# Patient Record
Sex: Female | Born: 1935 | Race: Black or African American | Hispanic: No | Smoking: Never smoker
Health system: Southern US, Community
[De-identification: ages and names within clinical notes are randomized; demographics above are authoritative.]

## PROBLEM LIST (undated history)

## (undated) DIAGNOSIS — K219 Gastro-esophageal reflux disease without esophagitis: Secondary | ICD-10-CM

## (undated) DIAGNOSIS — J9690 Respiratory failure, unspecified, unspecified whether with hypoxia or hypercapnia: Secondary | ICD-10-CM

## (undated) DIAGNOSIS — I10 Essential (primary) hypertension: Secondary | ICD-10-CM

## (undated) DIAGNOSIS — M4712 Other spondylosis with myelopathy, cervical region: Secondary | ICD-10-CM

## (undated) DIAGNOSIS — N390 Urinary tract infection, site not specified: Secondary | ICD-10-CM

## (undated) DIAGNOSIS — H4010X0 Unspecified open-angle glaucoma, stage unspecified: Secondary | ICD-10-CM

## (undated) DIAGNOSIS — M549 Dorsalgia, unspecified: Secondary | ICD-10-CM

## (undated) DIAGNOSIS — E559 Vitamin D deficiency, unspecified: Secondary | ICD-10-CM

## (undated) DIAGNOSIS — F0390 Unspecified dementia without behavioral disturbance: Secondary | ICD-10-CM

## (undated) DIAGNOSIS — F323 Major depressive disorder, single episode, severe with psychotic features: Secondary | ICD-10-CM

## (undated) DIAGNOSIS — G4700 Insomnia, unspecified: Secondary | ICD-10-CM

## (undated) DIAGNOSIS — E669 Obesity, unspecified: Secondary | ICD-10-CM

## (undated) DIAGNOSIS — E785 Hyperlipidemia, unspecified: Secondary | ICD-10-CM

## (undated) DIAGNOSIS — F209 Schizophrenia, unspecified: Secondary | ICD-10-CM

## (undated) DIAGNOSIS — M109 Gout, unspecified: Secondary | ICD-10-CM

## (undated) DIAGNOSIS — M1710 Unilateral primary osteoarthritis, unspecified knee: Secondary | ICD-10-CM

## (undated) DIAGNOSIS — N3020 Other chronic cystitis without hematuria: Secondary | ICD-10-CM

## (undated) DIAGNOSIS — H04129 Dry eye syndrome of unspecified lacrimal gland: Secondary | ICD-10-CM

## (undated) DIAGNOSIS — F329 Major depressive disorder, single episode, unspecified: Secondary | ICD-10-CM

## (undated) DIAGNOSIS — F32A Depression, unspecified: Secondary | ICD-10-CM

## (undated) DIAGNOSIS — D649 Anemia, unspecified: Secondary | ICD-10-CM

## (undated) DIAGNOSIS — M4716 Other spondylosis with myelopathy, lumbar region: Secondary | ICD-10-CM

## (undated) DIAGNOSIS — N19 Unspecified kidney failure: Secondary | ICD-10-CM

## (undated) DIAGNOSIS — L89899 Pressure ulcer of other site, unspecified stage: Secondary | ICD-10-CM

## (undated) DIAGNOSIS — S42209A Unspecified fracture of upper end of unspecified humerus, initial encounter for closed fracture: Secondary | ICD-10-CM

## (undated) DIAGNOSIS — J189 Pneumonia, unspecified organism: Secondary | ICD-10-CM

## (undated) DIAGNOSIS — G2581 Restless legs syndrome: Secondary | ICD-10-CM

## (undated) DIAGNOSIS — E039 Hypothyroidism, unspecified: Secondary | ICD-10-CM

## (undated) DIAGNOSIS — E875 Hyperkalemia: Secondary | ICD-10-CM

## (undated) DIAGNOSIS — G8929 Other chronic pain: Secondary | ICD-10-CM

## (undated) DIAGNOSIS — M25539 Pain in unspecified wrist: Secondary | ICD-10-CM

## (undated) DIAGNOSIS — S7292XA Unspecified fracture of left femur, initial encounter for closed fracture: Secondary | ICD-10-CM

## (undated) DIAGNOSIS — G252 Other specified forms of tremor: Secondary | ICD-10-CM

## (undated) DIAGNOSIS — G894 Chronic pain syndrome: Secondary | ICD-10-CM

## (undated) DIAGNOSIS — K649 Unspecified hemorrhoids: Secondary | ICD-10-CM

## (undated) DIAGNOSIS — M25579 Pain in unspecified ankle and joints of unspecified foot: Secondary | ICD-10-CM

## (undated) DIAGNOSIS — S72009A Fracture of unspecified part of neck of unspecified femur, initial encounter for closed fracture: Secondary | ICD-10-CM

## (undated) DIAGNOSIS — M353 Polymyalgia rheumatica: Secondary | ICD-10-CM

## (undated) HISTORY — DX: Vitamin D deficiency, unspecified: E55.9

## (undated) HISTORY — DX: Schizophrenia, unspecified: F20.9

## (undated) HISTORY — DX: Respiratory failure, unspecified, unspecified whether with hypoxia or hypercapnia: J96.90

## (undated) HISTORY — PX: UMBILICAL HERNIA REPAIR: SHX196

## (undated) HISTORY — DX: Unspecified open-angle glaucoma, stage unspecified: H40.10X0

## (undated) HISTORY — DX: Unspecified kidney failure: N19

## (undated) HISTORY — DX: Polymyalgia rheumatica: M35.3

## (undated) HISTORY — DX: Chronic pain syndrome: G89.4

## (undated) HISTORY — DX: Gastro-esophageal reflux disease without esophagitis: K21.9

## (undated) HISTORY — DX: Depression, unspecified: F32.A

## (undated) HISTORY — PX: POSTERIOR LAMINECTOMY / DECOMPRESSION LUMBAR SPINE: SUR740

## (undated) HISTORY — DX: Pneumonia, unspecified organism: J18.9

## (undated) HISTORY — DX: Urinary tract infection, site not specified: N39.0

## (undated) HISTORY — DX: Unilateral primary osteoarthritis, unspecified knee: M17.10

## (undated) HISTORY — DX: Pain in unspecified ankle and joints of unspecified foot: M25.579

## (undated) HISTORY — DX: Hyperlipidemia, unspecified: E78.5

## (undated) HISTORY — DX: Restless legs syndrome: G25.81

## (undated) HISTORY — DX: Pressure ulcer of other site, unspecified stage: L89.899

## (undated) HISTORY — DX: Essential (primary) hypertension: I10

## (undated) HISTORY — DX: Hyperkalemia: E87.5

## (undated) HISTORY — DX: Major depressive disorder, single episode, severe with psychotic features: F32.3

## (undated) HISTORY — DX: Unspecified dementia, unspecified severity, without behavioral disturbance, psychotic disturbance, mood disturbance, and anxiety: F03.90

## (undated) HISTORY — DX: Other specified forms of tremor: G25.2

## (undated) HISTORY — DX: Unspecified hemorrhoids: K64.9

## (undated) HISTORY — DX: Insomnia, unspecified: G47.00

## (undated) HISTORY — DX: Obesity, unspecified: E66.9

## (undated) HISTORY — DX: Dry eye syndrome of unspecified lacrimal gland: H04.129

## (undated) HISTORY — DX: Anemia, unspecified: D64.9

## (undated) HISTORY — DX: Fracture of unspecified part of neck of unspecified femur, initial encounter for closed fracture: S72.009A

## (undated) HISTORY — DX: Other spondylosis with myelopathy, cervical region: M47.12

## (undated) HISTORY — DX: Unspecified fracture of left femur, initial encounter for closed fracture: S72.92XA

## (undated) HISTORY — PX: TOTAL ABDOMINAL HYSTERECTOMY: SHX209

## (undated) HISTORY — DX: Major depressive disorder, single episode, unspecified: F32.9

## (undated) HISTORY — DX: Other spondylosis with myelopathy, lumbar region: M47.16

## (undated) HISTORY — DX: Hypothyroidism, unspecified: E03.9

## (undated) HISTORY — DX: Unspecified fracture of upper end of unspecified humerus, initial encounter for closed fracture: S42.209A

## (undated) HISTORY — DX: Gout, unspecified: M10.9

## (undated) HISTORY — DX: Other chronic pain: G89.29

## (undated) HISTORY — DX: Dorsalgia, unspecified: M54.9

## (undated) HISTORY — DX: Other chronic cystitis without hematuria: N30.20

## (undated) HISTORY — DX: Pain in unspecified wrist: M25.539

---

## 2000-01-22 ENCOUNTER — Encounter: Admission: RE | Admit: 2000-01-22 | Discharge: 2000-03-15 | Payer: Self-pay | Admitting: Family Medicine

## 2000-02-22 ENCOUNTER — Ambulatory Visit (HOSPITAL_COMMUNITY): Admission: RE | Admit: 2000-02-22 | Discharge: 2000-02-22 | Payer: Self-pay | Admitting: Family Medicine

## 2000-02-23 ENCOUNTER — Encounter: Payer: Self-pay | Admitting: Family Medicine

## 2000-07-12 ENCOUNTER — Encounter: Admission: RE | Admit: 2000-07-12 | Discharge: 2000-07-12 | Payer: Self-pay | Admitting: *Deleted

## 2000-07-12 ENCOUNTER — Encounter: Payer: Self-pay | Admitting: Allergy and Immunology

## 2002-04-12 ENCOUNTER — Ambulatory Visit (HOSPITAL_COMMUNITY): Admission: RE | Admit: 2002-04-12 | Discharge: 2002-04-12 | Payer: Self-pay | Admitting: Cardiology

## 2002-06-27 ENCOUNTER — Ambulatory Visit: Admission: RE | Admit: 2002-06-27 | Discharge: 2002-06-27 | Payer: Self-pay | Admitting: Orthopedic Surgery

## 2002-09-07 ENCOUNTER — Encounter: Payer: Self-pay | Admitting: Emergency Medicine

## 2002-09-07 ENCOUNTER — Emergency Department (HOSPITAL_COMMUNITY): Admission: EM | Admit: 2002-09-07 | Discharge: 2002-09-07 | Payer: Self-pay | Admitting: Emergency Medicine

## 2002-10-29 ENCOUNTER — Encounter: Payer: Self-pay | Admitting: Family Medicine

## 2002-10-29 ENCOUNTER — Ambulatory Visit (HOSPITAL_COMMUNITY): Admission: RE | Admit: 2002-10-29 | Discharge: 2002-10-29 | Payer: Self-pay | Admitting: Family Medicine

## 2002-11-13 ENCOUNTER — Encounter: Payer: Self-pay | Admitting: Family Medicine

## 2002-11-13 ENCOUNTER — Encounter (HOSPITAL_COMMUNITY): Admission: RE | Admit: 2002-11-13 | Discharge: 2002-12-13 | Payer: Self-pay | Admitting: Family Medicine

## 2003-12-30 ENCOUNTER — Ambulatory Visit (HOSPITAL_COMMUNITY): Admission: RE | Admit: 2003-12-30 | Discharge: 2003-12-30 | Payer: Self-pay | Admitting: Orthopedic Surgery

## 2004-03-13 ENCOUNTER — Ambulatory Visit (HOSPITAL_COMMUNITY): Admission: RE | Admit: 2004-03-13 | Discharge: 2004-03-13 | Payer: Self-pay | Admitting: Family Medicine

## 2004-03-23 ENCOUNTER — Ambulatory Visit (HOSPITAL_COMMUNITY): Admission: RE | Admit: 2004-03-23 | Discharge: 2004-03-23 | Payer: Self-pay | Admitting: Family Medicine

## 2004-05-27 ENCOUNTER — Ambulatory Visit (HOSPITAL_COMMUNITY): Admission: RE | Admit: 2004-05-27 | Discharge: 2004-05-27 | Payer: Self-pay | Admitting: Internal Medicine

## 2004-12-06 ENCOUNTER — Emergency Department (HOSPITAL_COMMUNITY): Admission: EM | Admit: 2004-12-06 | Discharge: 2004-12-07 | Payer: Self-pay | Admitting: Emergency Medicine

## 2005-05-21 ENCOUNTER — Inpatient Hospital Stay (HOSPITAL_COMMUNITY): Admission: EM | Admit: 2005-05-21 | Discharge: 2005-05-27 | Payer: Self-pay | Admitting: Orthopedic Surgery

## 2005-05-25 ENCOUNTER — Encounter: Payer: Self-pay | Admitting: Neurosurgery

## 2005-05-27 ENCOUNTER — Inpatient Hospital Stay (HOSPITAL_COMMUNITY)
Admission: RE | Admit: 2005-05-27 | Discharge: 2005-06-09 | Payer: Self-pay | Admitting: Physical Medicine & Rehabilitation

## 2005-05-27 ENCOUNTER — Ambulatory Visit: Payer: Self-pay | Admitting: Physical Medicine & Rehabilitation

## 2005-06-15 ENCOUNTER — Encounter: Admission: RE | Admit: 2005-06-15 | Discharge: 2005-06-15 | Payer: Self-pay | Admitting: Neurosurgery

## 2006-07-22 ENCOUNTER — Emergency Department (HOSPITAL_COMMUNITY): Admission: EM | Admit: 2006-07-22 | Discharge: 2006-07-23 | Payer: Self-pay | Admitting: Emergency Medicine

## 2007-03-06 ENCOUNTER — Encounter: Payer: Self-pay | Admitting: Vascular Surgery

## 2007-03-06 ENCOUNTER — Ambulatory Visit: Payer: Self-pay | Admitting: Vascular Surgery

## 2007-03-06 ENCOUNTER — Inpatient Hospital Stay (HOSPITAL_COMMUNITY): Admission: EM | Admit: 2007-03-06 | Discharge: 2007-03-10 | Payer: Self-pay | Admitting: *Deleted

## 2007-03-07 ENCOUNTER — Encounter (INDEPENDENT_AMBULATORY_CARE_PROVIDER_SITE_OTHER): Payer: Self-pay | Admitting: Cardiology

## 2007-04-25 ENCOUNTER — Inpatient Hospital Stay (HOSPITAL_COMMUNITY): Admission: EM | Admit: 2007-04-25 | Discharge: 2007-05-09 | Payer: Self-pay | Admitting: Emergency Medicine

## 2007-05-05 ENCOUNTER — Ambulatory Visit: Payer: Self-pay | Admitting: Physical Medicine & Rehabilitation

## 2007-05-09 ENCOUNTER — Inpatient Hospital Stay (HOSPITAL_COMMUNITY)
Admission: RE | Admit: 2007-05-09 | Discharge: 2007-05-22 | Payer: Self-pay | Admitting: Physical Medicine & Rehabilitation

## 2007-05-26 ENCOUNTER — Ambulatory Visit: Payer: Self-pay | Admitting: Family Medicine

## 2007-05-26 DIAGNOSIS — I10 Essential (primary) hypertension: Secondary | ICD-10-CM | POA: Insufficient documentation

## 2007-05-26 DIAGNOSIS — E669 Obesity, unspecified: Secondary | ICD-10-CM | POA: Insufficient documentation

## 2007-05-26 DIAGNOSIS — G2581 Restless legs syndrome: Secondary | ICD-10-CM

## 2007-05-26 DIAGNOSIS — M4716 Other spondylosis with myelopathy, lumbar region: Secondary | ICD-10-CM

## 2007-05-26 DIAGNOSIS — M109 Gout, unspecified: Secondary | ICD-10-CM

## 2007-05-26 DIAGNOSIS — IMO0002 Reserved for concepts with insufficient information to code with codable children: Secondary | ICD-10-CM

## 2007-05-26 DIAGNOSIS — M4712 Other spondylosis with myelopathy, cervical region: Secondary | ICD-10-CM

## 2007-05-26 DIAGNOSIS — M353 Polymyalgia rheumatica: Secondary | ICD-10-CM | POA: Insufficient documentation

## 2007-05-26 DIAGNOSIS — N3941 Urge incontinence: Secondary | ICD-10-CM

## 2007-05-26 DIAGNOSIS — D649 Anemia, unspecified: Secondary | ICD-10-CM

## 2007-05-26 DIAGNOSIS — E039 Hypothyroidism, unspecified: Secondary | ICD-10-CM | POA: Insufficient documentation

## 2007-05-26 DIAGNOSIS — M1710 Unilateral primary osteoarthritis, unspecified knee: Secondary | ICD-10-CM

## 2007-05-26 HISTORY — DX: Gout, unspecified: M10.9

## 2007-05-26 HISTORY — DX: Polymyalgia rheumatica: M35.3

## 2007-05-26 HISTORY — DX: Other spondylosis with myelopathy, lumbar region: M47.16

## 2007-05-26 HISTORY — DX: Anemia, unspecified: D64.9

## 2007-05-26 HISTORY — DX: Other spondylosis with myelopathy, cervical region: M47.12

## 2007-05-26 HISTORY — DX: Restless legs syndrome: G25.81

## 2007-05-26 HISTORY — DX: Obesity, unspecified: E66.9

## 2007-05-26 HISTORY — DX: Reserved for concepts with insufficient information to code with codable children: IMO0002

## 2007-06-14 ENCOUNTER — Ambulatory Visit: Payer: Self-pay | Admitting: Family Medicine

## 2007-06-14 ENCOUNTER — Encounter: Payer: Self-pay | Admitting: Family Medicine

## 2007-06-14 DIAGNOSIS — M549 Dorsalgia, unspecified: Secondary | ICD-10-CM

## 2007-06-14 HISTORY — DX: Dorsalgia, unspecified: M54.9

## 2007-06-20 ENCOUNTER — Encounter: Payer: Self-pay | Admitting: Pharmacist

## 2007-07-06 ENCOUNTER — Ambulatory Visit: Payer: Self-pay | Admitting: Physical Medicine & Rehabilitation

## 2007-07-06 ENCOUNTER — Encounter
Admission: RE | Admit: 2007-07-06 | Discharge: 2007-07-07 | Payer: Self-pay | Admitting: Physical Medicine & Rehabilitation

## 2007-07-19 ENCOUNTER — Ambulatory Visit: Payer: Self-pay | Admitting: Family Medicine

## 2007-07-23 ENCOUNTER — Telehealth (INDEPENDENT_AMBULATORY_CARE_PROVIDER_SITE_OTHER): Payer: Self-pay | Admitting: Family Medicine

## 2007-07-25 ENCOUNTER — Encounter: Payer: Self-pay | Admitting: Family Medicine

## 2007-08-20 ENCOUNTER — Telehealth (INDEPENDENT_AMBULATORY_CARE_PROVIDER_SITE_OTHER): Payer: Self-pay | Admitting: Family Medicine

## 2007-09-20 ENCOUNTER — Encounter: Payer: Self-pay | Admitting: Family Medicine

## 2007-09-20 ENCOUNTER — Ambulatory Visit: Payer: Self-pay | Admitting: Family Medicine

## 2007-09-20 DIAGNOSIS — K5900 Constipation, unspecified: Secondary | ICD-10-CM | POA: Insufficient documentation

## 2007-09-22 ENCOUNTER — Encounter (INDEPENDENT_AMBULATORY_CARE_PROVIDER_SITE_OTHER): Payer: Self-pay | Admitting: Family Medicine

## 2007-10-18 ENCOUNTER — Encounter: Payer: Self-pay | Admitting: Family Medicine

## 2007-11-09 HISTORY — PX: ORIF HIP FRACTURE: SHX2125

## 2007-11-10 ENCOUNTER — Encounter: Payer: Self-pay | Admitting: Family Medicine

## 2007-11-23 ENCOUNTER — Encounter: Payer: Self-pay | Admitting: Family Medicine

## 2007-11-23 DIAGNOSIS — N19 Unspecified kidney failure: Secondary | ICD-10-CM

## 2007-11-23 HISTORY — DX: Unspecified kidney failure: N19

## 2007-12-01 ENCOUNTER — Encounter (INDEPENDENT_AMBULATORY_CARE_PROVIDER_SITE_OTHER): Payer: Self-pay | Admitting: Family Medicine

## 2007-12-08 ENCOUNTER — Encounter: Payer: Self-pay | Admitting: Family Medicine

## 2007-12-08 DIAGNOSIS — Z593 Problems related to living in residential institution: Secondary | ICD-10-CM

## 2008-01-10 ENCOUNTER — Ambulatory Visit: Payer: Self-pay | Admitting: Family Medicine

## 2008-01-31 ENCOUNTER — Encounter: Payer: Self-pay | Admitting: *Deleted

## 2008-02-06 ENCOUNTER — Encounter (INDEPENDENT_AMBULATORY_CARE_PROVIDER_SITE_OTHER): Payer: Self-pay | Admitting: Family Medicine

## 2008-02-11 ENCOUNTER — Telehealth (INDEPENDENT_AMBULATORY_CARE_PROVIDER_SITE_OTHER): Payer: Self-pay | Admitting: *Deleted

## 2008-02-13 ENCOUNTER — Ambulatory Visit (HOSPITAL_COMMUNITY): Admission: RE | Admit: 2008-02-13 | Discharge: 2008-02-13 | Payer: Self-pay | Admitting: Family Medicine

## 2008-02-13 ENCOUNTER — Ambulatory Visit: Payer: Self-pay | Admitting: Family Medicine

## 2008-02-13 ENCOUNTER — Encounter: Payer: Self-pay | Admitting: Family Medicine

## 2008-02-13 ENCOUNTER — Inpatient Hospital Stay (HOSPITAL_COMMUNITY): Admission: EM | Admit: 2008-02-13 | Discharge: 2008-02-20 | Payer: Self-pay | Admitting: Emergency Medicine

## 2008-02-20 ENCOUNTER — Telehealth: Payer: Self-pay | Admitting: Family Medicine

## 2008-02-28 ENCOUNTER — Encounter: Payer: Self-pay | Admitting: Family Medicine

## 2008-02-29 ENCOUNTER — Encounter: Payer: Self-pay | Admitting: Family Medicine

## 2008-02-29 DIAGNOSIS — S72009A Fracture of unspecified part of neck of unspecified femur, initial encounter for closed fracture: Secondary | ICD-10-CM

## 2008-02-29 HISTORY — DX: Fracture of unspecified part of neck of unspecified femur, initial encounter for closed fracture: S72.009A

## 2008-03-08 ENCOUNTER — Telehealth (INDEPENDENT_AMBULATORY_CARE_PROVIDER_SITE_OTHER): Payer: Self-pay | Admitting: Family Medicine

## 2008-03-17 IMAGING — CR DG WRIST COMPLETE 3+V*R*
4 series · 4 of 4 positions shown · non-contrast
Comparison: none

CLINICAL DATA: Pain, injury, fracture.
RIGHT HAND ? 3 VIEWS ? 05/02/07:

[view not recorded (1 of 4)]
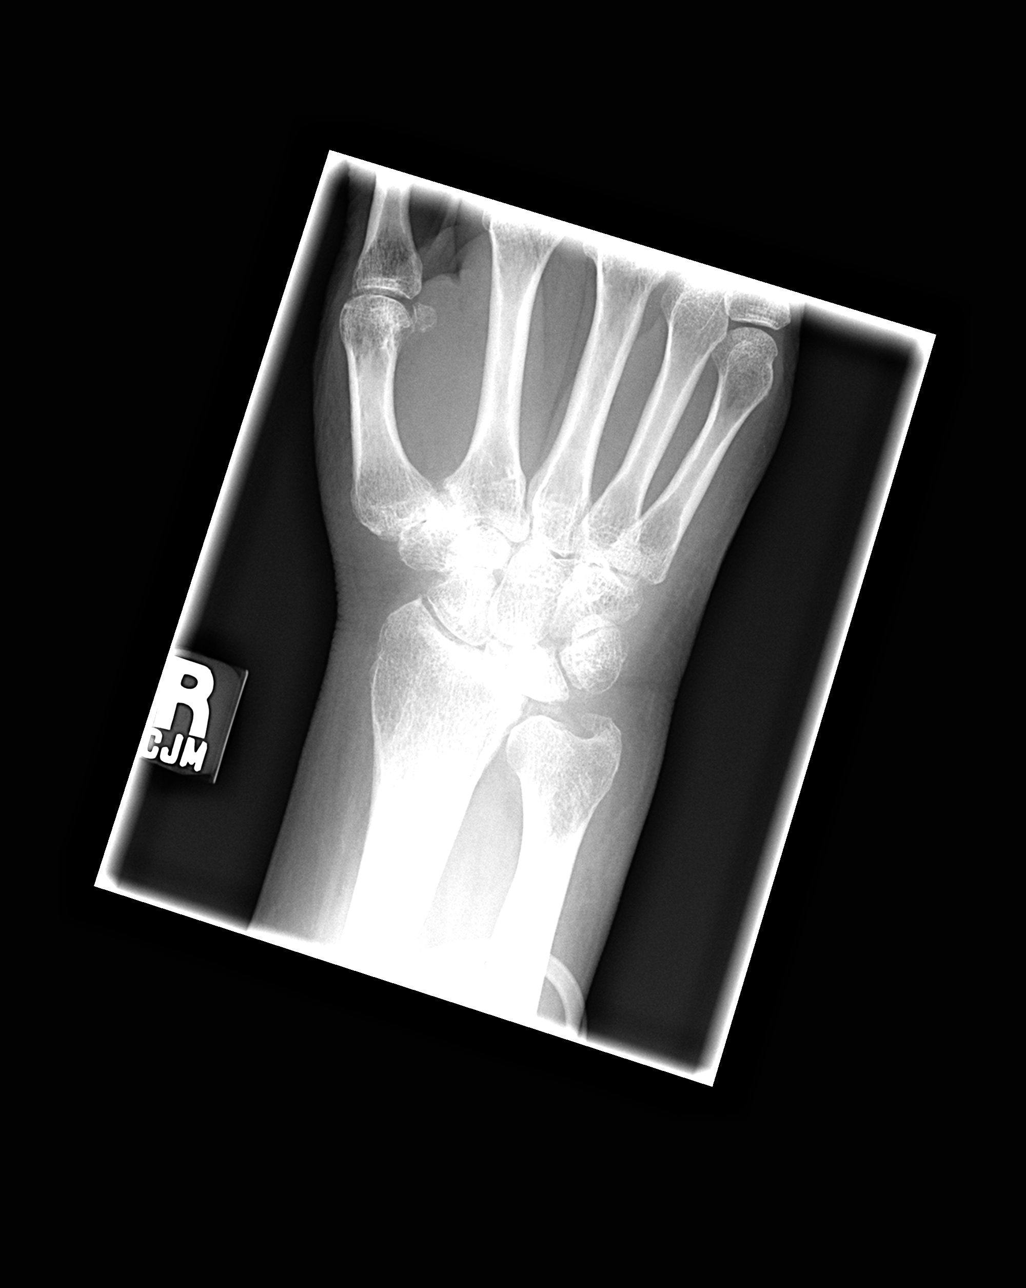

[view not recorded (2 of 4)]
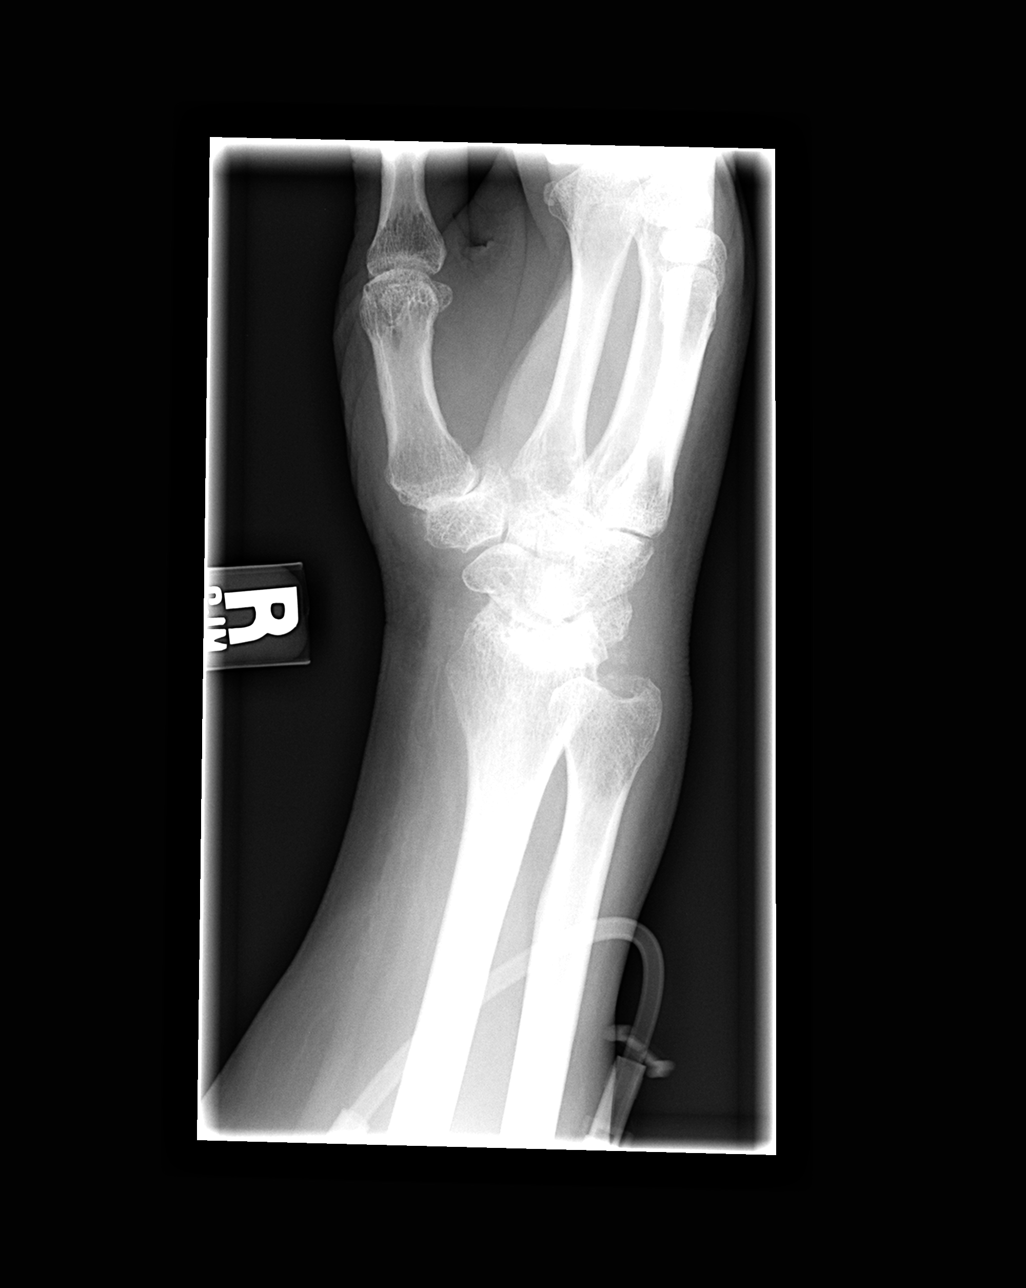

[view not recorded (3 of 4)]
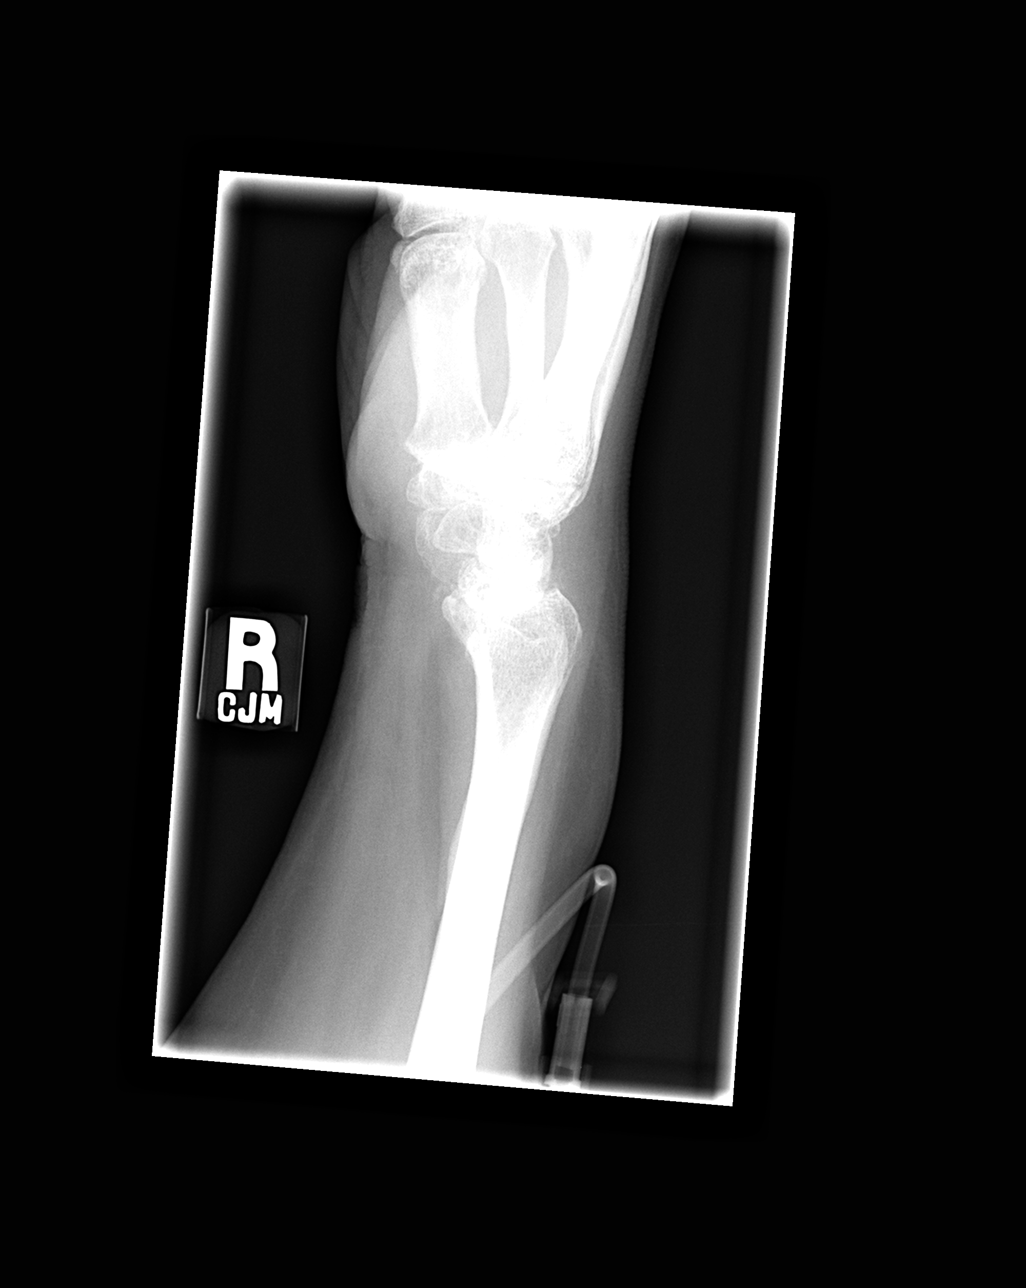

[view not recorded (4 of 4)]
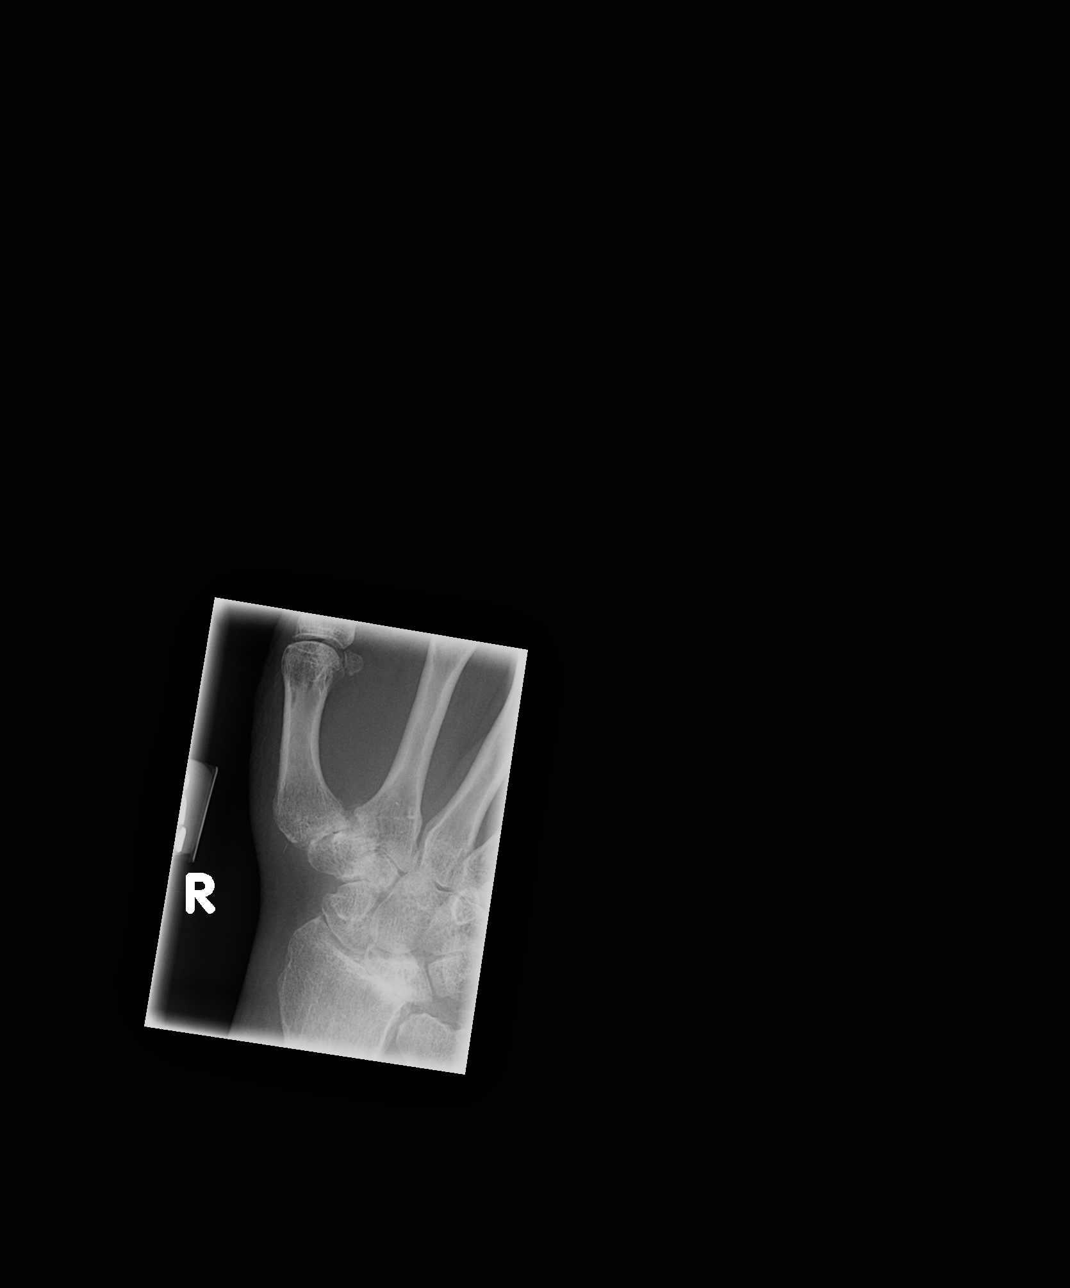

[4 of 4 positions shown; findings below may reference images not displayed]

FINDINGS: No acute bone or joint abnormality is identified.  The patient has marked degenerative change about the carpus with severe first CMC and radiocarpal arthritis.  There is chondrocalcinosis about the wrist.
IMPRESSION: No acute finding with degenerative change about the carpus.
RIGHT WRIST ? 4 VIEWS ? 05/02/07:
FINDINGS: Chondrocalcinosis is noted.  Severe radiocarpal, first CMC, scaphotrapezium - trapezoid joint osteoarthritis noted. No fracture.
IMPRESSION: No acute finding with severe degenerative change and chondrocalcinosis noted.

## 2008-03-18 ENCOUNTER — Encounter: Payer: Self-pay | Admitting: Family Medicine

## 2008-04-03 ENCOUNTER — Ambulatory Visit: Payer: Self-pay | Admitting: Internal Medicine

## 2008-05-06 ENCOUNTER — Encounter: Payer: Self-pay | Admitting: Family Medicine

## 2008-05-22 ENCOUNTER — Encounter (INDEPENDENT_AMBULATORY_CARE_PROVIDER_SITE_OTHER): Payer: Self-pay | Admitting: Family Medicine

## 2008-05-23 ENCOUNTER — Telehealth: Payer: Self-pay | Admitting: Family Medicine

## 2008-05-31 ENCOUNTER — Encounter: Payer: Self-pay | Admitting: Family Medicine

## 2008-05-31 DIAGNOSIS — N390 Urinary tract infection, site not specified: Secondary | ICD-10-CM

## 2008-05-31 HISTORY — DX: Urinary tract infection, site not specified: N39.0

## 2008-06-09 ENCOUNTER — Telehealth (INDEPENDENT_AMBULATORY_CARE_PROVIDER_SITE_OTHER): Payer: Self-pay | Admitting: Family Medicine

## 2008-06-11 ENCOUNTER — Encounter (INDEPENDENT_AMBULATORY_CARE_PROVIDER_SITE_OTHER): Payer: Self-pay | Admitting: Family Medicine

## 2008-06-13 ENCOUNTER — Encounter: Payer: Self-pay | Admitting: Family Medicine

## 2008-06-21 ENCOUNTER — Ambulatory Visit: Payer: Self-pay | Admitting: Family Medicine

## 2008-06-21 ENCOUNTER — Encounter: Payer: Self-pay | Admitting: Family Medicine

## 2008-06-24 ENCOUNTER — Telehealth (INDEPENDENT_AMBULATORY_CARE_PROVIDER_SITE_OTHER): Payer: Self-pay | Admitting: Family Medicine

## 2008-07-02 ENCOUNTER — Telehealth (INDEPENDENT_AMBULATORY_CARE_PROVIDER_SITE_OTHER): Payer: Self-pay | Admitting: Family Medicine

## 2008-07-08 ENCOUNTER — Encounter: Payer: Self-pay | Admitting: Family Medicine

## 2008-07-08 DIAGNOSIS — F0390 Unspecified dementia without behavioral disturbance: Secondary | ICD-10-CM

## 2008-07-19 ENCOUNTER — Encounter (INDEPENDENT_AMBULATORY_CARE_PROVIDER_SITE_OTHER): Payer: Self-pay | Admitting: Family Medicine

## 2008-08-21 ENCOUNTER — Ambulatory Visit: Payer: Self-pay | Admitting: Family Medicine

## 2008-08-21 ENCOUNTER — Encounter: Payer: Self-pay | Admitting: Family Medicine

## 2008-08-21 DIAGNOSIS — E559 Vitamin D deficiency, unspecified: Secondary | ICD-10-CM | POA: Insufficient documentation

## 2008-08-21 HISTORY — DX: Vitamin D deficiency, unspecified: E55.9

## 2008-08-29 ENCOUNTER — Encounter: Payer: Self-pay | Admitting: Family Medicine

## 2008-08-29 DIAGNOSIS — G2401 Drug induced subacute dyskinesia: Secondary | ICD-10-CM | POA: Insufficient documentation

## 2008-09-07 ENCOUNTER — Telehealth: Payer: Self-pay | Admitting: Family Medicine

## 2008-09-12 ENCOUNTER — Encounter: Payer: Self-pay | Admitting: Family Medicine

## 2008-09-12 DIAGNOSIS — F32A Depression, unspecified: Secondary | ICD-10-CM | POA: Insufficient documentation

## 2008-09-12 DIAGNOSIS — F323 Major depressive disorder, single episode, severe with psychotic features: Secondary | ICD-10-CM

## 2008-09-12 HISTORY — DX: Depression, unspecified: F32.A

## 2008-09-12 HISTORY — DX: Major depressive disorder, single episode, severe with psychotic features: F32.3

## 2008-09-18 ENCOUNTER — Encounter: Payer: Self-pay | Admitting: Family Medicine

## 2008-09-20 ENCOUNTER — Encounter (INDEPENDENT_AMBULATORY_CARE_PROVIDER_SITE_OTHER): Payer: Self-pay | Admitting: Family Medicine

## 2008-09-20 LAB — CONVERTED CEMR LAB: Triglyceride fasting, serum: 96 mg/dL

## 2008-09-23 ENCOUNTER — Encounter (INDEPENDENT_AMBULATORY_CARE_PROVIDER_SITE_OTHER): Payer: Self-pay | Admitting: Family Medicine

## 2008-09-25 ENCOUNTER — Encounter: Payer: Self-pay | Admitting: Family Medicine

## 2008-09-30 ENCOUNTER — Encounter: Payer: Self-pay | Admitting: Family Medicine

## 2008-11-13 ENCOUNTER — Ambulatory Visit: Payer: Self-pay | Admitting: Family Medicine

## 2008-11-22 ENCOUNTER — Encounter: Payer: Self-pay | Admitting: Family Medicine

## 2008-12-18 ENCOUNTER — Ambulatory Visit: Payer: Self-pay | Admitting: Family Medicine

## 2008-12-18 ENCOUNTER — Encounter: Payer: Self-pay | Admitting: Family Medicine

## 2009-01-29 ENCOUNTER — Encounter: Payer: Self-pay | Admitting: Family Medicine

## 2009-01-29 LAB — CONVERTED CEMR LAB
Hgb A1c MFr Bld: 5.8 %
Potassium: 3.8 meq/L
Sodium: 148 meq/L

## 2009-01-30 ENCOUNTER — Encounter (INDEPENDENT_AMBULATORY_CARE_PROVIDER_SITE_OTHER): Payer: Self-pay | Admitting: Family Medicine

## 2009-01-30 LAB — CONVERTED CEMR LAB: TSH: 1.182 microintl units/mL

## 2009-03-12 ENCOUNTER — Encounter: Payer: Self-pay | Admitting: Family Medicine

## 2009-03-12 ENCOUNTER — Ambulatory Visit: Payer: Self-pay | Admitting: Family Medicine

## 2009-03-12 DIAGNOSIS — E785 Hyperlipidemia, unspecified: Secondary | ICD-10-CM

## 2009-03-12 HISTORY — DX: Hyperlipidemia, unspecified: E78.5

## 2009-05-05 ENCOUNTER — Encounter: Payer: Self-pay | Admitting: Family Medicine

## 2009-05-07 ENCOUNTER — Encounter: Payer: Self-pay | Admitting: Family Medicine

## 2009-05-14 ENCOUNTER — Encounter: Payer: Self-pay | Admitting: Family Medicine

## 2009-05-19 ENCOUNTER — Encounter: Payer: Self-pay | Admitting: Family Medicine

## 2009-06-04 ENCOUNTER — Ambulatory Visit: Payer: Self-pay | Admitting: Family Medicine

## 2009-06-26 ENCOUNTER — Encounter (INDEPENDENT_AMBULATORY_CARE_PROVIDER_SITE_OTHER): Payer: Self-pay | Admitting: Pharmacist

## 2009-06-30 ENCOUNTER — Encounter: Payer: Self-pay | Admitting: Pharmacist

## 2009-07-03 ENCOUNTER — Encounter: Payer: Self-pay | Admitting: Family Medicine

## 2009-07-30 ENCOUNTER — Encounter: Payer: Self-pay | Admitting: Pharmacist

## 2009-08-08 DIAGNOSIS — S42209A Unspecified fracture of upper end of unspecified humerus, initial encounter for closed fracture: Secondary | ICD-10-CM

## 2009-08-08 HISTORY — DX: Unspecified fracture of upper end of unspecified humerus, initial encounter for closed fracture: S42.209A

## 2009-08-15 HISTORY — PX: OTHER SURGICAL HISTORY: SHX169

## 2009-08-18 ENCOUNTER — Encounter: Payer: Self-pay | Admitting: Family Medicine

## 2009-09-04 ENCOUNTER — Encounter: Payer: Self-pay | Admitting: Pharmacist

## 2009-09-24 ENCOUNTER — Encounter: Payer: Self-pay | Admitting: Pharmacist

## 2009-09-28 ENCOUNTER — Encounter: Payer: Self-pay | Admitting: Family Medicine

## 2009-10-08 ENCOUNTER — Encounter: Payer: Self-pay | Admitting: Family Medicine

## 2009-10-08 ENCOUNTER — Ambulatory Visit: Payer: Self-pay | Admitting: Family Medicine

## 2009-10-08 DIAGNOSIS — M25539 Pain in unspecified wrist: Secondary | ICD-10-CM

## 2009-10-08 HISTORY — DX: Pain in unspecified wrist: M25.539

## 2009-11-05 ENCOUNTER — Encounter: Payer: Self-pay | Admitting: Family Medicine

## 2009-11-14 ENCOUNTER — Encounter: Payer: Self-pay | Admitting: Family Medicine

## 2009-11-18 ENCOUNTER — Encounter: Payer: Self-pay | Admitting: Family Medicine

## 2009-11-19 ENCOUNTER — Encounter: Payer: Self-pay | Admitting: Pharmacist

## 2009-11-26 ENCOUNTER — Encounter: Payer: Self-pay | Admitting: Pharmacist

## 2009-12-06 ENCOUNTER — Encounter: Payer: Self-pay | Admitting: Family Medicine

## 2009-12-06 LAB — CONVERTED CEMR LAB
HDL: 30 mg/dL
LDL Cholesterol: 53 mg/dL
Triglyceride fasting, serum: 189 mg/dL

## 2009-12-10 ENCOUNTER — Ambulatory Visit: Payer: Self-pay | Admitting: Family Medicine

## 2009-12-20 ENCOUNTER — Encounter: Payer: Self-pay | Admitting: Family Medicine

## 2009-12-20 LAB — CONVERTED CEMR LAB
Creatinine, Ser: 0.97 mg/dL
Glucose, Urine, Semiquant: 80
Potassium: 4.2 meq/L
Sodium: 141 meq/L

## 2009-12-25 ENCOUNTER — Encounter: Payer: Self-pay | Admitting: Pharmacist

## 2009-12-31 ENCOUNTER — Ambulatory Visit: Payer: Self-pay | Admitting: Family Medicine

## 2010-02-11 ENCOUNTER — Encounter: Payer: Self-pay | Admitting: Pharmacist

## 2010-02-18 ENCOUNTER — Encounter: Payer: Self-pay | Admitting: Family Medicine

## 2010-02-18 DIAGNOSIS — G894 Chronic pain syndrome: Secondary | ICD-10-CM

## 2010-02-18 HISTORY — DX: Chronic pain syndrome: G89.4

## 2010-02-20 ENCOUNTER — Encounter: Payer: Self-pay | Admitting: Family Medicine

## 2010-03-04 ENCOUNTER — Encounter: Payer: Self-pay | Admitting: Pharmacist

## 2010-03-11 ENCOUNTER — Encounter: Payer: Self-pay | Admitting: Pharmacist

## 2010-04-15 ENCOUNTER — Encounter: Payer: Self-pay | Admitting: Pharmacist

## 2010-04-29 ENCOUNTER — Ambulatory Visit: Payer: Self-pay | Admitting: Family Medicine

## 2010-05-05 ENCOUNTER — Encounter: Payer: Self-pay | Admitting: Family Medicine

## 2010-05-05 DIAGNOSIS — K649 Unspecified hemorrhoids: Secondary | ICD-10-CM

## 2010-05-05 HISTORY — DX: Unspecified hemorrhoids: K64.9

## 2010-05-26 ENCOUNTER — Encounter: Payer: Self-pay | Admitting: Family Medicine

## 2010-05-26 LAB — CONVERTED CEMR LAB
Hgb A1c MFr Bld: 5.1 %
TSH: 1.257 microintl units/mL

## 2010-06-10 ENCOUNTER — Encounter: Payer: Self-pay | Admitting: Family Medicine

## 2010-06-10 DIAGNOSIS — L89899 Pressure ulcer of other site, unspecified stage: Secondary | ICD-10-CM

## 2010-06-10 HISTORY — DX: Pressure ulcer of other site, unspecified stage: L89.899

## 2010-06-29 ENCOUNTER — Encounter: Payer: Self-pay | Admitting: Family Medicine

## 2010-06-29 LAB — CONVERTED CEMR LAB
Cholesterol: 140 mg/dL
HDL: 40 mg/dL
LDL Cholesterol: 78 mg/dL
Triglycerides: 108 mg/dL

## 2010-07-10 ENCOUNTER — Encounter: Payer: Self-pay | Admitting: Family Medicine

## 2010-07-10 LAB — CONVERTED CEMR LAB
BUN: 36 mg/dL
CO2: 21 meq/L
Chloride: 112 meq/L
Creatinine, Ser: 1.09 mg/dL
Glucose, Bld: 99 mg/dL
HCT: 36.8 %
Hemoglobin: 11.2 g/dL
Platelets: 192 10*3/uL
Potassium: 4.5 meq/L
Sodium: 141 meq/L
WBC: 4.9 10*3/uL

## 2010-07-28 ENCOUNTER — Encounter: Payer: Self-pay | Admitting: Pharmacist

## 2010-08-02 ENCOUNTER — Encounter: Payer: Self-pay | Admitting: Family Medicine

## 2010-08-02 DIAGNOSIS — H4010X0 Unspecified open-angle glaucoma, stage unspecified: Secondary | ICD-10-CM

## 2010-08-02 HISTORY — DX: Unspecified open-angle glaucoma, stage unspecified: H40.10X0

## 2010-09-21 ENCOUNTER — Encounter: Payer: Self-pay | Admitting: Pharmacist

## 2010-09-30 ENCOUNTER — Encounter: Payer: Self-pay | Admitting: Pharmacist

## 2010-10-07 ENCOUNTER — Encounter: Payer: Self-pay | Admitting: Pharmacist

## 2010-10-16 ENCOUNTER — Encounter: Payer: Self-pay | Admitting: Family Medicine

## 2010-10-20 ENCOUNTER — Telehealth: Payer: Self-pay | Admitting: Family Medicine

## 2010-10-21 ENCOUNTER — Encounter: Payer: Self-pay | Admitting: Pharmacist

## 2010-10-21 ENCOUNTER — Encounter: Payer: Self-pay | Admitting: Family Medicine

## 2010-11-27 ENCOUNTER — Encounter: Payer: Self-pay | Admitting: Pharmacist

## 2010-11-28 ENCOUNTER — Encounter: Payer: Self-pay | Admitting: Orthopedic Surgery

## 2010-11-29 ENCOUNTER — Encounter: Payer: Self-pay | Admitting: Family Medicine

## 2010-12-09 ENCOUNTER — Encounter: Payer: Self-pay | Admitting: Pharmacist

## 2010-12-10 NOTE — Consult Note (Signed)
Summary: [facility]  [facility]   Imported By: De Nurse 10/15/2009 11:16:51  _____________________________________________________________________  External Attachment:    Type:   Image     Comment:   External Document

## 2010-12-10 NOTE — Assessment & Plan Note (Signed)
Summary: NH visit     Primary Care Provider:  Helane Rima DO   History of Present Illness: No complaints until her extremities are moved and then she complains of  pain  No problems reported by nursing   Physical Exam  Lungs:  Normal respiratory effort and normal breath sounds.   Heart:  Normal rate, regular rhythm, and no murmur.   Abdomen:  Soft, non-tender, and no distention.  Obese. Msk:  Tender but less boggy area over distal right wrist. Painful for her to move.   complains of  pain with movement of legs Extremities:  Trace edema, feet warm, venous stasis changes, no open lesions. Psych:  Sleepy, good eye contact.flat affect and subdued.      Prevention & Chronic Care Immunizations   Influenza vaccine: given  (08/12/2008)   Influenza vaccine due: 08/12/2009    Tetanus booster: Not documented    Pneumococcal vaccine: Not documented    H. zoster vaccine: Not documented  Colorectal Screening   Hemoccult: Not documented   Hemoccult due: Not Indicated    Colonoscopy: Not documented   Colonoscopy due: Not Indicated  Other Screening   Pap smear: Not documented   Pap smear due: Not Indicated    Mammogram: Not documented   Mammogram due: Not Indicated    DXA bone density scan: Not documented   DXA scan due: Not Indicated    Smoking status: quit  (01/29/2009)  Lipids   Total Cholesterol: Not documented   LDL: 53  (12/06/2009)   LDL Direct: Not documented   HDL: 30  (12/06/2009)   Triglycerides: Not documented    SGOT (AST): Not documented   SGPT (ALT): Not documented   Alkaline phosphatase: Not documented   Total bilirubin: Not documented  Hypertension   Last Blood Pressure: 124 / 86  (03/12/2009)   Serum creatinine: 0.97  (12/20/2009)   Serum potassium 4.2  (12/20/2009)  Self-Management Support :    Hypertension self-management support: Not documented    Lipid self-management support: Not documented

## 2010-12-10 NOTE — Miscellaneous (Signed)
Summary: diarrhea   Primary Care Provider:  Helane Rima DO  CC:  diarrhea.  History of Present Illness: Called by nursing regarding diarrhea. some confusion of duration. reported as "chronic" but noted in progress notes only on 12/15 and 1/7. several loose stools. no comment of whether blood in stool. patient has been requiring IVFs recently, but according to progress notes, seems to be more for decreased oral intake and not diarrhea. patient endorses loose stools, abdominal pain, and L shoulder pain. no nausea, vomiting, fever, chills. she has been receiving immodium several times daily without relief. no recent antibiotic course. she was recently changed from allopurinol to uloric. she also had her colchicine increased last month.   Current Medications (verified): 1)  Lipitor 40 Mg Tabs (Atorvastatin Calcium) .... Daily 2)  Norvasc 10 Mg  Tabs (Amlodipine Besylate) .... Daily 3)  Synthroid 50 Mcg  Tabs (Levothyroxine Sodium) .... Once Daily 4)  Therapeutic Multivitamin   Tabs (Multiple Vitamin) .... Once Daily 5)  Colchicine 0.6 Mg  Tabs (Colchicine) .Marland Kitchen.. 1 By Mouth Qd 6)  Apap 500 Mg  Caps (Acetaminophen) .Marland Kitchen.. 1000 Mg Three Times A Day 7)  Allopurinol 100 Mg  Tabs (Allopurinol) .... 2 Tablets Daily 8)  Miralax   Powd (Polyethylene Glycol 3350) .Marland Kitchen.. 17g By Mouth Daily 9)  Oxycodone Hcl 5 Mg  Tabs (Oxycodone Hcl) .... Take 1 Tab Q 4hr As Needed Pain 10)  Furosemide 20 Mg  Tabs (Furosemide) .... 2 Tablets Once Daily 11)  Alendronate Sodium 70 Mg  Tabs (Alendronate Sodium) .... One Q Week 12)  Klor-Con M20 20 Meq  Tbcr (Potassium Chloride Crys Cr) .... One Daily 13)  Gabapentin 300 Mg  Caps (Gabapentin) .Marland Kitchen.. 1 By Mouth Tid 14)  Seroquel 100 Mg Tabs (Quetiapine Fumarate) .... 50mg  in The Am  100mg  in The Pm 15)  Zoloft 100 Mg Tabs (Sertraline Hcl) .... 150mg  Daily Take 1 and 1/2 Tablets 16)  Oscal 500/200 D-3 500-200 Mg-Unit  Tabs (Calcium-Vitamin D) .... One Two Times A Day 17)  Aricept  10 Mg Tabs (Donepezil Hcl) .... One By Mouth Q Hs 18)  Loperamide Hcl 2 Mg Tabs (Loperamide Hcl) .... As Needed 19)  Aspirin 81 Mg Tbec (Aspirin) .... Once Daily 20)  Omeprazole 20 Mg Cpdr (Omeprazole) .... Two Daily (40mg ) 21)  Vitamin D (Ergocalciferol) 50000 Unit Caps (Ergocalciferol) .... Once Monthly 22)  Oxycodone Hcl 10 Mg Tabs (Oxycodone Hcl) .... Q4 Hours For Moderate To Severe Pain. 23)  Trazodone Hcl 50 Mg Tabs (Trazodone Hcl) .... At Bedtime 24)  Resource Beneprotein  Pack (Protein) .Marland Kitchen.. 1 Scoop Three Times A Day 25)  Colchicine 0.6 Mg Tabs (Colchicine) .Marland Kitchen.. 1 Tab By Mouth Two Times A Day For Gout.  Allergies (verified): 1)  Hydrochlorothiazide  Physical Exam  General:  NAD, sitting in chair at bedside. doesn't appear to feel well. Mouth:  dry mucous membranes Lungs:  normal respiratory effort and normal breath sounds.   Heart:  normal rate, regular rhythm, and no murmur.   Abdomen:  soft, +TTP in RUQ and LLQ. no rebound or guarding. +BS.   Impression & Recommendations:  Problem # 1:  DIARRHEA (ICD-787.91) Assessment New ?acute vs. sub-acute. will check KUB for large stool burden, check stool for C. diff x3, hemeoccult stool, check fecal lactoferrin. will also check CMP. decrease colchicine to 0.6 mg daily. also, patient with h/o diverticulosis, so monitor for signs of diverticulitis.   Her updated medication list for this problem includes:  Loperamide Hcl 2 Mg Tabs (Loperamide hcl) .Marland Kitchen... As needed  Complete Medication List: 1)  Lipitor 40 Mg Tabs (Atorvastatin calcium) .... Daily 2)  Norvasc 10 Mg Tabs (Amlodipine besylate) .... Daily 3)  Synthroid 50 Mcg Tabs (Levothyroxine sodium) .... Once daily 4)  Therapeutic Multivitamin Tabs (Multiple vitamin) .... Once daily 5)  Colchicine 0.6 Mg Tabs (Colchicine) .Marland Kitchen.. 1 by mouth qd 6)  Apap 500 Mg Caps (Acetaminophen) .Marland Kitchen.. 1000 mg three times a day 7)  Allopurinol 100 Mg Tabs (Allopurinol) .... 2 tablets daily 8)   Miralax Powd (Polyethylene glycol 3350) .Marland Kitchen.. 17g by mouth daily 9)  Oxycodone Hcl 5 Mg Tabs (Oxycodone hcl) .... Take 1 tab q 4hr as needed pain 10)  Furosemide 20 Mg Tabs (Furosemide) .... 2 tablets once daily 11)  Alendronate Sodium 70 Mg Tabs (Alendronate sodium) .... One q week 12)  Klor-con M20 20 Meq Tbcr (Potassium chloride crys cr) .... One daily 13)  Gabapentin 300 Mg Caps (Gabapentin) .Marland Kitchen.. 1 by mouth tid 14)  Seroquel 100 Mg Tabs (Quetiapine fumarate) .... 50mg  in the am  100mg  in the pm 15)  Zoloft 100 Mg Tabs (Sertraline hcl) .... 150mg  daily take 1 and 1/2 tablets 16)  Oscal 500/200 D-3 500-200 Mg-unit Tabs (Calcium-vitamin d) .... One two times a day 17)  Aricept 10 Mg Tabs (Donepezil hcl) .... One by mouth q hs 18)  Loperamide Hcl 2 Mg Tabs (Loperamide hcl) .... As needed 19)  Aspirin 81 Mg Tbec (Aspirin) .... Once daily 20)  Omeprazole 20 Mg Cpdr (Omeprazole) .... Two daily (40mg ) 21)  Vitamin D (ergocalciferol) 50000 Unit Caps (Ergocalciferol) .... Once monthly 22)  Oxycodone Hcl 10 Mg Tabs (Oxycodone hcl) .... Q4 hours for moderate to severe pain. 23)  Trazodone Hcl 50 Mg Tabs (Trazodone hcl) .... At bedtime 24)  Resource Beneprotein Pack (Protein) .Marland Kitchen.. 1 scoop three times a day 25)  Colchicine 0.6 Mg Tabs (Colchicine) .Marland Kitchen.. 1 tab by mouth two times a day for gout.

## 2010-12-10 NOTE — Miscellaneous (Signed)
Summary: Med Update - Rx  Clinical Lists Changes  Medications: Added new medication of MEGACE ORAL 40 MG/ML SUSP (MEGESTROL ACETATE) 20ml (800mg ) daily - Signed Changed medication from GABAPENTIN 300 MG  CAPS (GABAPENTIN) 1 by mouth tid to GABAPENTIN 300 MG  CAPS (GABAPENTIN) 2 by mouth tid - Signed Changed medication from OXYCODONE HCL 5 MG  TABS (OXYCODONE HCL) Take 2 tab two times a day to OXYCODONE HCL 5 MG  TABS (OXYCODONE HCL) q NOON and q4:00PM and MiDNIGHT if needed Added new medication of OXYCONTIN 10 MG XR12H-TAB (OXYCODONE HCL) 1 two times a day - Signed Rx of MEGACE ORAL 40 MG/ML SUSP (MEGESTROL ACETATE) 20ml (800mg ) daily;  #1 x 0;  Signed;  Entered by: Christian Mate D;  Authorized by: Madelon Lips Pharm D;  Method used: Historical Rx of GABAPENTIN 300 MG  CAPS (GABAPENTIN) 2 by mouth tid;  #1 x 0;  Signed;  Entered by: Christian Mate D;  Authorized by: Madelon Lips Pharm D;  Method used: Historical Rx of OXYCONTIN 10 MG XR12H-TAB (OXYCODONE HCL) 1 two times a day;  #1 x 0;  Signed;  Entered by: Christian Mate D;  Authorized by: Madelon Lips Pharm D;  Method used: Historical    Prescriptions: OXYCONTIN 10 MG XR12H-TAB (OXYCODONE HCL) 1 two times a day  #1 x 0   Entered and Authorized by:   Christian Mate D   Signed by:   Madelon Lips Pharm D on 03/04/2010   Method used:   Historical   RxID:   [id number] GABAPENTIN 300 MG  CAPS (GABAPENTIN) 2 by mouth tid  #1 x 0   Entered and Authorized by:   Christian Mate D   Signed by:   Madelon Lips Pharm D on 03/04/2010   Method used:   Historical   RxID:   [id number] MEGACE ORAL 40 MG/ML SUSP (MEGESTROL ACETATE) 20ml (800mg ) daily  #1 x 0   Entered and Authorized by:   Christian Mate D   Signed by:   Madelon Lips Pharm D on 03/04/2010   Method used:   Historical   RxID:   [id number]

## 2010-12-10 NOTE — Assessment & Plan Note (Signed)
Summary: PCP Nursing Home Visit   Primary Care Provider:  Helane Rima DO  CC:  NH Visit by PCP.  History of Present Illness: Acute Issues:  1. Diarrhea: See note by Dr. Lanier Prude 11/14/09.   Chronic Issues: 1. Leg edema with pain:  Rx Lasix, Jobst stockings, Geri chair. Edema thought to be 2/2 postural and neurogenic causes versus cardiac. Prior increases in diuretics have led to increases in creatinine.   2. Delirium / Dementia / Depression: Rx Seroquel, Aricept, Sertraline.   3. Chronic cystitis: Pt with Hx multiple UTIs. Followed by [facility]. Do not get UA unless systemic symptoms.  4. Chronic Pain: Dx Gout, Back Pain - Continues to c/o pain chronically. On tylenol, allopurinol, colchicine, as needed oxycodone.  Weaned from prednisone with no change in function.  5. L femur frx: Pt with fall 4/409 with L femur frx; s/p ORIF 02/14/08 by Dr Shon Baton.  No longer on coumadin. Repeat xray of leg per ortho showed separation at repair site. Pt no longer ambulatory. Rx ASA 81 mg daily, calcium, vitamin D, bisphosphonate.  6. Hypothyroidism: Rx Synthroid. last TSH 01/10/09 = 1.182  7. HTN: Rx Norvasc, Lasix. BPs stable.  8. Skin: Scalp Wem: I&D (06/01/09), culture showed no primary growth, still slightly tender, drains occasionally. Suprapubic Abscess: spontaneous drainage (07/03/09), +MRSA, Rx Doxy x 10 days, now resolved.  9. Nutrition: Rx: Vitamin C,  Beneprotein, Med Pass supplements.   10. HLD: Rx Lipitor  11. Dyspepsia: Rx Omeprazole  12. CKD, Stage III  13. Skin: Stage 2 Sacral Ulcer  14. NOTES: Patient has [employer].   Current Medications (verified): 1)  Lipitor 40 Mg Tabs (Atorvastatin Calcium) .... Daily 2)  Norvasc 10 Mg  Tabs (Amlodipine Besylate) .... Daily 3)  Synthroid 50 Mcg  Tabs (Levothyroxine Sodium) .... Once Daily 4)  Therapeutic Multivitamin   Tabs (Multiple Vitamin) .... Once Daily 5)  Colchicine 0.6 Mg  Tabs (Colchicine) .Marland Kitchen.. 1 By Mouth Qd 6)  Apap 500 Mg   Caps (Acetaminophen) .Marland Kitchen.. 1000 Mg Three Times A Day 7)  Allopurinol 100 Mg  Tabs (Allopurinol) .... 2 Tablets Daily 8)  Miralax   Powd (Polyethylene Glycol 3350) .Marland Kitchen.. 17g By Mouth Daily 9)  Oxycodone Hcl 5 Mg  Tabs (Oxycodone Hcl) .... Take 1 Tab Q 4hr As Needed Pain 10)  Furosemide 20 Mg  Tabs (Furosemide) .... 2 Tablets Once Daily 11)  Alendronate Sodium 70 Mg  Tabs (Alendronate Sodium) .... One Q Week 12)  Klor-Con M20 20 Meq  Tbcr (Potassium Chloride Crys Cr) .... One Daily 13)  Gabapentin 300 Mg  Caps (Gabapentin) .Marland Kitchen.. 1 By Mouth Tid 14)  Seroquel 100 Mg Tabs (Quetiapine Fumarate) .... 50mg  in The Am  100mg  in The Pm 15)  Zoloft 100 Mg Tabs (Sertraline Hcl) .... 150mg  Daily Take 1 and 1/2 Tablets 16)  Oscal 500/200 D-3 500-200 Mg-Unit  Tabs (Calcium-Vitamin D) .... One Two Times A Day 17)  Aricept 10 Mg Tabs (Donepezil Hcl) .... One By Mouth Q Hs 18)  Loperamide Hcl 2 Mg Tabs (Loperamide Hcl) .... As Needed 19)  Aspirin 81 Mg Tbec (Aspirin) .... Once Daily 20)  Omeprazole 20 Mg Cpdr (Omeprazole) .... Two Daily (40mg ) 21)  Vitamin D (Ergocalciferol) 50000 Unit Caps (Ergocalciferol) .... Once Monthly 22)  Oxycodone Hcl 10 Mg Tabs (Oxycodone Hcl) .... Q4 Hours For Moderate To Severe Pain. 23)  Trazodone Hcl 50 Mg Tabs (Trazodone Hcl) .... At Bedtime 24)  Resource Beneprotein  Pack (Protein) .Marland KitchenMarland KitchenMarland Kitchen 1  Scoop Three Times A Day 25)  Colchicine 0.6 Mg Tabs (Colchicine) .Marland Kitchen.. 1 Tab By Mouth Two Times A Day For Gout.  Allergies (verified): 1)  Hydrochlorothiazide  Past History:  Past medical, surgical, family and social histories (including risk factors) reviewed for relevance to current acute and chronic problems.  Past Medical History: Reviewed history from 08/18/2009 and no changes required. G4P4 Diverticulosis and ext. hemorrhoids on colonoscopy 7/05 Hypothyroidism Hypertension Polymyalgia Rheumatica Restless Leg Syndrome Gout Depression H/O Anemia Peripheral  Edema Hyperlipidemia Constipation Urge Incontinence with recurrent UTI ?? CKD Herpes Zoster  Past Surgical History: Reviewed history from 03/18/2008 and no changes required. Umbilical herniorrhaphy TAH with BSO for fibroids age 37's Cervical 3-4 diskectomy with fusion for myelopathy 2006 Lumbar 1-2 stenosis decompressed 05/04/07 ORIF of Left femur on 02/14/08 by Dr Shon Baton  Family History: Reviewed history from 05/26/2007 and no changes required. Mother died of CHF age 46 Family History of CAD Female 1st degree relative <50 Father died Family History Kidney disease brother died Alzheimers - sister died 12 siblings, 2 living  Social History: Reviewed history from 05/19/2009 and no changes required. Retired from [employer]. Widowed, husband died 01/29/2007. One son was killed in Geneticist, molecular in [place] during [employer]. Has a very supportive son that is a Optician, dispensing that visits her almost daily. Never Smoked. No ETOH use.  Review of Systems General:  Denies chills and fever. CV:  Complains of shortness of breath with exertion and swelling of feet; denies chest pain or discomfort. Resp:  Denies cough, shortness of breath, and wheezing. GI:  Denies abdominal pain. GU:  Denies dysuria.  Physical Exam  General:  NAD, lying in bed.  Lungs:  normal respiratory effort and normal breath sounds.   Heart:  normal rate, regular rhythm, and no murmur.   Abdomen:  soft, no rebound or guarding. +BS. Pulses:  decreased but palpable DP, feet warm. Extremities:  1+ pitting edema to upper shin, feet warm, venous stasis changes, no open lesions. Neurologic:  Sensation intact to light touch.     Impression & Recommendations:  Problem # 1:  LEG EDEMA, BILATERAL (ICD-782.3) Assessment Unchanged Continue Lasix, Jobst stockings, Geri chair.  Problem # 2:  DEMENTIA, SENILE (ICD-290.0) Assessment: Unchanged  Problem # 3:  DEPRESSIVE TYPE PSYCHOSIS (ICD-298.0) Assessment: Unchanged  Problem #  4:  BACK PAIN, CHRONIC (ICD-724.5) Assessment: Unchanged  Her updated medication list for this problem includes:    Apap 500 Mg Caps (Acetaminophen) .Marland KitchenMarland KitchenMarland KitchenMarland Kitchen 1000 mg three times a day    Oxycodone Hcl 5 Mg Tabs (Oxycodone hcl) .Marland Kitchen... Take 2 tab two times a day    Aspirin 81 Mg Tbec (Aspirin) ..... Once daily  Problem # 5:  HYPOTHYROIDISM (ICD-244.9) Assessment: Unchanged  Her updated medication list for this problem includes:    Synthroid 50 Mcg Tabs (Levothyroxine sodium) ..... Once daily  Problem # 6:  GOUT (ICD-274.9) Assessment: Unchanged  Her updated medication list for this problem includes:    Uloric 40 Mg Tabs (Febuxostat) ..... Once daily    Colcrys 0.6 Mg Tabs (Colchicine) ..... Once daily  Complete Medication List: 1)  Lipitor 40 Mg Tabs (Atorvastatin calcium) .... Daily 2)  Norvasc 10 Mg Tabs (Amlodipine besylate) .... Daily 3)  Synthroid 50 Mcg Tabs (Levothyroxine sodium) .... Once daily 4)  Therapeutic Multivitamin Tabs (Multiple vitamin) .... Once daily 5)  Apap 500 Mg Caps (Acetaminophen) .Marland Kitchen.. 1000 mg three times a day 6)  Miralax Powd (Polyethylene glycol 3350) .Marland Kitchen.. 17g by  mouth daily 7)  Oxycodone Hcl 5 Mg Tabs (Oxycodone hcl) .... Take 2 tab two times a day 8)  Alendronate Sodium 70 Mg Tabs (Alendronate sodium) .... One q week 9)  Klor-con M20 20 Meq Tbcr (Potassium chloride crys cr) .... One daily 10)  Gabapentin 300 Mg Caps (Gabapentin) .Marland Kitchen.. 1 by mouth tid 11)  Zoloft 100 Mg Tabs (Sertraline hcl) .... 150mg  daily take 1 and 1/2 tablets 12)  Oscal 500/200 D-3 500-200 Mg-unit Tabs (Calcium-vitamin d) .... One two times a day 13)  Aricept 10 Mg Tabs (Donepezil hcl) .... One by mouth q hs 14)  Loperamide Hcl 2 Mg Tabs (Loperamide hcl) .... As needed 15)  Aspirin 81 Mg Tbec (Aspirin) .... Once daily 16)  Omeprazole 20 Mg Cpdr (Omeprazole) .... Two daily (40mg ) 17)  Vitamin D (ergocalciferol) 50000 Unit Caps (Ergocalciferol) .... Once monthly 18)  Trazodone Hcl 50 Mg  Tabs (Trazodone hcl) .... At bedtime 19)  Resource Beneprotein Pack (Protein) .... 2 scoop three times a day 20)  Uloric 40 Mg Tabs (Febuxostat) .... Once daily 21)  Seroquel 50 Mg Tabs (Quetiapine fumarate) .... Two times a day 22)  Colcrys 0.6 Mg Tabs (Colchicine) .... Once daily

## 2010-12-10 NOTE — Miscellaneous (Signed)
Summary: Med Update - Rx  Clinical Lists Changes  Medications: Removed medication of COLCHICINE 0.6 MG  TABS (COLCHICINE) 1 by mouth qd

## 2010-12-10 NOTE — Miscellaneous (Signed)
Summary: Med Update - Rx  Clinical Lists Changes  Medications: Removed medication of SEROQUEL 50 MG TABS (QUETIAPINE FUMARATE) two times a day Added new medication of SEROQUEL 25 MG TABS (QUETIAPINE FUMARATE) 3 two times a day - Signed Changed medication from TRAZODONE HCL 50 MG TABS (TRAZODONE HCL) 1/2 tab at bedtime (25mg ) to TRAZODONE HCL 50 MG TABS (TRAZODONE HCL) one at bedtime - Signed Removed medication of VITAMIN D3 1000 UNIT TABS (CHOLECALCIFEROL) once daily Added new medication of VITAMIN D (ERGOCALCIFEROL) 50000 UNIT CAPS (ERGOCALCIFEROL) q month - Signed Added new medication of KLOR-CON 20 MEQ PACK (POTASSIUM CHLORIDE) once daily - Signed Changed medication from OXYCODONE HCL 5 MG  TABS (OXYCODONE HCL) q NOON and q4:00PM and MiDNIGHT if needed to OXYCODONE HCL 5 MG  TABS (OXYCODONE HCL) three times a day as needed pain - Signed Rx of SEROQUEL 25 MG TABS (QUETIAPINE FUMARATE) 3 two times a day;  #1 x 0;  Signed;  Entered by: Christian Mate D;  Authorized by: Madelon Lips Pharm D;  Method used: Historical Rx of TRAZODONE HCL 50 MG TABS (TRAZODONE HCL) one at bedtime;  #1 x 0;  Signed;  Entered by: Christian Mate D;  Authorized by: Madelon Lips Pharm D;  Method used: Historical Rx of VITAMIN D (ERGOCALCIFEROL) 50000 UNIT CAPS (ERGOCALCIFEROL) q month;  #1 x 0;  Signed;  Entered by: Christian Mate D;  Authorized by: Madelon Lips Pharm D;  Method used: Historical Rx of KLOR-CON 20 MEQ PACK (POTASSIUM CHLORIDE) once daily;  #1 x 0;  Signed;  Entered by: Christian Mate D;  Authorized by: Madelon Lips Pharm D;  Method used: Historical Rx of OXYCODONE HCL 5 MG  TABS (OXYCODONE HCL) three times a day as needed pain;  #1 x 0;  Signed;  Entered by: Christian Mate D;  Authorized by: Madelon Lips Pharm D;  Method used: Historical    Prescriptions: OXYCODONE HCL 5 MG  TABS (OXYCODONE HCL) three times a day as needed pain  #1 x 0   Entered and Authorized by:   Christian Mate D  Signed by:   Madelon Lips Pharm D on 10/21/2010   Method used:   Historical   RxID:   [id number] KLOR-CON 20 MEQ PACK (POTASSIUM CHLORIDE) once daily  #1 x 0   Entered and Authorized by:   Christian Mate D   Signed by:   Madelon Lips Pharm D on 10/21/2010   Method used:   Historical   RxID:   [id number] VITAMIN D (ERGOCALCIFEROL) 50000 UNIT CAPS (ERGOCALCIFEROL) q month  #1 x 0   Entered and Authorized by:   Christian Mate D   Signed by:   Madelon Lips Pharm D on 10/21/2010   Method used:   Historical   RxID:   [id number] TRAZODONE HCL 50 MG TABS (TRAZODONE HCL) one at bedtime  #1 x 0   Entered and Authorized by:   Christian Mate D   Signed by:   Madelon Lips Pharm D on 10/21/2010   Method used:   Historical   RxID:   [id number] SEROQUEL 25 MG TABS (QUETIAPINE FUMARATE) 3 two times a day  #1 x 0   Entered and Authorized by:   Christian Mate D   Signed by:   Madelon Lips Pharm D on 10/21/2010   Method used:   Historical   RxID:   [id number]

## 2010-12-10 NOTE — Assessment & Plan Note (Signed)
Summary: Nursing Home Visit   Primary Care Provider:  Helane Rima DO  CC:  NH Visit by PCP - Room 301B.  History of Present Illness: Acute Issues: 1. Skin: c/o itching "all over" but worst at feet and hands, better with skin moisturizer.  2. Pain: c/o pain "all over" but worst at right wrist and generalized back.   3. Weight loss: Nutrition/Weight Loss: weight 197 (down from 233 on 08/13/10).   4. Dysuria: endorses mild burning with urination. endorses "trouble" urinating.  Chronic Issues: 1. Leg edema with pain:  Rx Lasix, Jobst stockings, Geri chair. Edema thought to be 2/2 postural and neurogenic causes versus cardiac. Prior increases in diuretics have led to increases in creatinine.   2. Schizophrenia / Dementia: Rx Seroquel, Aricept, Sertraline.   3. Chronic cystitis: Pt with Hx multiple UTIs. Followed by [facility]. Do not get UA unless systemic symptoms. * Monitoring Tm and s/s today. Will get UA if s/s continue. Most recent UCx = enterococcus, pansensitive.  4. Chronic Pain: Dx Gout, Back Pain, Hx PMR - Continues to c/o pain chronically. On tylenol, allopurinol, colchicine, as needed oxycodone.  Weaned from prednisone with no change in function.  5. L femur frx: Pt with fall 4/409 with L femur frx; s/p ORIF 02/14/08 by Dr Shon Baton.  No longer on coumadin. Repeat xray of leg per ortho showed separation at repair site. Pt no longer ambulatory. Rx ASA 81 mg daily, calcium, vitamin D, bisphosphonate.  6. Hypothyroidism: Rx Synthroid. last TSH 12/20/09 = 1.182  7. HTN: Rx Norvasc, Lasix. BPs stable.  8. Skin: Stage 2 Sacral Ulcer.  9. Nutrition/Weight Loss: Weight 197 (down from 233 on 08/13/10). Rx: Vitamin C,  Beneprotein, Med Pass supplements, ST, dysphagia diet, Megace.  10. HLD: Rx Lipitor. Labs:   11. Dyspepsia: Rx Omeprazole.  12. ? CKD: Cr = 0.97 on 12/20/09, GFR 83.  13. NOTES: Patient has Evercare. DNR/DNI.   -  Date:  02/09/2010    Weight 197  Date:   12/20/2009    NA 141    K 4.2    CREAT 0.97    GLU 80  Date:  12/06/2009    LDL 53    HDL 30    TG 189  Current Medications (verified): 1)  Lipitor 40 Mg Tabs (Atorvastatin Calcium) .... Daily 2)  Norvasc 10 Mg  Tabs (Amlodipine Besylate) .... Daily 3)  Synthroid 50 Mcg  Tabs (Levothyroxine Sodium) .... Once Daily 4)  Therapeutic Multivitamin   Tabs (Multiple Vitamin) .... Once Daily 5)  Apap 500 Mg  Caps (Acetaminophen) .Marland Kitchen.. 1000 Mg Three Times A Day 6)  Miralax   Powd (Polyethylene Glycol 3350) .Marland Kitchen.. 17g By Mouth Daily 7)  Oxycodone Hcl 5 Mg  Tabs (Oxycodone Hcl) .... Take 2 Tab Two Times A Day 8)  Alendronate Sodium 70 Mg  Tabs (Alendronate Sodium) .... One Q Week 9)  Klor-Con M20 20 Meq  Tbcr (Potassium Chloride Crys Cr) .... One Daily 10)  Gabapentin 300 Mg  Caps (Gabapentin) .Marland Kitchen.. 1 By Mouth Tid 11)  Zoloft 100 Mg Tabs (Sertraline Hcl) .... 150mg  Daily Take 1 and 1/2 Tablets 12)  Oscal 500/200 D-3 500-200 Mg-Unit  Tabs (Calcium-Vitamin D) .... One Two Times A Day 13)  Aricept 10 Mg Tabs (Donepezil Hcl) .... One By Mouth Q Hs 14)  Loperamide Hcl 2 Mg Tabs (Loperamide Hcl) .... As Needed 15)  Aspirin 81 Mg Tbec (Aspirin) .... Once Daily 16)  Omeprazole 20 Mg Cpdr (Omeprazole) .Marland KitchenMarland KitchenMarland Kitchen  Two Daily (40mg ) 17)  Vitamin D (Ergocalciferol) 50000 Unit Caps (Ergocalciferol) .... Once Monthly 18)  Trazodone Hcl 50 Mg Tabs (Trazodone Hcl) .... At Bedtime 19)  Resource Beneprotein  Pack (Protein) .... 2 Scoop Three Times A Day 20)  Seroquel 50 Mg Tabs (Quetiapine Fumarate) .... Two Times A Day 21)  Colcrys 0.6 Mg Tabs (Colchicine) .... Once Daily 22)  Allopurinol 300 Mg Tabs (Allopurinol) .... Once Daily  Allergies (verified): 1)  Hydrochlorothiazide  Past History:  Past medical, surgical, family and social histories (including risk factors) reviewed for relevance to current acute and chronic problems.  Past Medical History: G4P4 Diverticulosis and ext. hemorrhoids on colonoscopy  7/05 Hypothyroidism Hypertension Polymyalgia Rheumatica Restless Leg Syndrome Gout Depression H/O Anemia Peripheral Edema Hyperlipidemia Constipation Urge Incontinence with recurrent UTI ? CKD Herpes Zoster Weight Loss  Past Surgical History: Reviewed history from 03/18/2008 and no changes required. Umbilical herniorrhaphy TAH with BSO for fibroids age 36's Cervical 3-4 diskectomy with fusion for myelopathy 2006 Lumbar 1-2 stenosis decompressed 05/04/07 ORIF of Left femur on 02/14/08 by Dr Shon Baton  Family History: Reviewed history from 05/26/2007 and no changes required. Mother died of CHF age 51 Family History of CAD Female 1st degree relative <50 Father died Family History Kidney disease brother died Alzheimers - sister died 12 siblings, 2 living  Social History: Reviewed history from 05/19/2009 and no changes required. Retired from [employer]. Widowed, husband died 02-15-07. One son was killed in Geneticist, molecular in [place] during [employer]. Has a very supportive son that is a Optician, dispensing that visits her almost daily. Never Smoked. No ETOH use.  Review of Systems General:  Complains of weight loss; denies chills and fever. CV:  Denies chest pain or discomfort. Resp:  Denies cough, shortness of breath, and wheezing. GI:  Complains of constipation. GU:  Complains of dysuria. Derm:  Complains of itching; denies rash.  Physical Exam  General:  Drowsy, lying in chair. Eyes:  No corneal or conjunctival inflammation noted. EOMI.  Mouth:  Oral mucosa and oropharynx without lesions or exudates.   Neck:  Supple.   Lungs:  Normal respiratory effort and normal breath sounds.   Heart:  Normal rate, regular rhythm, and no murmur.   Abdomen:  Soft, non-tender, and no distention.  Obese. Msk:  Tender boggy area over distal right wrist. Painful for her to move.  Pulses:  Decreased but palpable DP, feet warm. Extremities:  Trace edema, feet warm, venous stasis changes, no open  lesions. Neurologic:  Sensation intact to light touch.   Skin:  Sacral ulcer with new occlusive adhesive bandage in place, no drainage. Psych:  Sleepy, good eye contact.   Impression & Recommendations:  Problem # 1:  PRURITUS (ICD-698.9) Assessment New Generalized but increased at feet and hands. Improved somewhat with skin moisturizing. Will monitor and Rx Zyrtec if itching continues.   Problem # 2:  CHRONIC PAIN SYNDROME (ICD-338.4) Assessment: Unchanged Multifactorial. Hx of oversedation. Continue current regimen.  Orders: Provider Misc Charge- [facility] (Misc)  Problem # 3:  WEIGHT LOSS [phone]) Assessment: Deteriorated Continued weight loss on current regimen. Will add Megace today.  Problem # 4:  DYSURIA (ICD-788.1) Assessment: New Monitoring temp and sxs. In and out cath ordered for UA, UCx if sxs continue. Orders: Provider Misc Charge- [facility] (Misc)  Problem # 5:  DECUBITUS ULCER, SACRUM (ICD-707.03) Assessment: Unchanged Per Wound Care.  Complete Medication List: 1)  Lipitor 40 Mg Tabs (Atorvastatin calcium) .... Daily 2)  Norvasc 10 Mg  Tabs (Amlodipine besylate) .... Daily 3)  Synthroid 50 Mcg Tabs (Levothyroxine sodium) .... Once daily 4)  Therapeutic Multivitamin Tabs (Multiple vitamin) .... Once daily 5)  Apap 500 Mg Caps (Acetaminophen) .Marland Kitchen.. 1000 mg three times a day 6)  Miralax Powd (Polyethylene glycol 3350) .Marland Kitchen.. 17g by mouth daily 7)  Oxycodone Hcl 5 Mg Tabs (Oxycodone hcl) .... Take 2 tab two times a day 8)  Alendronate Sodium 70 Mg Tabs (Alendronate sodium) .... One q week 9)  Klor-con M20 20 Meq Tbcr (Potassium chloride crys cr) .... One daily 10)  Gabapentin 300 Mg Caps (Gabapentin) .Marland Kitchen.. 1 by mouth tid 11)  Zoloft 100 Mg Tabs (Sertraline hcl) .... 150mg  daily take 1 and 1/2 tablets 12)  Oscal 500/200 D-3 500-200 Mg-unit Tabs (Calcium-vitamin d) .... One two times a day 13)  Aricept 10 Mg Tabs (Donepezil hcl) .... One by mouth q hs 14)  Loperamide Hcl 2  Mg Tabs (Loperamide hcl) .... As needed 15)  Aspirin 81 Mg Tbec (Aspirin) .... Once daily 16)  Omeprazole 20 Mg Cpdr (Omeprazole) .... Two daily (40mg ) 17)  Vitamin D (ergocalciferol) 50000 Unit Caps (Ergocalciferol) .... Once monthly 18)  Trazodone Hcl 50 Mg Tabs (Trazodone hcl) .... At bedtime 19)  Resource Beneprotein Pack (Protein) .... 2 scoop three times a day 20)  Seroquel 50 Mg Tabs (Quetiapine fumarate) .... Two times a day 21)  Colcrys 0.6 Mg Tabs (Colchicine) .... Once daily 22)  Allopurinol 300 Mg Tabs (Allopurinol) .... Once daily

## 2010-12-10 NOTE — Consult Note (Signed)
Summary: [name]   Imported By: De Nurse 11/18/2009 11:57:44  _____________________________________________________________________  External Attachment:    Type:   Image     Comment:   External Document

## 2010-12-10 NOTE — Miscellaneous (Signed)
Summary: Med Update - Rx  Clinical Lists Changes  Medications: Added new medication of FISH OIL  OIL (FISH OIL) 1 two times a day - Signed Added new medication of TUCKS ANTI-ITCH 1 % OINT (HYDROCORTISONE ACETATE) as needed for pain - Signed Added new medication of POLAR FREEZE 3.5-0.8 % GEL (MENTHOL-CAMPHOR) Apply to knees and hip as needed PAIN - Signed Changed medication from HYDROCORTISONE 2.5 % CREA (HYDROCORTISONE) Apply twice daily to HYDROCORTISONE 2.5 % CREA (HYDROCORTISONE) Apply twice daily as needed itching - Signed Rx of FISH OIL  OIL (FISH OIL) 1 two times a day;  #1 x 0;  Signed;  Entered by: Christian Mate D;  Authorized by: Madelon Lips Pharm D;  Method used: Historical Rx of TUCKS ANTI-ITCH 1 % OINT (HYDROCORTISONE ACETATE) as needed for pain;  #1 x 0;  Signed;  Entered by: Christian Mate D;  Authorized by: Madelon Lips Pharm D;  Method used: Historical Rx of POLAR FREEZE 3.5-0.8 % GEL (MENTHOL-CAMPHOR) Apply to knees and hip as needed PAIN;  #1 x 0;  Signed;  Entered by: Christian Mate D;  Authorized by: Madelon Lips Pharm D;  Method used: Historical Rx of HYDROCORTISONE 2.5 % CREA (HYDROCORTISONE) Apply twice daily as needed itching;  #1 x 0;  Signed;  Entered by: Christian Mate D;  Authorized by: Madelon Lips Pharm D;  Method used: Historical    Prescriptions: HYDROCORTISONE 2.5 % CREA (HYDROCORTISONE) Apply twice daily as needed itching  #1 x 0   Entered and Authorized by:   Christian Mate D   Signed by:   Madelon Lips Pharm D on 07/28/2010   Method used:   Historical   RxID:   [id number] POLAR FREEZE 3.5-0.8 % GEL (MENTHOL-CAMPHOR) Apply to knees and hip as needed PAIN  #1 x 0   Entered and Authorized by:   Christian Mate D   Signed by:   Madelon Lips Pharm D on 07/28/2010   Method used:   Historical   RxID:   [id number] TUCKS ANTI-ITCH 1 % OINT (HYDROCORTISONE ACETATE) as needed for pain  #1 x 0   Entered and Authorized by:   Christian Mate D   Signed by:   Madelon Lips Pharm D on 07/28/2010   Method used:   Historical   RxID:   [id number] FISH OIL  OIL (FISH OIL) 1 two times a day  #1 x 0   Entered and Authorized by:   Christian Mate D   Signed by:   Madelon Lips Pharm D on 07/28/2010   Method used:   Historical   RxID:   [id number]

## 2010-12-10 NOTE — Miscellaneous (Signed)
Summary: Med Update - Rx  Clinical Lists Changes  Medications: Removed medication of ALBUTEROL SULFATE (2.5 MG/3ML) 0.083% NEBU (ALBUTEROL SULFATE) three times a day as needed Changed medication from RESOURCE BENEPROTEIN  PACK (PROTEIN) 1 scoop three times a day to RESOURCE BENEPROTEIN  PACK (PROTEIN) 2 scoop three times a day - Signed Removed medication of SEROQUEL 100 MG TABS (QUETIAPINE FUMARATE) 50mg  in the AM  100mg  in the PM Added new medication of SEROQUEL 50 MG TABS (QUETIAPINE FUMARATE) two times a day - Signed Changed medication from TRAZODONE HCL 50 MG TABS (TRAZODONE HCL) 1/2 at bedtime (25mg ) to TRAZODONE HCL 50 MG TABS (TRAZODONE HCL) at bedtime - Signed Changed medication from ZINC SULFATE 220 MG TABS (ZINC SULFATE) qd to ZINC SULFATE 220 MG TABS (ZINC SULFATE) daily Added new medication of COLCRYS 0.6 MG TABS (COLCHICINE) once daily - Signed Rx of RESOURCE BENEPROTEIN  PACK (PROTEIN) 2 scoop three times a day;  #1 x 0;  Signed;  Entered by: Christian Mate D;  Authorized by: Madelon Lips Pharm D;  Method used: Historical Rx of SEROQUEL 50 MG TABS (QUETIAPINE FUMARATE) two times a day;  #1 x 0;  Signed;  Entered by: Christian Mate D;  Authorized by: Madelon Lips Pharm D;  Method used: Historical Rx of TRAZODONE HCL 50 MG TABS (TRAZODONE HCL) at bedtime;  #1 x 0;  Signed;  Entered by: Christian Mate D;  Authorized by: Madelon Lips Pharm D;  Method used: Historical Rx of COLCRYS 0.6 MG TABS (COLCHICINE) once daily;  #1 x 0;  Signed;  Entered by: Christian Mate D;  Authorized by: Madelon Lips Pharm D;  Method used: Historical    Prescriptions: COLCRYS 0.6 MG TABS (COLCHICINE) once daily  #1 x 0   Entered and Authorized by:   Christian Mate D   Signed by:   Madelon Lips Pharm D on 12/25/2009   Method used:   Historical   RxID:   [id number] TRAZODONE HCL 50 MG TABS (TRAZODONE HCL) at bedtime  #1 x 0   Entered and Authorized by:   Christian Mate D   Signed by:    Madelon Lips Pharm D on 12/25/2009   Method used:   Historical   RxID:   [id number] SEROQUEL 50 MG TABS (QUETIAPINE FUMARATE) two times a day  #1 x 0   Entered and Authorized by:   Christian Mate D   Signed by:   Madelon Lips Pharm D on 12/25/2009   Method used:   Historical   RxID:   [id number] RESOURCE BENEPROTEIN  PACK (PROTEIN) 2 scoop three times a day  #1 x 0   Entered and Authorized by:   Christian Mate D   Signed by:   Madelon Lips Pharm D on 12/25/2009   Method used:   Historical   RxID:   [id number]

## 2010-12-10 NOTE — Assessment & Plan Note (Signed)
Summary: Initial Nursing Home Visit with new PCP   Primary Care Provider:  Helane Rima DO   History of Present Illness: Tammy Salinas is a 77 year old resident of [facility]. This is a first NH visit with her new PCP.   Her acute issues include:  1. Herpes Zoster - noted 05/14/09 on left hip. Rx: Acyclovir x 1 week. Improving. She states that they "itch" but that she is "trying not to scratch it."  Her chronic issues include:  1. Leg edema with pain -  On Lasix 40mg  two times a day with minimal response. Geri team feels this is related more to postural and neurogenic edema than cardiac. Pt wearing Jobst stockings. Prior increases in diuretics have led to increases in creatinine. Patient now in geri chair per PT to sit with feet elevated.  2. Delirium / Dementia / Depression - No hallucinations.  Dementia much more pronouned than appears with casual conversation (prior MMSE 15/30). On sertraline and seroquel. She is tolerating both without side effect and denies hallucinations at this point.   3. Chronic cystitis - Pt with multiple UTIs and has multiple likely-colonizing bacteria.  Followed by [facility] for likely chronic colonization.  Currently doing well off pyridium. No symptoms in last few months.   4. Chronic pain - Continues to c/o pain chronically. On biofreeze, oxycontin, allopurinol/colchicine. Per son, pt likes to have attention. Weaned from prednisone recently with no change in function.  5. L femur frx - Pt with fall 4/409 with L femur frx; s/p ORIF 02/14/08 by Dr Shon Baton.  No longer on coumadin. Repeat xray of leg per ortho showed separation at repair site. Ortho recommended ASA 81mg  daily and pt no longer ambulatory.   6. Hypothyroidism - last TSH 01/10/09 = 1.182  7. HTN - Rx Norvasc. BPs stable.   Medications Prior to Update: 1)  Lipitor 40 Mg Tabs (Atorvastatin Calcium) .... Daily 2)  Norvasc 10 Mg  Tabs (Amlodipine Besylate) .... Daily 3)  Synthroid 50 Mcg  Tabs  (Levothyroxine Sodium) .... Once Daily 4)  Therapeutic Multivitamin   Tabs (Multiple Vitamin) .... Once Daily 5)  Sennalax-S 8.6-50 Mg  Tabs (Sennosides-Docusate Sodium) .... 2 Tab Once Two Times A Day Prn 6)  Colchicine 0.6 Mg  Tabs (Colchicine) .Marland Kitchen.. 1 By Mouth Qd 7)  Apap 500 Mg  Caps (Acetaminophen) .Marland Kitchen.. 1000 Mg Three Times A Day 8)  Allopurinol 100 Mg  Tabs (Allopurinol) .... 2 Tablets Daily 9)  Miralax   Powd (Polyethylene Glycol 3350) .Marland Kitchen.. 17g By Mouth Daily 10)  Oxycodone Hcl 5 Mg  Tabs (Oxycodone Hcl) .... Take 1 Tab Q 4hr As Needed Pain 11)  Furosemide 20 Mg  Tabs (Furosemide) .... 2 Tablets Two Times A Day 12)  Alendronate Sodium 70 Mg  Tabs (Alendronate Sodium) .... One Q Week 13)  Klor-Con M20 20 Meq  Tbcr (Potassium Chloride Crys Cr) .... One Daily 14)  Oxycontin 10 Mg  Tb12 (Oxycodone Hcl) .Marland Kitchen.. 1 Tab By Mouth Bid 15)  Gabapentin 300 Mg  Caps (Gabapentin) .Marland Kitchen.. 1 By Mouth Tid 16)  Anacin 81 Mg  Tbec (Aspirin) .... One Daily 17)  Seroquel 100 Mg Tabs (Quetiapine Fumarate) .... 50mg  Two Times A Day 18)  Sertraline Hcl 100 Mg Tabs (Sertraline Hcl) .... 1/2 By Mouth Daily 19)  Cvs Omeprazole 20 Mg Tbec (Omeprazole) .... Two Daily 20)  Oscal 500/200 D-3 500-200 Mg-Unit  Tabs (Calcium-Vitamin D) .... One Two Times A Day 21)  Vitamin D3 1000  Unit Caps (Cholecalciferol) .... One Daily  Current Medications (verified): 1)  Lipitor 40 Mg Tabs (Atorvastatin Calcium) .... Daily 2)  Norvasc 10 Mg  Tabs (Amlodipine Besylate) .... Daily 3)  Synthroid 50 Mcg  Tabs (Levothyroxine Sodium) .... Once Daily 4)  Therapeutic Multivitamin   Tabs (Multiple Vitamin) .... Once Daily 5)  Sennalax-S 8.6-50 Mg  Tabs (Sennosides-Docusate Sodium) .... 2 Tab Once Two Times A Day Prn 6)  Colchicine 0.6 Mg  Tabs (Colchicine) .Marland Kitchen.. 1 By Mouth Qd 7)  Apap 500 Mg  Caps (Acetaminophen) .Marland Kitchen.. 1000 Mg Three Times A Day 8)  Allopurinol 100 Mg  Tabs (Allopurinol) .... 2 Tablets Daily 9)  Miralax   Powd (Polyethylene  Glycol 3350) .Marland Kitchen.. 17g By Mouth Daily 10)  Oxycodone Hcl 5 Mg  Tabs (Oxycodone Hcl) .... Take 1 Tab Q 4hr As Needed Pain 11)  Furosemide 20 Mg  Tabs (Furosemide) .... 2 Tablets Two Times A Day 12)  Alendronate Sodium 70 Mg  Tabs (Alendronate Sodium) .... One Q Week 13)  Klor-Con M20 20 Meq  Tbcr (Potassium Chloride Crys Cr) .... One Daily 14)  Oxycontin 10 Mg  Tb12 (Oxycodone Hcl) .Marland Kitchen.. 1 Tab By Mouth Bid 15)  Gabapentin 300 Mg  Caps (Gabapentin) .Marland Kitchen.. 1 By Mouth Tid 16)  Anacin 81 Mg  Tbec (Aspirin) .... One Daily 17)  Seroquel 100 Mg Tabs (Quetiapine Fumarate) .... 50mg  Two Times A Day 18)  Sertraline Hcl 100 Mg Tabs (Sertraline Hcl) .... 1/2 By Mouth Daily 19)  Cvs Omeprazole 20 Mg Tbec (Omeprazole) .... Two Daily 20)  Oscal 500/200 D-3 500-200 Mg-Unit  Tabs (Calcium-Vitamin D) .... One Two Times A Day 21)  Vitamin D3 1000 Unit Caps (Cholecalciferol) .... One Daily  Allergies (verified): 1)  Hydrochlorothiazide  Past History:  Past Surgical History: Last updated: 03/18/2008 Umbilical herniorrhaphy TAH with BSO for fibroids age 69's Cervical 3-4 diskectomy with fusion for myelopathy 2006 Lumbar 1-2 stenosis decompressed 05/04/07 ORIF of Left femur on 02/14/08 by Dr Shon Baton  Family History: Last updated: 06/07/07 Mother died of CHF age 20 Family History of CAD Female 1st degree relative <50 Father died Family History Kidney disease brother died Alzheimers - sister died 12 siblings, 2 living  Past Medical History: G4P4 Diverticulosis and ext. hemorrhoids on colonoscopy 7/05 Hypothyroidism Hypertension Polymyalgia Rheumatica Restless Leg Syndrome Gout Depression H/O Anemia Peripheral Edema Hyperlipidemia Constipation Urge Incontinence with recurrent UTI ?? CKD  Social History: Retired from [employer]. Widowed, husband died 01-30-2007. One son was killed in Geneticist, molecular in [place] during [employer]. Has a very supportive son that is a Optician, dispensing that visits her almost  daily. Never Smoked. No ETOH use.  Review of Systems       per HPI, otherwise negative  Physical Exam  General:  Vitals: T 98.1 BP 117/74 P 67 RR 20. Weight stable x 180 days. Alert, lying in bed, very poor bed mobility, Max assistance to turn in bed needed. Head:  normocephalic and atraumatic.   Eyes:  No corneal or conjunctival inflammation noted. EOMI.  Lungs:  normal respiratory effort and normal breath sounds.   Heart:  normal rate, regular rhythm, and no murmur.   Abdomen:  +BS, soft, NTND Msk:  No erythema or warmth of knees Extremities:  Jobst stockings in place, bilateral legs tender to palpation, feet warm. Neurologic:  non ambulatory, alert & oriented X3 and cranial nerves II-XII intact.   Skin:  Patchy area of erruption of vesicles with errythematous base on  left hip area. Psych:  pleasant with bright affect, conversant easily and able to carry on normal conversation   Impression & Recommendations:  Problem # 1:  HERPES ZOSTER (ICD-053.9) Assessment Unchanged Finish prescribed Acycolvir. Will recheck in a few weeks. Monitor for pain.  Problem # 2:  LEG EDEMA, BILATERAL (ICD-782.3) Assessment: Unchanged Continue current treatment. Hopefully, leg elevation in [place] chair will be helpful.  Her updated medication list for this problem includes:    Furosemide 20 Mg Tabs (Furosemide) .Marland Kitchen... 2 tablets two times a day  Problem # 3:  DEPRESSIVE TYPE PSYCHOSIS (ICD-298.0) Assessment: Comment Only Stable. No hallucinations or delusions noted. Reviewed and updated medication list.  Problem # 4:  GOUT (ICD-274.9) Assessment: Comment Only  Her updated medication list for this problem includes:    Colchicine 0.6 Mg Tabs (Colchicine) .Marland Kitchen... 1 by mouth qd    Allopurinol 100 Mg Tabs (Allopurinol) .Marland Kitchen... 2 tablets daily  Problem # 5:  OBESITY (ICD-278.00) Very heavy, would benefit from weight loss but burns no calories as she is non-ambulatory and completely immobile.  Nutrition  following. Weight noted to be stable x 180 days. She was DC'd from PT on 7/12.  Problem # 6:  DEMENTIA, SENILE (ICD-290.0) Assessment: Unchanged Continue zoloft and seroquel as hallucinations are under control.  Patient also on Aricept.  Problem # 7:  URINARY TRACT INFECTION, RECURRENT (ICD-599.0) Assessment: Unchanged Stable. Follow for symptoms only.   Problem # 8:  HYPOTHYROIDISM (ICD-244.9) Assessment: Unchanged TSH on 01/10/09 = 1.182. Continue Synthroid.  Her updated medication list for this problem includes:    Synthroid 50 Mcg Tabs (Levothyroxine sodium) ..... Once daily  Problem # 9:  HYPERTENSION, BENIGN ESSENTIAL (ICD-401.1) Assessment: Unchanged Stable. Review of vitals show systolic BP normally one-teens. Her updated medication list for this problem includes:    Norvasc 10 Mg Tabs (Amlodipine besylate) .Marland Kitchen... Daily    Furosemide 20 Mg Tabs (Furosemide) .Marland Kitchen... 2 tablets two times a day  Problem # 10:  HYPERLIPIDEMIA (ICD-272.4) Assessment: Comment Only  Her updated medication list for this problem includes:    Lipitor 40 Mg Tabs (Atorvastatin calcium) .Marland Kitchen... Daily  Problem # 11:  CONSTIPATION (ICD-564.00) Assessment: Improved  Her updated medication list for this problem includes:    Sennalax-s 8.6-50 Mg Tabs (Sennosides-docusate sodium) .Marland Kitchen... 2 tab once two times a day prn    Miralax Powd (Polyethylene glycol 3350) .Marland KitchenMarland KitchenMarland KitchenMarland Kitchen 17g by mouth daily  Complete Medication List: 1)  Lipitor 40 Mg Tabs (Atorvastatin calcium) .... Daily 2)  Norvasc 10 Mg Tabs (Amlodipine besylate) .... Daily 3)  Synthroid 50 Mcg Tabs (Levothyroxine sodium) .... Once daily 4)  Therapeutic Multivitamin Tabs (Multiple vitamin) .... Once daily 5)  Sennalax-s 8.6-50 Mg Tabs (Sennosides-docusate sodium) .... 2 tab once two times a day prn 6)  Colchicine 0.6 Mg Tabs (Colchicine) .Marland Kitchen.. 1 by mouth qd 7)  Apap 500 Mg Caps (Acetaminophen) .Marland Kitchen.. 1000 mg three times a day 8)  Allopurinol 100 Mg Tabs  (Allopurinol) .... 2 tablets daily 9)  Miralax Powd (Polyethylene glycol 3350) .Marland Kitchen.. 17g by mouth daily 10)  Oxycodone Hcl 5 Mg Tabs (Oxycodone hcl) .... Take 1 tab q 4hr as needed pain 11)  Furosemide 20 Mg Tabs (Furosemide) .... 2 tablets two times a day 12)  Alendronate Sodium 70 Mg Tabs (Alendronate sodium) .... One q week 13)  Klor-con M20 20 Meq Tbcr (Potassium chloride crys cr) .... One daily 14)  Oxycontin 10 Mg Tb12 (Oxycodone hcl) .Marland Kitchen.. 1 tab by mouth bid  15)  Gabapentin 300 Mg Caps (Gabapentin) .Marland Kitchen.. 1 by mouth tid 16)  Anacin 81 Mg Tbec (Aspirin) .... One daily 17)  Seroquel 100 Mg Tabs (Quetiapine fumarate) .... 50mg  two times a day 18)  Zoloft 50 Mg Tabs (Sertraline hcl) .... 3 tabs by mouth daily 19)  Cvs Omeprazole 20 Mg Tbec (Omeprazole) .... Two daily 20)  Oscal 500/200 D-3 500-200 Mg-unit Tabs (Calcium-vitamin d) .... One two times a day 21)  Vitamin D3 1000 Unit Caps (Cholecalciferol) .... One daily 22)  Aricept 10 Mg Tabs (Donepezil hcl) .... One by mouth q hs   Geriatric Assessment:  Activities of Daily Living:    Bathing-dependent    Dressing-assisted    Eating-assisted    Toileting-assisted    Transferring-assisted    Continence-dependent  Instrumental Activities of Daily Living:    Transportation-dependent    Meal/Food Preparation-dependent    Shopping Errands-dependent    Housekeeping/Chores-dependent    Money Management/Finances-dependent    Medication Management-dependent    Ability to Use Telephone-independent    Laundry-dependent

## 2010-12-10 NOTE — Miscellaneous (Signed)
Summary: Med Update - Rx  Clinical Lists Changes  Medications: Removed medication of MEGACE ORAL 40 MG/ML SUSP (MEGESTROL ACETATE) 20ml (800mg ) daily Changed medication from TRAZODONE HCL 50 MG TABS (TRAZODONE HCL) at bedtime to TRAZODONE HCL 50 MG TABS (TRAZODONE HCL) 1/2 tab at bedtime (25mg ) - Signed Removed medication of ZOLOFT 100 MG TABS (SERTRALINE HCL) 150mg  daily Take 1 and 1/2 tablets Added new medication of CYMBALTA 60 MG CPEP (DULOXETINE HCL) once daily - Signed Added new medication of HYDROCORTISONE 2.5 % CREA (HYDROCORTISONE) Apply twice daily - Signed Removed medication of RESOURCE BENEPROTEIN  PACK (PROTEIN) 2 scoop three times a day Added new medication of PRO-STAT 64  LIQD (AMINO ACIDS-PROTEIN HYDROLYS) 30ml three times a day - Signed Rx of TRAZODONE HCL 50 MG TABS (TRAZODONE HCL) 1/2 tab at bedtime (25mg );  #1 x 0;  Signed;  Entered by: Christian Mate D;  Authorized by: Madelon Lips Pharm D;  Method used: Historical Rx of CYMBALTA 60 MG CPEP (DULOXETINE HCL) once daily;  #1 x 0;  Signed;  Entered by: Christian Mate D;  Authorized by: Madelon Lips Pharm D;  Method used: Historical Rx of HYDROCORTISONE 2.5 % CREA (HYDROCORTISONE) Apply twice daily;  #1 x 0;  Signed;  Entered by: Christian Mate D;  Authorized by: Madelon Lips Pharm D;  Method used: Historical Rx of PRO-STAT 64  LIQD (AMINO ACIDS-PROTEIN HYDROLYS) 30ml three times a day;  #1 x 0;  Signed;  Entered by: Christian Mate D;  Authorized by: Madelon Lips Pharm D;  Method used: Historical    Prescriptions: PRO-STAT 64  LIQD (AMINO ACIDS-PROTEIN HYDROLYS) 30ml three times a day  #1 x 0   Entered and Authorized by:   Christian Mate D   Signed by:   Madelon Lips Pharm D on 04/15/2010   Method used:   Historical   RxID:   [id number] HYDROCORTISONE 2.5 % CREA (HYDROCORTISONE) Apply twice daily  #1 x 0   Entered and Authorized by:   Christian Mate D   Signed by:   Madelon Lips Pharm D on 04/15/2010  Method used:   Historical   RxID:   [id number] CYMBALTA 60 MG CPEP (DULOXETINE HCL) once daily  #1 x 0   Entered and Authorized by:   Christian Mate D   Signed by:   Madelon Lips Pharm D on 04/15/2010   Method used:   Historical   RxID:   [id number] TRAZODONE HCL 50 MG TABS (TRAZODONE HCL) 1/2 tab at bedtime (25mg )  #1 x 0   Entered and Authorized by:   Christian Mate D   Signed by:   Madelon Lips Pharm D on 04/15/2010   Method used:   Historical   RxID:   [id number]

## 2010-12-10 NOTE — Assessment & Plan Note (Signed)
Summary: PCP Nursing Home Visit - [facility]   Primary Care Provider:  Helane Rima DO  CC:  PCP NH Visit.  History of Present Illness: Vitals (9/17) - T 98.4 BP 100/59 HR 60 RR 20 Weight 209 (09/2) Acute Issue: 1. Cough: "I got the virus." x 2-3 days, not productive, no SOB, CP, wheeze. Denies nasal congestion. Endorses runny nose. No N/V/D, LE edema, rash.  Chronic Issues: 1. Hemorrhoids: Rx Miralax, Tucks, Hydrocortisone.   2. Schizophrenia / Dementia: Rx Seroquel, Aricept, Cymbalta. MMSE 22/30.  3. Chronic cystitis: Pt with Hx multiple UTIs. Followed by [facility]. Do not get UA unless systemic symptoms.  4. Chronic Pain: Dx Gout, Back Pain, Hx PMR - Continues to c/o pain chronically. On Tylenol, Gabapentin, and Oxycontin.  Weaned from prednisone with no change in function. Now in OT 3x/week. NOTE: PATIENT HAS CHRONIC RIGHT HAND PAIN.  5. L femur frx: Pt with fall 4/409 with L femur frx; s/p ORIF 02/14/08 by Dr Shon Baton.  No longer on coumadin. Repeat xray of leg per ortho showed separation at repair site. Pt no longer ambulatory. Rx ASA, Calcium, Vitamin D, Bisphosphonate.  6. Hypothyroidism: Rx Synthroid. last TSH 05/26/10 = 1.257   7. HTN: Rx Norvasc. BPs stable.  8. Nutrition/Weight Loss:  Rx: Vitamin C,  Beneprotein, Med Pass supplements, ST, dysphagia diet.  9. HLD: Rx Lipitor. chol 140, trig 108, HDL 40, LDL 78.  10. Dyspepsia: Rx Omeprazole.  11. CKD: Unchanged. Creatinine 1.09 (07/10/10).  12. NOTES: Patient has [facility]. DNR/DNI.    Current Medications (verified): 1)  Lipitor 40 Mg Tabs (Atorvastatin Calcium) .... Daily 2)  Norvasc 10 Mg  Tabs (Amlodipine Besylate) .... Daily 3)  Synthroid 50 Mcg  Tabs (Levothyroxine Sodium) .... Once Daily 4)  Therapeutic Multivitamin   Tabs (Multiple Vitamin) .... Once Daily 5)  Apap 500 Mg  Caps (Acetaminophen) .Marland Kitchen.. 1000 Mg Three Times A Day 6)  Oxycodone Hcl 5 Mg  Tabs (Oxycodone Hcl) .... Q Noon and Q4:00pm and Midnight If  Needed 7)  Alendronate Sodium 70 Mg  Tabs (Alendronate Sodium) .... One Q Week 8)  Klor-Con M20 20 Meq  Tbcr (Potassium Chloride Crys Cr) .... One Daily 9)  Gabapentin 300 Mg  Caps (Gabapentin) .... 2 By Mouth Tid 10)  Aricept 10 Mg Tabs (Donepezil Hcl) .... One By Mouth Q Hs 11)  Aspirin 81 Mg Tbec (Aspirin) .... Once Daily 12)  Omeprazole 20 Mg Cpdr (Omeprazole) .... Two Daily (40mg ) 13)  Vitamin D (Ergocalciferol) 50000 Unit Caps (Ergocalciferol) .... Once Monthly 14)  Trazodone Hcl 50 Mg Tabs (Trazodone Hcl) .... 1/2 Tab At Bedtime (25mg ) 15)  Seroquel 50 Mg Tabs (Quetiapine Fumarate) .... Two Times A Day 16)  Colcrys 0.6 Mg Tabs (Colchicine) .... Once Daily 17)  Allopurinol 300 Mg Tabs (Allopurinol) .... Once Daily 18)  Oxycontin 10 Mg Xr12h-Tab (Oxycodone Hcl) .Marland Kitchen.. 1 Two Times A Day 19)  Calcarb 600 1500 Mg Tabs (Calcium Carbonate) .... Two Times A Day 20)  Vitamin C Cr 500 Mg Cr-Tabs (Ascorbic Acid) .... Two Times A Day 21)  Cymbalta 60 Mg Cpep (Duloxetine Hcl) .... Once Daily 22)  Hydrocortisone 2.5 % Crea (Hydrocortisone) .... Apply Twice Daily As Needed Itching 23)  Pro-Stat 64  Liqd (Amino Acids-Protein Hydrolys) .... 30ml Three Times A Day 24)  Fish Oil  Oil (Fish Oil) .Marland Kitchen.. 1 Two Times A Day 25)  Tucks Anti-Itch 1 % Oint (Hydrocortisone Acetate) .... As Needed For Pain 26)  Polar Freeze  3.5-0.8 % Gel (Menthol-Camphor) .... Apply To Knees and Hip As Needed Pain  Allergies (verified): 1)  Hydrochlorothiazide  Past History:  Past medical, surgical, family and social histories (including risk factors) reviewed for relevance to current acute and chronic problems.  Past Medical History: G4P4 Diverticulosis and External Hemorrhoids on Colonoscopy 7/05 Hypothyroidism Hypertension Polymyalgia Rheumatica Restless Leg Syndrome Gout Depression H/O Anemia Peripheral Edema Hyperlipidemia Constipation Urge Incontinence with recurrent UTI ? CKD Herpes Zoster Weight  Loss Dental Exam - 06/12/10 Eye Exam - 04/27/10 - Open Angle Glaucoma  Past Surgical History: Reviewed history from 03/18/2008 and no changes required. Umbilical herniorrhaphy TAH with BSO for fibroids age 65's Cervical 3-4 diskectomy with fusion for myelopathy 2006 Lumbar 1-2 stenosis decompressed 05/04/07 ORIF of Left femur on 02/14/08 by Dr Shon Baton  Family History: Reviewed history from 05/26/2007 and no changes required. Mother died of CHF age 51 Family History of CAD Female 1st degree relative <50 Father died Family History Kidney disease brother died Alzheimers - sister died 12 siblings, 2 living  Social History: Reviewed history from 05/19/2009 and no changes required. Retired from [employer]. Widowed, husband died 01/30/2007. One son was killed in Geneticist, molecular in [place] during [employer]. Has a very supportive son that is a Optician, dispensing that visits her almost daily. Never Smoked. No ETOH use.  Contact Numbers for son, Marinell Blight: Cell: [phone] Work: [phone] Home: [phone]  Review of Systems General:  Denies chills and fever. ENT:  Denies difficulty swallowing, earache, nasal congestion, and sore throat. CV:  Denies chest pain or discomfort. Resp:  Complains of cough; denies chest pain with inspiration, coughing up blood, shortness of breath, and wheezing. GI:  Denies change in bowel habits.  Physical Exam  General:  Awake, lying in bed. Foam Geomats under both legs. Vitals reviewed. Eyes:  No injection. Nose:  Erythematous turbinates. Clear discharge. Mouth:  MMM. Dentures. Neck:  Supple.   Lungs:  Normal respiratory effort and normal breath sounds.   Heart:  Normal rate, regular rhythm, and no murmur.   Abdomen:  Soft, non-tender, and no distention.  Obese. Pulses:  Decreased but palpable DP, feet warm. Extremities:  Trace edema, feet warm, venous stasis changes, no open lesions. Psych:  Sleepy, good eye contact. Flat affect and subdued.      Impression & Recommendations:  Problem # 1:  VIRAL URI (ICD-465.9) Assessment New No red flags. Sympomatic treatment. Her updated medication list for this problem includes:    Apap 500 Mg Caps (Acetaminophen) .Marland KitchenMarland KitchenMarland KitchenMarland Kitchen 1000 mg three times a day    Aspirin 81 Mg Tbec (Aspirin) ..... Once daily  Problem # 2:  CHRONIC PAIN SYNDROME (ICD-338.4) Assessment: Unchanged OT 3x/week. Rx Tylenol. NOTE: right hand pain is chronic.  Problem # 3:  HYPERTENSION, BENIGN ESSENTIAL (ICD-401.1) Assessment: Improved  Her updated medication list for this problem includes:    Norvasc 10 Mg Tabs (Amlodipine besylate) .Marland Kitchen... Daily  Problem # 4:  HYPERLIPIDEMIA (ICD-272.4) Assessment: Improved  Her updated medication list for this problem includes:    Lipitor 40 Mg Tabs (Atorvastatin calcium) .Marland Kitchen... Daily  Problem # 5:  LEG EDEMA, BILATERAL (ICD-782.3) Assessment: Improved  Problem # 6:  HYPOTHYROIDISM (ICD-244.9) Assessment: Unchanged  Her updated medication list for this problem includes:    Synthroid 50 Mcg Tabs (Levothyroxine sodium) ..... Once daily  Complete Medication List: 1)  Lipitor 40 Mg Tabs (Atorvastatin calcium) .... Daily 2)  Norvasc 10 Mg Tabs (Amlodipine besylate) .... Daily 3)  Synthroid 50 Mcg  Tabs (Levothyroxine sodium) .... Once daily 4)  Therapeutic Multivitamin Tabs (Multiple vitamin) .... Once daily 5)  Apap 500 Mg Caps (Acetaminophen) .Marland Kitchen.. 1000 mg three times a day 6)  Oxycodone Hcl 5 Mg Tabs (Oxycodone hcl) .... Q noon and q4:00pm and midnight if needed 7)  Alendronate Sodium 70 Mg Tabs (Alendronate sodium) .... One q week 8)  Klor-con M20 20 Meq Tbcr (Potassium chloride crys cr) .... One daily 9)  Gabapentin 300 Mg Caps (Gabapentin) .... 2 by mouth tid 10)  Aricept 10 Mg Tabs (Donepezil hcl) .... One by mouth q hs 11)  Aspirin 81 Mg Tbec (Aspirin) .... Once daily 12)  Omeprazole 20 Mg Cpdr (Omeprazole) .... Two daily (40mg ) 13)  Vitamin D (ergocalciferol) 50000 Unit  Caps (Ergocalciferol) .... Once monthly 14)  Trazodone Hcl 50 Mg Tabs (Trazodone hcl) .... 1/2 tab at bedtime (25mg ) 15)  Seroquel 50 Mg Tabs (Quetiapine fumarate) .... Two times a day 16)  Colcrys 0.6 Mg Tabs (Colchicine) .... Once daily 17)  Allopurinol 300 Mg Tabs (Allopurinol) .... Once daily 18)  Oxycontin 10 Mg Xr12h-tab (Oxycodone hcl) .Marland Kitchen.. 1 two times a day 19)  Calcarb 600 1500 Mg Tabs (Calcium carbonate) .... Two times a day 20)  Vitamin C Cr 500 Mg Cr-tabs (Ascorbic acid) .... Two times a day 21)  Cymbalta 60 Mg Cpep (Duloxetine hcl) .... Once daily 22)  Hydrocortisone 2.5 % Crea (Hydrocortisone) .... Apply twice daily as needed itching 23)  Pro-stat 64 Liqd (Amino acids-protein hydrolys) .... 30ml three times a day 24)  Fish Oil Oil (Fish oil) .Marland Kitchen.. 1 two times a day 25)  Tucks Anti-itch 1 % Oint (Hydrocortisone acetate) .... As needed for pain 26)  Polar Freeze 3.5-0.8 % Gel (Menthol-camphor) .... Apply to knees and hip as needed pain  Prevention & Chronic Care Immunizations   Influenza vaccine: 09/04/09  (08/02/2010)   Influenza vaccine deferral: Not available  (05/05/2010)   Influenza vaccine due: 08/12/2009    Tetanus booster: Not documented   Td booster deferral: Not indicated  (06/10/2010)    Pneumococcal vaccine: Not documented   Pneumococcal vaccine deferral: Not indicated  (08/02/2010)    H. zoster vaccine: Not documented  Colorectal Screening   Hemoccult: Not documented   Hemoccult due: Not Indicated    Colonoscopy: 04/14/04  (08/02/2010)   Colonoscopy due: Not Indicated  Other Screening   Pap smear: Not documented   Pap smear due: Not Indicated    Mammogram: Not documented   Mammogram due: Not Indicated    DXA bone density scan: Not documented   DXA scan due: Not Indicated    Smoking status: quit  (01/29/2009)  Lipids   Total Cholesterol: 140  (06/29/2010)   Lipid panel action/deferral: Not indicated   LDL: 78  (06/29/2010)   LDL Direct: Not  documented   HDL: 40  (06/29/2010)   Triglycerides: 108  (06/29/2010)    SGOT (AST): Not documented   SGPT (ALT): Not documented   Alkaline phosphatase: Not documented   Total bilirubin: Not documented    Lipid flowsheet reviewed?: Yes   Progress toward LDL goal: At goal  Hypertension   Last Blood Pressure: 140 / 85  (05/16/2010)   Serum creatinine: 1.09  (07/10/2010)   Serum potassium 4.5  (07/10/2010)    Hypertension flowsheet reviewed?: Yes   Progress toward BP goal: At goal  Self-Management Support :   Personal Goals (by the next clinic visit) :      Personal blood pressure  goal: 140/90  (05/05/2010)     Personal LDL goal: 70  (05/05/2010)    Patient will work on the following items until the next clinic visit to reach self-care goals:     Medications and monitoring: take my medicines every day  (08/02/2010)    Hypertension self-management support: Written self-care plan  (08/02/2010)   Hypertension self-care plan printed.    Lipid self-management support: Written self-care plan  (08/02/2010)   Lipid self-care plan printed.    Geriatric Assessment:  Activities of Daily Living:    Bathing-dependent    Dressing-dependent    Eating-assisted    Toileting-dependent    Transferring-dependent Overall Assessment: dependent  Instrumental Activities of Daily Living:    Transportation-dependent    Meal/Food Preparation-dependent    Shopping Errands-dependent    Housekeeping/Chores-dependent    Money Management/Finances-dependent    Medication Management-dependent    Ability to Use Telephone-dependent    Laundry-dependent

## 2010-12-10 NOTE — Miscellaneous (Signed)
Summary: Med Update - Rx  Clinical Lists Changes  Medications: Removed medication of FISH OIL  OIL (FISH OIL) 1 two times a day Removed medication of VITAMIN C CR 500 MG CR-TABS (ASCORBIC ACID) two times a day Removed medication of KLOR-CON M20 20 MEQ  TBCR (POTASSIUM CHLORIDE CRYS CR) one daily Removed medication of GUAIFENESIN 100 MG/5ML SYRP (GUAIFENESIN) as needed congestion

## 2010-12-10 NOTE — Miscellaneous (Signed)
Summary: Med Update - Rx  Clinical Lists Changes  Medications: Removed medication of CYMBALTA 60 MG CPEP (DULOXETINE HCL) once daily Added new medication of CYMBALTA 30 MG CPEP (DULOXETINE HCL) once daily - Signed Added new medication of FOSAMAX 70 MG TABS (ALENDRONATE SODIUM) once weekly on Friday AM - 30 mnutes prior to other medicaitons and meals. - Signed Changed medication from TRAZODONE HCL 50 MG TABS (TRAZODONE HCL) one at bedtime to TRAZODONE HCL 50 MG TABS (TRAZODONE HCL) Take 1/2 tablet (25mg ) qhs AND one at bedtime as needed if 25 does NOT work - Curator of CYMBALTA 30 MG CPEP (DULOXETINE HCL) once daily;  #1 x 0;  Signed;  Entered by: Christian Mate D;  Authorized by: Madelon Lips Pharm D;  Method used: Historical Rx of FOSAMAX 70 MG TABS (ALENDRONATE SODIUM) once weekly on Friday AM - 30 mnutes prior to other medicaitons and meals.;  #1 x 0;  Signed;  Entered by: Christian Mate D;  Authorized by: Madelon Lips Pharm D;  Method used: Historical Rx of TRAZODONE HCL 50 MG TABS (TRAZODONE HCL) Take 1/2 tablet (25mg ) qhs AND one at bedtime as needed if 25 does NOT work;  #1 x 0;  Signed;  Entered by: Christian Mate D;  Authorized by: Madelon Lips Pharm D;  Method used: Historical    Prescriptions: TRAZODONE HCL 50 MG TABS (TRAZODONE HCL) Take 1/2 tablet (25mg ) qhs AND one at bedtime as needed if 25 does NOT work  #1 x 0   Entered and Authorized by:   Christian Mate D   Signed by:   Madelon Lips Pharm D on 11/27/2010   Method used:   Historical   RxID:   [id number] FOSAMAX 70 MG TABS (ALENDRONATE SODIUM) once weekly on Friday AM - 30 mnutes prior to other medicaitons and meals.  #1 x 0   Entered and Authorized by:   Christian Mate D   Signed by:   Madelon Lips Pharm D on 11/27/2010   Method used:   Historical   RxID:   [id number] CYMBALTA 30 MG CPEP (DULOXETINE HCL) once daily  #1 x 0   Entered and Authorized by:   Christian Mate D   Signed by:   Madelon Lips Pharm D on 11/27/2010   Method used:   Historical   RxID:   [id number]

## 2010-12-10 NOTE — Assessment & Plan Note (Signed)
Summary: PCP Nursing Home Visit   Primary Care Provider:  Helane Rima DO  CC:  PCP Nursing Home Visit.  History of Present Illness: Tammy Salinas is a 77 year old resident of [facility].   1. Leg edema with pain -  Rx Lasix 40mg  daily, Jobst stockings, Geri chair. Edema thought to be 2/2 postural and neurogenic causes versus cardiac. Prior increases in diuretics have led to increases in creatinine.   2. Delirium / Dementia / Depression - Rx Seroquel, Aricept, Sertraline. Patient had pm hallucinations with recent attempt to decrease antipsychotic.   3. Chronic cystitis - Pt with Hx multiple UTIs. Followed by [facility]. No symptoms in last few months.   4. Chronic Pain - Dx Gout, Back Pain - Continues to c/o pain chronically. On biofreeze, oxycontin, allopurinol/colchicine, as needed oxycodone.  Weaned from prednisone with no change in function.  5. L femur frx - Pt with fall 4/409 with L femur frx; s/p ORIF 02/14/08 by Dr Shon Baton.  No longer on coumadin. Repeat xray of leg per ortho showed separation at repair site. Pt no longer ambulatory. Rx ASA 81 mg daily, calcium, vitamin D, bisphosphonate.  6. Hypothyroidism - last TSH 01/10/09 = 1.182  7. HTN - Rx Norvasc, Lasix. BPs stable.  8. Skin: Scalp Wem: I&D (06/01/09), culture showed no primary growth, still slightly tender, drains occasionally. Suprapubic Abscess: spontaneous drainage (07/03/09), +MRSA, Rx Doxy x 10 days, now resolved.  9. Nutrition: Rx: Vitamin C,  Beneprotein, Med Pass supplements.   10. Constipation: Rx Miralax as needed. Hx diverticulosis.   11. HLD: Rx Lipitor  12. Dyspepsia: Rx Omeprazole  13. Herpes Zoster: Resolved  Current Medications (verified): 1)  Lipitor 40 Mg Tabs (Atorvastatin Calcium) .... Daily 2)  Norvasc 10 Mg  Tabs (Amlodipine Besylate) .... Daily 3)  Synthroid 50 Mcg  Tabs (Levothyroxine Sodium) .... Once Daily 4)  Therapeutic Multivitamin   Tabs (Multiple Vitamin) .... Once Daily 5)   Colchicine 0.6 Mg  Tabs (Colchicine) .Marland Kitchen.. 1 By Mouth Qd 6)  Apap 500 Mg  Caps (Acetaminophen) .Marland Kitchen.. 1000 Mg Three Times A Day 7)  Allopurinol 100 Mg  Tabs (Allopurinol) .... 2 Tablets Daily 8)  Miralax   Powd (Polyethylene Glycol 3350) .Marland Kitchen.. 17g By Mouth Daily 9)  Oxycodone Hcl 5 Mg  Tabs (Oxycodone Hcl) .... Take 1 Tab Q 4hr As Needed Pain 10)  Furosemide 20 Mg  Tabs (Furosemide) .... 2 Tablets Once Daily 11)  Alendronate Sodium 70 Mg  Tabs (Alendronate Sodium) .... One Q Week 12)  Klor-Con M20 20 Meq  Tbcr (Potassium Chloride Crys Cr) .... One Daily 13)  Gabapentin 300 Mg  Caps (Gabapentin) .Marland Kitchen.. 1 By Mouth Tid 14)  Seroquel 100 Mg Tabs (Quetiapine Fumarate) .... 50mg  in The Am  100mg  in The Pm 15)  Zoloft 100 Mg Tabs (Sertraline Hcl) .... 150mg  Daily Take 1 and 1/2 Tablets 16)  Oscal 500/200 D-3 500-200 Mg-Unit  Tabs (Calcium-Vitamin D) .... One Two Times A Day 17)  Aricept 10 Mg Tabs (Donepezil Hcl) .... One By Mouth Q Hs 18)  Loperamide Hcl 2 Mg Tabs (Loperamide Hcl) .... As Needed 19)  Aspirin 81 Mg Tbec (Aspirin) .... Once Daily 20)  Omeprazole 20 Mg Cpdr (Omeprazole) .... Two Daily (40mg ) 21)  Vitamin D (Ergocalciferol) 50000 Unit Caps (Ergocalciferol) .... Once Monthly 22)  Oxycodone Hcl 10 Mg Tabs (Oxycodone Hcl) .... Q4 Hours For Moderate To Severe Pain. 23)  Trazodone Hcl 50 Mg Tabs (Trazodone Hcl) .... At  Bedtime 24)  Vitamin C 500 Mg Tabs (Ascorbic Acid) .Marland Kitchen.. 1 Tablet Two Times A Day 25)  Resource Beneprotein  Pack (Protein) .Marland Kitchen.. 1 Scoop Three Times A Day  Allergies (verified): 1)  Hydrochlorothiazide  Past History:  Past medical, surgical, family and social histories (including risk factors) reviewed for relevance to current acute and chronic problems.  Past Medical History: Reviewed history from 08/18/2009 and no changes required. G4P4 Diverticulosis and ext. hemorrhoids on colonoscopy 7/05 Hypothyroidism Hypertension Polymyalgia Rheumatica Restless Leg  Syndrome Gout Depression H/O Anemia Peripheral Edema Hyperlipidemia Constipation Urge Incontinence with recurrent UTI ?? CKD Herpes Zoster  Past Surgical History: Reviewed history from 03/18/2008 and no changes required. Umbilical herniorrhaphy TAH with BSO for fibroids age 26's Cervical 3-4 diskectomy with fusion for myelopathy 2006 Lumbar 1-2 stenosis decompressed 05/04/07 ORIF of Left femur on 02/14/08 by Dr Shon Baton  Family History: Reviewed history from 05/26/2007 and no changes required. Mother died of CHF age 59 Family History of CAD Female 1st degree relative <50 Father died Family History Kidney disease brother died Alzheimers - sister died 12 siblings, 2 living  Social History: Reviewed history from 05/19/2009 and no changes required. Retired from [employer]. Widowed, husband died 02-16-2007. One son was killed in Geneticist, molecular in [place] during [employer]. Has a very supportive son that is a Optician, dispensing that visits her almost daily. Never Smoked. No ETOH use.  Physical Exam  General:  Alert, NAD. Lungs:  normal respiratory effort and normal breath sounds.   Heart:  normal rate, regular rhythm, and no murmur.   Abdomen:  +BS, soft, NTND Extremities:  1+ pitting edema to upper shin, feet warm, venous stasis changes, no open lesions. Neurologic:  Sensation intact to light touch.   Psych:  Normally interactive, good eye contact, not anxious appearing, and not depressed appearing.     Impression & Recommendations:  Problem # 1:  LEG EDEMA, BILATERAL (ICD-782.3) Assessment Unchanged Continue Lasix, Jobst stockings, Geri chair.  Her updated medication list for this problem includes:    Furosemide 20 Mg Tabs (Furosemide) .Marland Kitchen... 2 tablets once daily  Problem # 2:  BACK PAIN, CHRONIC (ICD-724.5) Assessment: Unchanged  Her updated medication list for this problem includes:    Apap 500 Mg Caps (Acetaminophen) .Marland KitchenMarland KitchenMarland KitchenMarland Kitchen 1000 mg three times a day    Oxycodone Hcl 5 Mg Tabs  (Oxycodone hcl) .Marland Kitchen... Take 1 tab q 4hr as needed pain    Aspirin 81 Mg Tbec (Aspirin) ..... Once daily    Oxycodone Hcl 10 Mg Tabs (Oxycodone hcl) ..... Q4 hours for moderate to severe pain.  Problem # 3:  GOUT (ICD-274.9) Assessment: Unchanged  Her updated medication list for this problem includes:    Colchicine 0.6 Mg Tabs (Colchicine) .Marland Kitchen... 1 by mouth qd    Allopurinol 100 Mg Tabs (Allopurinol) .Marland Kitchen... 2 tablets daily    Colchicine 0.6 Mg Tabs (Colchicine) .Marland Kitchen... 1 tab by mouth two times a day for gout.  Problem # 4:  DEMENTIA, SENILE (ICD-290.0) Assessment: Unchanged  Problem # 5:  HYPOTHYROIDISM (ICD-244.9) Assessment: Unchanged  Her updated medication list for this problem includes:    Synthroid 50 Mcg Tabs (Levothyroxine sodium) ..... Once daily  Problem # 6:  HYPERTENSION, BENIGN ESSENTIAL (ICD-401.1) Assessment: Unchanged  Her updated medication list for this problem includes:    Norvasc 10 Mg Tabs (Amlodipine besylate) .Marland Kitchen... Daily    Furosemide 20 Mg Tabs (Furosemide) .Marland Kitchen... 2 tablets once daily  Problem # 7:  CONSTIPATION (ICD-564.00) Assessment: Unchanged  Her updated medication list for this problem includes:    Miralax Powd (Polyethylene glycol 3350) .Marland KitchenMarland KitchenMarland KitchenMarland Kitchen 17g by mouth daily  Complete Medication List: 1)  Lipitor 40 Mg Tabs (Atorvastatin calcium) .... Daily 2)  Norvasc 10 Mg Tabs (Amlodipine besylate) .... Daily 3)  Synthroid 50 Mcg Tabs (Levothyroxine sodium) .... Once daily 4)  Therapeutic Multivitamin Tabs (Multiple vitamin) .... Once daily 5)  Colchicine 0.6 Mg Tabs (Colchicine) .Marland Kitchen.. 1 by mouth qd 6)  Apap 500 Mg Caps (Acetaminophen) .Marland Kitchen.. 1000 mg three times a day 7)  Allopurinol 100 Mg Tabs (Allopurinol) .... 2 tablets daily 8)  Miralax Powd (Polyethylene glycol 3350) .Marland Kitchen.. 17g by mouth daily 9)  Oxycodone Hcl 5 Mg Tabs (Oxycodone hcl) .... Take 1 tab q 4hr as needed pain 10)  Furosemide 20 Mg Tabs (Furosemide) .... 2 tablets once daily 11)  Alendronate  Sodium 70 Mg Tabs (Alendronate sodium) .... One q week 12)  Klor-con M20 20 Meq Tbcr (Potassium chloride crys cr) .... One daily 13)  Gabapentin 300 Mg Caps (Gabapentin) .Marland Kitchen.. 1 by mouth tid 14)  Seroquel 100 Mg Tabs (Quetiapine fumarate) .... 50mg  in the am  100mg  in the pm 15)  Zoloft 100 Mg Tabs (Sertraline hcl) .... 150mg  daily take 1 and 1/2 tablets 16)  Oscal 500/200 D-3 500-200 Mg-unit Tabs (Calcium-vitamin d) .... One two times a day 17)  Aricept 10 Mg Tabs (Donepezil hcl) .... One by mouth q hs 18)  Loperamide Hcl 2 Mg Tabs (Loperamide hcl) .... As needed 19)  Aspirin 81 Mg Tbec (Aspirin) .... Once daily 20)  Omeprazole 20 Mg Cpdr (Omeprazole) .... Two daily (40mg ) 21)  Vitamin D (ergocalciferol) 50000 Unit Caps (Ergocalciferol) .... Once monthly 22)  Oxycodone Hcl 10 Mg Tabs (Oxycodone hcl) .... Q4 hours for moderate to severe pain. 23)  Trazodone Hcl 50 Mg Tabs (Trazodone hcl) .... At bedtime 24)  Resource Beneprotein Pack (Protein) .Marland Kitchen.. 1 scoop three times a day 25)  Colchicine 0.6 Mg Tabs (Colchicine) .Marland Kitchen.. 1 tab by mouth two times a day for gout.

## 2010-12-10 NOTE — Miscellaneous (Signed)
Summary: Med Update - Rx  Clinical Lists Changes  Medications: Added new medication of MIRALAX  POWD (POLYETHYLENE GLYCOL 3350) 17 grams in 8 ounces of water daily - Signed Added new medication of GUAIFENESIN 100 MG/5ML SYRP (GUAIFENESIN) as needed congestion - Signed Rx of MIRALAX  POWD (POLYETHYLENE GLYCOL 3350) 17 grams in 8 ounces of water daily;  #1 x 0;  Signed;  Entered by: Christian Mate D;  Authorized by: Madelon Lips Pharm D;  Method used: Historical Rx of GUAIFENESIN 100 MG/5ML SYRP (GUAIFENESIN) as needed congestion;  #1 x 0;  Signed;  Entered by: Christian Mate D;  Authorized by: Madelon Lips Pharm D;  Method used: Historical    Prescriptions: GUAIFENESIN 100 MG/5ML SYRP (GUAIFENESIN) as needed congestion  #1 x 0   Entered and Authorized by:   Christian Mate D   Signed by:   Madelon Lips Pharm D on 09/21/2010   Method used:   Historical   RxID:   [id number] MIRALAX  POWD (POLYETHYLENE GLYCOL 3350) 17 grams in 8 ounces of water daily  #1 x 0   Entered and Authorized by:   Christian Mate D   Signed by:   Madelon Lips Pharm D on 09/21/2010   Method used:   Historical   RxID:   [id number]

## 2010-12-10 NOTE — Assessment & Plan Note (Signed)
Summary: NH visit   Primary Care Provider:  Helane Rima DO   History of Present Illness: Tammy Salinas complains of  right wrist pain and also pains in her legs when she gets up and walks. (She had been non-ambulatory for some months.   Allergies: 1)  Hydrochlorothiazide  Physical Exam  Lungs:  Normal respiratory effort and normal breath sounds.   Heart:  Normal rate, regular rhythm, and no murmur.   Abdomen:  Soft, non-tender, and no distention.  Obese.no guarding, no hepatomegaly, and no splenomegaly.   Msk:  Tender but not swollen area over distal right wrist. Less painful for her to move, but unable to dorsiflex past neutral. Complains of  pain with passive movement of legs, but can't localize them.  Extremities:  Trace edema, feet warm, venous stasis changes, no open lesions. Neurologic:  Drowsy, disoriented, but pleasant and generally appropriate responses. Clonus both ankles and 3+ reflexes in knees. Psych:  Sleepy, good eye contact. Flat affect and subdued.  memory impairment.     Impression & Recommendations:  Problem # 1:  WRIST PAIN, RIGHT (ICD-719.43) related to chronic degenerative joint disease and gout. Consider wrist injection. Orders: Provider Misc Charge- [facility] (Misc)  Problem # 2:  SPONDYLOSIS, LUMBAR W/MYELOPATHY (ICD-721.42) prevents ambulation  Problem # 3:  DEPRESSIVE TYPE PSYCHOSIS (ICD-298.0) stable [facility]: [facility] ([facility]  Problem # 4:  HYPERTENSION, BENIGN ESSENTIAL (ICD-401.1) controlled Her updated medication list for this problem includes:    Norvasc 10 Mg Tabs (Amlodipine besylate) .Marland Kitchen... Daily  [facility]  Problem # 5:  DEMENTIA, SENILE (ICD-290.0) Assessment: Unchanged  Orders: Provider Misc Charge- [facility] (Misc)  Complete Medication List: 1)  Lipitor 40 Mg Tabs (Atorvastatin calcium) .... Daily 2)  Norvasc 10 Mg Tabs (Amlodipine besylate) .... Daily 3)  Synthroid 50 Mcg Tabs  (Levothyroxine sodium) .... Once daily 4)  Therapeutic Multivitamin Tabs (Multiple vitamin) .... Once daily 5)  Apap 500 Mg Caps (Acetaminophen) .Marland Kitchen.. 1000 mg three times a day 6)  Oxycodone Hcl 5 Mg Tabs (Oxycodone hcl) .... Three times a day as needed pain 7)  Gabapentin 300 Mg Caps (Gabapentin) .... 2 by mouth tid 8)  Aricept 10 Mg Tabs (Donepezil hcl) .... One by mouth q hs 9)  Aspirin 81 Mg Tbec (Aspirin) .... Once daily 10)  Omeprazole 20 Mg Cpdr (Omeprazole) .... Two daily (40mg ) 11)  Trazodone Hcl 50 Mg Tabs (Trazodone hcl) .... One at bedtime 12)  Colcrys 0.6 Mg Tabs (Colchicine) .... Once daily 13)  Allopurinol 300 Mg Tabs (Allopurinol) .... Once daily 14)  Oxycontin 10 Mg Xr12h-tab (Oxycodone hcl) .Marland Kitchen.. 1 two times a day 15)  Calcarb 600 1500 Mg Tabs (Calcium carbonate) .... Two times a day 16)  Cymbalta 60 Mg Cpep (Duloxetine hcl) .... Once daily 17)  Hydrocortisone 2.5 % Crea (Hydrocortisone) .... Apply twice daily as needed itching 18)  Pro-stat 64 Liqd (Amino acids-protein hydrolys) .... 30ml three times a day 19)  Tucks Anti-itch 1 % Oint (Hydrocortisone acetate) .... As needed for pain 20)  Polar Freeze 3.5-0.8 % Gel (Menthol-camphor) .... Apply to knees and hip as needed pain 21)  Miralax Powd (Polyethylene glycol 3350) .Marland KitchenMarland KitchenMarland Kitchen 17 grams in 8 ounces of water daily 22)  Seroquel 25 Mg Tabs (Quetiapine fumarate) .... 3 two times a day 23)  Vitamin D (ergocalciferol) 50000 Unit Caps (Ergocalciferol) .... Q month 24)  Klor-con 20 Meq Pack (Potassium chloride) .... Once daily   Orders Added: 1)  Provider Misc Charge- [facility] [Misc]       Prevention & Chronic Care Immunizations   Influenza vaccine: 09/04/09  (08/02/2010)   Influenza vaccine deferral: Not available  (05/05/2010)   Influenza vaccine due: 08/12/2009    Tetanus booster: Not documented   Td booster deferral: Not indicated  (06/10/2010)    Pneumococcal vaccine: Not documented   Pneumococcal vaccine deferral: Not  indicated  (08/02/2010)    H. zoster vaccine: Not documented  Colorectal Screening   Hemoccult: Not documented   Hemoccult due: Not Indicated    Colonoscopy: 04/14/04  (08/02/2010)   Colonoscopy due: Not Indicated  Other Screening   Pap smear: Not documented   Pap smear due: Not Indicated    Mammogram: Not documented   Mammogram due: Not Indicated    DXA bone density scan: Not documented   DXA scan due: Not Indicated    Smoking status: quit  (01/29/2009)  Lipids   Total Cholesterol: 140  (06/29/2010)   Lipid panel action/deferral: Not indicated   LDL: 78  (06/29/2010)   LDL Direct: Not documented   HDL: 40  (06/29/2010)   Triglycerides: 108  (06/29/2010)    SGOT (AST): Not documented   SGPT (ALT): Not documented   Alkaline phosphatase: Not documented   Total bilirubin: Not documented  Hypertension   Last Blood Pressure: 140 / 85  (05/16/2010)   Serum creatinine: 1.09  (07/10/2010)   Serum potassium 4.5  (07/10/2010)  Self-Management Support :   Personal Goals (by the next clinic visit) :      Personal blood pressure goal: 140/90  (05/05/2010)     Personal LDL goal: 70  (05/05/2010)    Hypertension self-management support: Written self-care plan  (08/02/2010)    Lipid self-management support: Written self-care plan  (08/02/2010)     Appended Document: NH visit     Vital Signs:  Patient profile:   77 year old female BP supine:   140 / 78  Allergies: 1)  Hydrochlorothiazide   Complete Medication List: 1)  Lipitor 40 Mg Tabs (Atorvastatin calcium) .... Daily 2)  Norvasc 10 Mg Tabs (Amlodipine besylate) .... Daily 3)  Synthroid 50 Mcg Tabs (Levothyroxine sodium) .... Once daily 4)  Therapeutic Multivitamin Tabs (Multiple vitamin) .... Once daily 5)  Apap 500 Mg Caps (Acetaminophen) .Marland Kitchen.. 1000 mg three times a day 6)  Oxycodone Hcl 5 Mg Tabs (Oxycodone hcl) .... Three times a day as needed pain 7)  Gabapentin 300 Mg Caps (Gabapentin) .... 2 by mouth  tid 8)  Aricept 10 Mg Tabs (Donepezil hcl) .... One by mouth q hs 9)  Aspirin 81 Mg Tbec (Aspirin) .... Once daily 10)  Omeprazole 20 Mg Cpdr (Omeprazole) .... Two daily (40mg ) 11)  Trazodone Hcl 50 Mg Tabs (Trazodone hcl) .... One at bedtime 12)  Colcrys 0.6 Mg Tabs (Colchicine) .... Once daily 13)  Allopurinol 300 Mg Tabs (Allopurinol) .... Once daily 14)  Oxycontin 10 Mg Xr12h-tab (Oxycodone hcl) .Marland Kitchen.. 1 two times a day 15)  Calcarb 600 1500 Mg Tabs (Calcium carbonate) .... Two times a day 16)  Cymbalta 60 Mg Cpep (Duloxetine hcl) .... Once daily 17)  Hydrocortisone 2.5 % Crea (Hydrocortisone) .... Apply twice daily as needed itching 18)  Pro-stat 64 Liqd (Amino acids-protein hydrolys) .... 30ml three times a day 19)  Tucks Anti-itch 1 % Oint (Hydrocortisone acetate) .... As needed for pain 20)  Polar Freeze 3.5-0.8 % Gel (Menthol-camphor) .... Apply to knees and hip as needed pain 21)  Miralax Powd (Polyethylene glycol 3350) .Marland KitchenMarland KitchenMarland Kitchen 17 grams in 8 ounces of water daily 22)  Seroquel 25 Mg Tabs (Quetiapine fumarate) .... 3 two times a day 23)  Vitamin D (ergocalciferol) 50000 Unit Caps (Ergocalciferol) .... Q month 24)  Klor-con 20 Meq Pack (Potassium chloride) .... Once daily      Prevention & Chronic Care Immunizations   Influenza vaccine: Given at [facility]  (08/17/2010)   Influenza vaccine deferral: Not available  (05/05/2010)   Influenza vaccine due: 08/12/2009    Tetanus booster: Not documented   Td booster deferral: Not indicated  (06/10/2010)    Pneumococcal vaccine: Given at [facility]  (05/15/2008)   Pneumococcal vaccine deferral: Not indicated  (08/02/2010)    H. zoster vaccine: Not documented  Colorectal Screening   Hemoccult: Not documented   Hemoccult due: Not Indicated    Colonoscopy: 04/14/04  (08/02/2010)   Colonoscopy due: Not Indicated  Other Screening   Pap smear: Not documented   Pap smear due: Not Indicated    Mammogram: Not documented   Mammogram due: Not  Indicated    DXA bone density scan: Not documented   DXA scan due: Not Indicated    Smoking status: quit  (01/29/2009)  Lipids   Total Cholesterol: 140  (06/29/2010)   Lipid panel action/deferral: Not indicated   LDL: 78  (06/29/2010)   LDL Direct: Not documented   HDL: 40  (06/29/2010)   Triglycerides: 108  (06/29/2010)    SGOT (AST): Not documented   SGPT (ALT): Not documented   Alkaline phosphatase: Not documented   Total bilirubin: Not documented  Hypertension   Last Blood Pressure: 140 / 85  (05/16/2010)   Serum creatinine: 1.09  (07/10/2010)   Serum potassium 4.5  (07/10/2010)  Self-Management Support :   Personal Goals (by the next clinic visit) :      Personal blood pressure goal: 140/90  (05/05/2010)     Personal LDL goal: 70  (05/05/2010)    Hypertension self-management support: Written self-care plan  (08/02/2010)    Lipid self-management support: Written self-care plan  (08/02/2010)

## 2010-12-10 NOTE — Miscellaneous (Signed)
Summary: NH visit review  Clinical Lists Changes I visited her, discussed her care, and reviewed the progress note of Raymon Mutton, [place] from January   Orders: Added new Test order of Provider Misc Charge- [facility] (Misc) - Signed

## 2010-12-10 NOTE — Assessment & Plan Note (Signed)
Summary: NH visit      Primary Care Provider:  Helane Rima DO   History of Present Illness: More drowsy and spending most of time in bed. She complains of  pain right wrist and feet so that she cannot walk   Physical Exam  General:  Drowsy, lying in bed. doesn't appear to feel well. Heart:  normal rate, regular rhythm, and no murmur.   Abdomen:  soft, non-tender, and no distention.  Obese Msk:  Very tender boggy area over distal right ulnar-carpal joint. Painful for her to move.  Extremities:  1+ pitting edema to upper shin, feet warm, venous stasis changes, no open lesions.    Impression & Recommendations:  Problem # 1:  WRIST PAIN, RIGHT (ICD-719.43) Persists despite splinting. On Colcrys and Euloric Orders: Actor- [facility] (Misc) Provider Misc Charge- [facility] (Misc)  Problem # 2:  WEN (ICD-706.2) Assessment: Improved resolved  Problem # 3:  DEPRESSIVE TYPE PSYCHOSIS (ICD-298.0) Assessment: Unchanged Becoming more bedridden Orders: Provider Misc Charge- [facility] (Misc) Provider Misc Charge- [facility] (Misc)  Problem # 4:  DEMENTIA, SENILE (ICD-290.0)  Orders: Provider Misc Charge- [facility] (Misc) Provider Misc Charge- [facility] (Misc)  Complete Medication List: 1)  Lipitor 40 Mg Tabs (Atorvastatin calcium) .... Daily 2)  Norvasc 10 Mg Tabs (Amlodipine besylate) .... Daily 3)  Synthroid 50 Mcg Tabs (Levothyroxine sodium) .... Once daily 4)  Therapeutic Multivitamin Tabs (Multiple vitamin) .... Once daily 5)  Apap 500 Mg Caps (Acetaminophen) .Marland Kitchen.. 1000 mg three times a day 6)  Miralax Powd (Polyethylene glycol 3350) .Marland Kitchen.. 17g by mouth daily 7)  Oxycodone Hcl 5 Mg Tabs (Oxycodone hcl) .... Take 2 tab two times a day 8)  Alendronate Sodium 70 Mg Tabs (Alendronate sodium) .... One q week 9)  Klor-con M20 20 Meq Tbcr (Potassium chloride crys cr) .... One daily 10)  Gabapentin 300 Mg Caps (Gabapentin) .Marland Kitchen.. 1 by mouth tid 11)  Seroquel 100 Mg Tabs (Quetiapine fumarate) .... 50mg   in the am  100mg  in the pm 12)  Zoloft 100 Mg Tabs (Sertraline hcl) .... 150mg  daily take 1 and 1/2 tablets 13)  Oscal 500/200 D-3 500-200 Mg-unit Tabs (Calcium-vitamin d) .... One two times a day 14)  Aricept 10 Mg Tabs (Donepezil hcl) .... One by mouth q hs 15)  Loperamide Hcl 2 Mg Tabs (Loperamide hcl) .... As needed 16)  Aspirin 81 Mg Tbec (Aspirin) .... Once daily 17)  Omeprazole 20 Mg Cpdr (Omeprazole) .... Two daily (40mg ) 18)  Vitamin D (ergocalciferol) 50000 Unit Caps (Ergocalciferol) .... Once monthly 19)  Trazodone Hcl 50 Mg Tabs (Trazodone hcl) .... 1/2 at bedtime (25mg ) 20)  Resource Beneprotein Pack (Protein) .Marland Kitchen.. 1 scoop three times a day 21)  Uloric 40 Mg Tabs (Febuxostat) .... Once daily 22)  Zinc Sulfate 220 Mg Tabs (Zinc sulfate) .... Qd 23)  Vitamin C 500 Mg Chew (Ascorbic acid) .... Two times a day 24)  Albuterol Sulfate (2.5 Mg/32ml) 0.083% Nebu (Albuterol sulfate) .... Three times a day as needed

## 2010-12-10 NOTE — Assessment & Plan Note (Signed)
Summary: PCP Nursing Home Visit     Primary Care Provider:  Helane Rima DO  CC:  PCP NH Visit - Room 301B.  History of Present Illness: 1. Hemorrhoids: Rx Miralax, Tucks, Hydrocortisone. Improving.  2. Schizophrenia / Dementia: Rx Seroquel, Aricept, Cymbalta.   3. Chronic cystitis: Pt with Hx multiple UTIs. Followed by [facility]. Do not get UA unless systemic symptoms.  4. Chronic Pain: Dx Gout, Back Pain, Hx PMR - Continues to c/o pain chronically. On Tylenol, Gabapentin, and Oxycontin.  Weaned from prednisone with no change in function.  5. L femur frx: Pt with fall 4/409 with L femur frx; s/p ORIF 02/14/08 by Dr Shon Baton.  No longer on coumadin. Repeat xray of leg per ortho showed separation at repair site. Pt no longer ambulatory. Rx ASA, Calcium, Vitamin D, Bisphosphonate.  6. Hypothyroidism: Rx Synthroid. last TSH 12/20/09 = 1.182  7. HTN: Rx Norvasc. BPs stable.  8. Nutrition/Weight Loss:  Rx: Vitamin C,  Beneprotein, Med Pass supplements, ST, dysphagia diet.  9. HLD: Rx Lipitor.   10. Dyspepsia: Rx Omeprazole.  11. CKD: Unchanged.  12. NOTES: Patient has [employer]. DNR/DNI.    Current Medications (verified): 1)  Lipitor 40 Mg Tabs (Atorvastatin Calcium) .... Daily 2)  Norvasc 10 Mg  Tabs (Amlodipine Besylate) .... Daily 3)  Synthroid 50 Mcg  Tabs (Levothyroxine Sodium) .... Once Daily 4)  Therapeutic Multivitamin   Tabs (Multiple Vitamin) .... Once Daily 5)  Apap 500 Mg  Caps (Acetaminophen) .Marland Kitchen.. 1000 Mg Three Times A Day 6)  Oxycodone Hcl 5 Mg  Tabs (Oxycodone Hcl) .... Q Noon and Q4:00pm and Midnight If Needed 7)  Alendronate Sodium 70 Mg  Tabs (Alendronate Sodium) .... One Q Week 8)  Klor-Con M20 20 Meq  Tbcr (Potassium Chloride Crys Cr) .... One Daily 9)  Gabapentin 300 Mg  Caps (Gabapentin) .... 2 By Mouth Tid 10)  Aricept 10 Mg Tabs (Donepezil Hcl) .... One By Mouth Q Hs 11)  Aspirin 81 Mg Tbec (Aspirin) .... Once Daily 12)  Omeprazole 20 Mg Cpdr  (Omeprazole) .... Two Daily (40mg ) 13)  Vitamin D (Ergocalciferol) 50000 Unit Caps (Ergocalciferol) .... Once Monthly 14)  Trazodone Hcl 50 Mg Tabs (Trazodone Hcl) .... 1/2 Tab At Bedtime (25mg ) 15)  Seroquel 50 Mg Tabs (Quetiapine Fumarate) .... Two Times A Day 16)  Colcrys 0.6 Mg Tabs (Colchicine) .... Once Daily 17)  Allopurinol 300 Mg Tabs (Allopurinol) .... Once Daily 18)  Oxycontin 10 Mg Xr12h-Tab (Oxycodone Hcl) .Marland Kitchen.. 1 Two Times A Day 19)  Calcarb 600 1500 Mg Tabs (Calcium Carbonate) .... Two Times A Day 20)  Vitamin C Cr 500 Mg Cr-Tabs (Ascorbic Acid) .... Two Times A Day 21)  Cymbalta 60 Mg Cpep (Duloxetine Hcl) .... Once Daily 22)  Hydrocortisone 2.5 % Crea (Hydrocortisone) .... Apply Twice Daily 23)  Pro-Stat 64  Liqd (Amino Acids-Protein Hydrolys) .... 30ml Three Times A Day  Allergies (verified): 1)  Hydrochlorothiazide  Past History:  Past medical, surgical, family and social histories (including risk factors) reviewed, and no changes noted (except as noted below).  Past Medical History: Reviewed history from 02/18/2010 and no changes required. G4P4 Diverticulosis and ext. hemorrhoids on colonoscopy 7/05 Hypothyroidism Hypertension Polymyalgia Rheumatica Restless Leg Syndrome Gout Depression H/O Anemia Peripheral Edema Hyperlipidemia Constipation Urge Incontinence with recurrent UTI ? CKD Herpes Zoster Weight Loss  Past Surgical History: Reviewed history from 03/18/2008 and no changes required. Umbilical herniorrhaphy TAH with BSO for fibroids age 11's Cervical 3-4 diskectomy with  fusion for myelopathy 2006 Lumbar 1-2 stenosis decompressed 05/04/07 ORIF of Left femur on 02/14/08 by Dr Shon Baton  Family History: Reviewed history from 05/26/2007 and no changes required. Mother died of CHF age 57 Family History of CAD Female 1st degree relative <50 Father died Family History Kidney disease brother died Alzheimers - sister died 12 siblings, 2 living  Social  History: Reviewed history from 05/19/2009 and no changes required. Retired from [employer]. Widowed, husband died 02/02/2007. One son was killed in Geneticist, molecular in [place] during [employer]. Has a very supportive son that is a Optician, dispensing that visits her almost daily. Never Smoked. No ETOH use.   Impression & Recommendations:  Problem # 1:  HEMORRHOIDS (ICD-455.6) Assessment New Improved per patient.  Problem # 2:  DEMENTIA, SENILE (ICD-290.0) Assessment: Unchanged Patient less interactive today than in the past. She is pleasantly demented.   Problem # 3:  CHRONIC PAIN SYNDROME (ICD-338.4) Assessment: Unchanged  Problem # 4:  HYPOTHYROIDISM (ICD-244.9) Assessment: Unchanged  Her updated medication list for this problem includes:    Synthroid 50 Mcg Tabs (Levothyroxine sodium) ..... Once daily  Problem # 5:  HYPERTENSION, BENIGN ESSENTIAL (ICD-401.1) Assessment: Unchanged  Her updated medication list for this problem includes:    Norvasc 10 Mg Tabs (Amlodipine besylate) .Marland Kitchen... Daily  Complete Medication List: 1)  Lipitor 40 Mg Tabs (Atorvastatin calcium) .... Daily 2)  Norvasc 10 Mg Tabs (Amlodipine besylate) .... Daily 3)  Synthroid 50 Mcg Tabs (Levothyroxine sodium) .... Once daily 4)  Therapeutic Multivitamin Tabs (Multiple vitamin) .... Once daily 5)  Apap 500 Mg Caps (Acetaminophen) .Marland Kitchen.. 1000 mg three times a day 6)  Oxycodone Hcl 5 Mg Tabs (Oxycodone hcl) .... Q noon and q4:00pm and midnight if needed 7)  Alendronate Sodium 70 Mg Tabs (Alendronate sodium) .... One q week 8)  Klor-con M20 20 Meq Tbcr (Potassium chloride crys cr) .... One daily 9)  Gabapentin 300 Mg Caps (Gabapentin) .... 2 by mouth tid 10)  Aricept 10 Mg Tabs (Donepezil hcl) .... One by mouth q hs 11)  Aspirin 81 Mg Tbec (Aspirin) .... Once daily 12)  Omeprazole 20 Mg Cpdr (Omeprazole) .... Two daily (40mg ) 13)  Vitamin D (ergocalciferol) 50000 Unit Caps (Ergocalciferol) .... Once monthly 14)  Trazodone  Hcl 50 Mg Tabs (Trazodone hcl) .... 1/2 tab at bedtime (25mg ) 15)  Seroquel 50 Mg Tabs (Quetiapine fumarate) .... Two times a day 16)  Colcrys 0.6 Mg Tabs (Colchicine) .... Once daily 17)  Allopurinol 300 Mg Tabs (Allopurinol) .... Once daily 18)  Oxycontin 10 Mg Xr12h-tab (Oxycodone hcl) .Marland Kitchen.. 1 two times a day 19)  Calcarb 600 1500 Mg Tabs (Calcium carbonate) .... Two times a day 20)  Vitamin C Cr 500 Mg Cr-tabs (Ascorbic acid) .... Two times a day 21)  Cymbalta 60 Mg Cpep (Duloxetine hcl) .... Once daily 22)  Hydrocortisone 2.5 % Crea (Hydrocortisone) .... Apply twice daily 23)  Pro-stat 64 Liqd (Amino acids-protein hydrolys) .... 30ml three times a day  Prevention & Chronic Care Immunizations   Influenza vaccine: given  (08/12/2008)   Influenza vaccine deferral: Not available  (05/05/2010)   Influenza vaccine due: 08/12/2009    Tetanus booster: Not documented    Pneumococcal vaccine: Not documented    H. zoster vaccine: Not documented  Colorectal Screening   Hemoccult: Not documented   Hemoccult due: Not Indicated    Colonoscopy: Not documented   Colonoscopy due: Not Indicated  Other Screening   Pap smear: Not documented  Pap smear due: Not Indicated    Mammogram: Not documented   Mammogram due: Not Indicated    DXA bone density scan: Not documented   DXA scan due: Not Indicated    Smoking status: quit  (01/29/2009)  Lipids   Total Cholesterol: Not documented   Lipid panel action/deferral: Not indicated   LDL: 53  (12/06/2009)   LDL Direct: Not documented   HDL: 30  (12/06/2009)   Triglycerides: Not documented    SGOT (AST): Not documented   SGPT (ALT): Not documented   Alkaline phosphatase: Not documented   Total bilirubin: Not documented    Lipid flowsheet reviewed?: Yes   Progress toward LDL goal: At goal  Hypertension   Last Blood Pressure: 124 / 86  (03/12/2009)   Serum creatinine: 0.97  (12/20/2009)   Serum potassium 4.2  (12/20/2009)     Hypertension flowsheet reviewed?: Yes   Progress toward BP goal: Unchanged  Self-Management Support :   Personal Goals (by the next clinic visit) :      Personal blood pressure goal: 140/90  (05/05/2010)     Personal LDL goal: 70  (05/05/2010)    Hypertension self-management support: Written self-care plan  (05/05/2010)   Hypertension self-care plan printed.    Lipid self-management support: Written self-care plan  (05/05/2010)   Lipid self-care plan printed.   Appended Document: Orders Update    Clinical Lists Changes  Problems: Removed problem of HERPES ZOSTER (ICD-053.9) Removed problem of WEN (ICD-706.2)

## 2010-12-10 NOTE — Miscellaneous (Signed)
Summary: Med Update - Rx  Clinical Lists Changes  Medications: Removed medication of ULORIC 40 MG TABS (FEBUXOSTAT) once daily Added new medication of ALLOPURINOL 300 MG TABS (ALLOPURINOL) once daily - Signed Rx of ALLOPURINOL 300 MG TABS (ALLOPURINOL) once daily;  #1 x 0;  Signed;  Entered by: Christian Mate D;  Authorized by: Madelon Lips Pharm D;  Method used: Historical    Prescriptions: ALLOPURINOL 300 MG TABS (ALLOPURINOL) once daily  #1 x 0   Entered and Authorized by:   Christian Mate D   Signed by:   Madelon Lips Pharm D on 02/11/2010   Method used:   Historical   RxID:   [id number]

## 2010-12-10 NOTE — Miscellaneous (Signed)
Summary: Med Update - Rx  Clinical Lists Changes  Medications: Removed medication of ALLOPURINOL 100 MG  TABS (ALLOPURINOL) 2 tablets daily Added new medication of ULORIC 40 MG TABS (FEBUXOSTAT) once daily - Signed Removed medication of COLCHICINE 0.6 MG TABS (COLCHICINE) 1 tab by mouth two times a day for gout. Removed medication of FUROSEMIDE 20 MG  TABS (FUROSEMIDE) 2 tablets once daily Changed medication from OXYCODONE HCL 5 MG  TABS (OXYCODONE HCL) Take 1 tab q 4hr as needed pain to OXYCODONE HCL 5 MG  TABS (OXYCODONE HCL) Take 2 tab two times a day - Signed Removed medication of OXYCODONE HCL 10 MG TABS (OXYCODONE HCL) Q4 hours for moderate to severe pain. Changed medication from TRAZODONE HCL 50 MG TABS (TRAZODONE HCL) at bedtime to TRAZODONE HCL 50 MG TABS (TRAZODONE HCL) 1/2 at bedtime (25mg ) - Signed Added new medication of ZINC SULFATE 220 MG TABS (ZINC SULFATE) qd - Signed Added new medication of VITAMIN C 500 MG CHEW (ASCORBIC ACID) two times a day - Signed Added new medication of ALBUTEROL SULFATE (2.5 MG/3ML) 0.083% NEBU (ALBUTEROL SULFATE) three times a day as needed - Signed Rx of ULORIC 40 MG TABS (FEBUXOSTAT) once daily;  #1 x 0;  Signed;  Entered by: Christian Mate D;  Authorized by: Madelon Lips Pharm D;  Method used: Historical Rx of OXYCODONE HCL 5 MG  TABS (OXYCODONE HCL) Take 2 tab two times a day;  #1 x 0;  Signed;  Entered by: Christian Mate D;  Authorized by: Madelon Lips Pharm D;  Method used: Historical Rx of TRAZODONE HCL 50 MG TABS (TRAZODONE HCL) 1/2 at bedtime (25mg );  #1 x 0;  Signed;  Entered by: Christian Mate D;  Authorized by: Madelon Lips Pharm D;  Method used: Historical Rx of ZINC SULFATE 220 MG TABS (ZINC SULFATE) qd;  #1 x 0;  Signed;  Entered by: Christian Mate D;  Authorized by: Madelon Lips Pharm D;  Method used: Historical Rx of VITAMIN C 500 MG CHEW (ASCORBIC ACID) two times a day;  #1 x 0;  Signed;  Entered by: Christian Mate D;   Authorized by: Madelon Lips Pharm D;  Method used: Historical Rx of ALBUTEROL SULFATE (2.5 MG/3ML) 0.083% NEBU (ALBUTEROL SULFATE) three times a day as needed;  #1 x 0;  Signed;  Entered by: Christian Mate D;  Authorized by: Madelon Lips Pharm D;  Method used: Historical    Prescriptions: ALBUTEROL SULFATE (2.5 MG/3ML) 0.083% NEBU (ALBUTEROL SULFATE) three times a day as needed  #1 x 0   Entered and Authorized by:   Christian Mate D   Signed by:   Madelon Lips Pharm D on 11/19/2009   Method used:   Historical   RxID:   [id number] VITAMIN C 500 MG CHEW (ASCORBIC ACID) two times a day  #1 x 0   Entered and Authorized by:   Christian Mate D   Signed by:   Madelon Lips Pharm D on 11/19/2009   Method used:   Historical   RxID:   [id number] ZINC SULFATE 220 MG TABS (ZINC SULFATE) qd  #1 x 0   Entered and Authorized by:   Christian Mate D   Signed by:   Madelon Lips Pharm D on 11/19/2009   Method used:   Historical   RxID:   [id number] TRAZODONE HCL 50 MG TABS (TRAZODONE HCL) 1/2 at bedtime (25mg )  #1 x 0   Entered and  Authorized by:   Madelon Lips Pharm D   Signed by:   Madelon Lips Pharm D on 11/19/2009   Method used:   Historical   RxID:   [id number] OXYCODONE HCL 5 MG  TABS (OXYCODONE HCL) Take 2 tab two times a day  #1 x 0   Entered and Authorized by:   Christian Mate D   Signed by:   Madelon Lips Pharm D on 11/19/2009   Method used:   Historical   RxID:   [id number] ULORIC 40 MG TABS (FEBUXOSTAT) once daily  #1 x 0   Entered and Authorized by:   Christian Mate D   Signed by:   Madelon Lips Pharm D on 11/19/2009   Method used:   Historical   RxID:   [id number]

## 2010-12-10 NOTE — Progress Notes (Signed)
Summary: UPDATE PROBLEM LIST   

## 2010-12-10 NOTE — Assessment & Plan Note (Signed)
Summary: NH visit   Vital Signs:  Patient profile:   77 year old female Height:      68 inches  Primary Care Provider:  Helane Rima DO   History of Present Illness: complains of  pain lateral lower left leg. Says she is sleeping poorly.   Allergies: 1)  Hydrochlorothiazide  Physical Exam  General:  Sleeping lying in bed. Awakened easily. Foam Geomats under both legs Head:  No recurrence of wen Heart:  Normal rate, regular rhythm, and no murmur.   Abdomen:  Soft, non-tender, and no distention.  Obese. Msk:  Tender but no longer swollen  area over distal right wrist. Less painful for her to move, but unable to dorsiflex past neutral.   complains of  pain with passive movement of legs Neurologic:  Drowsy, disoriented, but pleasant and generally appropriate responses. Clonus both ankles and 3+ reflexes in knees. Skin:  2 Tender scabbed areas about 1 cm diameter linearly bilateral lower legs overlying the mid to distal fibula   Impression & Recommendations:  Problem # 1:  PRESSURE ULCER OTHER SITE (ICD-707.09) vs superficial injuries. Discussed with skin care nurse who will treat and look for sources of injury (when transferred into chair?) Orders: Provider Misc Charge- [facility] (Misc)  Complete Medication List: 1)  Lipitor 40 Mg Tabs (Atorvastatin calcium) .... Daily 2)  Norvasc 10 Mg Tabs (Amlodipine besylate) .... Daily 3)  Synthroid 50 Mcg Tabs (Levothyroxine sodium) .... Once daily 4)  Therapeutic Multivitamin Tabs (Multiple vitamin) .... Once daily 5)  Apap 500 Mg Caps (Acetaminophen) .Marland Kitchen.. 1000 mg three times a day 6)  Oxycodone Hcl 5 Mg Tabs (Oxycodone hcl) .... Q noon and q4:00pm and midnight if needed 7)  Alendronate Sodium 70 Mg Tabs (Alendronate sodium) .... One q week 8)  Klor-con M20 20 Meq Tbcr (Potassium chloride crys cr) .... One daily 9)  Gabapentin 300 Mg Caps (Gabapentin) .... 2 by mouth tid 10)  Aricept 10 Mg Tabs (Donepezil hcl) .... One by mouth q hs 11)   Aspirin 81 Mg Tbec (Aspirin) .... Once daily 12)  Omeprazole 20 Mg Cpdr (Omeprazole) .... Two daily (40mg ) 13)  Vitamin D (ergocalciferol) 50000 Unit Caps (Ergocalciferol) .... Once monthly 14)  Trazodone Hcl 50 Mg Tabs (Trazodone hcl) .... 1/2 tab at bedtime (25mg ) 15)  Seroquel 50 Mg Tabs (Quetiapine fumarate) .... Two times a day 16)  Colcrys 0.6 Mg Tabs (Colchicine) .... Once daily 17)  Allopurinol 300 Mg Tabs (Allopurinol) .... Once daily 18)  Oxycontin 10 Mg Xr12h-tab (Oxycodone hcl) .Marland Kitchen.. 1 two times a day 19)  Calcarb 600 1500 Mg Tabs (Calcium carbonate) .... Two times a day 20)  Vitamin C Cr 500 Mg Cr-tabs (Ascorbic acid) .... Two times a day 21)  Cymbalta 60 Mg Cpep (Duloxetine hcl) .... Once daily 22)  Hydrocortisone 2.5 % Crea (Hydrocortisone) .... Apply twice daily 23)  Pro-stat 64 Liqd (Amino acids-protein hydrolys) .... 30ml three times a day   -  Date:  05/16/2010    SBP 140    DBP 85  Date:  05/08/2010    Weight 202.8  Date:  04/14/2010    Weight 199.8    Prevention & Chronic Care Immunizations   Influenza vaccine: given  (08/12/2008)   Influenza vaccine deferral: Not available  (05/05/2010)   Influenza vaccine due: 08/12/2009    Tetanus booster: Not documented   Td booster deferral: Not indicated  (06/10/2010)    Pneumococcal vaccine: Not documented    H. zoster  vaccine: Not documented  Colorectal Screening   Hemoccult: Not documented   Hemoccult due: Not Indicated    Colonoscopy: Not documented   Colonoscopy due: Not Indicated  Other Screening   Pap smear: Not documented   Pap smear due: Not Indicated    Mammogram: Not documented   Mammogram due: Not Indicated    DXA bone density scan: Not documented   DXA scan due: Not Indicated    Smoking status: quit  (01/29/2009)  Lipids   Total Cholesterol: Not documented   Lipid panel action/deferral: Not indicated   LDL: 53  (12/06/2009)   LDL Direct: Not documented   HDL: 30   (12/06/2009)   Triglycerides: Not documented    SGOT (AST): Not documented   SGPT (ALT): Not documented   Alkaline phosphatase: Not documented   Total bilirubin: Not documented    Lipid flowsheet reviewed?: Yes   Progress toward LDL goal: At goal  Hypertension   Last Blood Pressure: 140 / 85  (05/16/2010)   Serum creatinine: 0.97  (12/20/2009)   Serum potassium 4.2  (12/20/2009)    Hypertension flowsheet reviewed?: Yes   Progress toward BP goal: At goal  Self-Management Support :   Personal Goals (by the next clinic visit) :      Personal blood pressure goal: 140/90  (05/05/2010)     Personal LDL goal: 70  (05/05/2010)    Hypertension self-management support: Written self-care plan  (05/05/2010)    Lipid self-management support: Written self-care plan  (05/05/2010)

## 2010-12-10 NOTE — Miscellaneous (Signed)
Summary: Med Update - Rx  Clinical Lists Changes  Medications: Removed medication of LOPERAMIDE HCL 2 MG TABS (LOPERAMIDE HCL) as needed Removed medication of MIRALAX   POWD (POLYETHYLENE GLYCOL 3350) 17g by mouth daily Removed medication of OSCAL 500/200 D-3 500-200 MG-UNIT  TABS (CALCIUM-VITAMIN D) one two times a day Added new medication of CALCARB 600 1500 MG TABS (CALCIUM CARBONATE) two times a day - Signed Added new medication of VITAMIN C CR 500 MG CR-TABS (ASCORBIC ACID) two times a day - Signed Rx of CALCARB 600 1500 MG TABS (CALCIUM CARBONATE) two times a day;  #1 x 0;  Signed;  Entered by: Christian Mate D;  Authorized by: Madelon Lips Pharm D;  Method used: Historical Rx of VITAMIN C CR 500 MG CR-TABS (ASCORBIC ACID) two times a day;  #1 x 0;  Signed;  Entered by: Christian Mate D;  Authorized by: Madelon Lips Pharm D;  Method used: Historical    Prescriptions: VITAMIN C CR 500 MG CR-TABS (ASCORBIC ACID) two times a day  #1 x 0   Entered and Authorized by:   Christian Mate D   Signed by:   Madelon Lips Pharm D on 03/11/2010   Method used:   Historical   RxID:   [id number] CALCARB 600 1500 MG TABS (CALCIUM CARBONATE) two times a day  #1 x 0   Entered and Authorized by:   Christian Mate D   Signed by:   Madelon Lips Pharm D on 03/11/2010   Method used:   Historical   RxID:   [id number]

## 2010-12-10 NOTE — Assessment & Plan Note (Signed)
Summary: PCP Visit   Primary Care Provider:  Helane Rima DO  CC:  PCP Visit.  History of Present Illness: Chronic Issues: 1. Hemorrhoids: Rx Miralax, Tucks, Hydrocortisone.   2. Schizophrenia / Dementia: Rx Seroquel, Aricept, Cymbalta. MMSE 22/30.  3. Chronic cystitis: Pt with Hx multiple UTIs. Followed by [facility]. Do not get UA unless systemic symptoms.  4. Chronic Pain: Dx Gout, Back Pain, Hx PMR - Continues to c/o pain chronically. On Tylenol, Gabapentin, and Oxycontin.  Weaned from prednisone with no change in function. Now in OT 3x/week. NOTE: PATIENT HAS CHRONIC RIGHT HAND PAIN.  5. L femur frx: Pt with fall 4/409 with L femur frx; s/p ORIF 02/14/08 by Dr Shon Baton.  No longer on coumadin. Repeat xray of leg per ortho showed separation at repair site. Pt no longer ambulatory. Rx ASA, Calcium, Vitamin D, Bisphosphonate.  6. Hypothyroidism: Rx Synthroid. last TSH 05/26/10 = 1.257   7. HTN: Rx Norvasc. BPs stable.  8. Nutrition/Weight Loss:  Rx: Vitamin C,  Beneprotein, Med Pass supplements, ST, dysphagia diet.  9. HLD: Rx Lipitor. chol 140, trig 108, HDL 40, LDL 78.  10. Dyspepsia: Rx Omeprazole.  11. CKD: Unchanged. Creatinine 1.09 (07/10/10).  12. NOTES: Patient has [employer]. DNR/DNI.    Current Medications (verified): 1)  Lipitor 40 Mg Tabs (Atorvastatin Calcium) .... Daily 2)  Norvasc 10 Mg  Tabs (Amlodipine Besylate) .... Daily 3)  Synthroid 50 Mcg  Tabs (Levothyroxine Sodium) .... Once Daily 4)  Therapeutic Multivitamin   Tabs (Multiple Vitamin) .... Once Daily 5)  Apap 500 Mg  Caps (Acetaminophen) .Marland Kitchen.. 1000 Mg Three Times A Day 6)  Oxycodone Hcl 5 Mg  Tabs (Oxycodone Hcl) .... Q Noon and Q4:00pm and Midnight If Needed 7)  Gabapentin 300 Mg  Caps (Gabapentin) .... 2 By Mouth Tid 8)  Aricept 10 Mg Tabs (Donepezil Hcl) .... One By Mouth Q Hs 9)  Aspirin 81 Mg Tbec (Aspirin) .... Once Daily 10)  Omeprazole 20 Mg Cpdr (Omeprazole) .... Two Daily (40mg ) 11)   Trazodone Hcl 50 Mg Tabs (Trazodone Hcl) .... 1/2 Tab At Bedtime (25mg ) 12)  Seroquel 50 Mg Tabs (Quetiapine Fumarate) .... Two Times A Day 13)  Colcrys 0.6 Mg Tabs (Colchicine) .... Once Daily 14)  Allopurinol 300 Mg Tabs (Allopurinol) .... Once Daily 15)  Oxycontin 10 Mg Xr12h-Tab (Oxycodone Hcl) .Marland Kitchen.. 1 Two Times A Day 16)  Calcarb 600 1500 Mg Tabs (Calcium Carbonate) .... Two Times A Day 17)  Cymbalta 60 Mg Cpep (Duloxetine Hcl) .... Once Daily 18)  Hydrocortisone 2.5 % Crea (Hydrocortisone) .... Apply Twice Daily As Needed Itching 19)  Pro-Stat 64  Liqd (Amino Acids-Protein Hydrolys) .... 30ml Three Times A Day 20)  Tucks Anti-Itch 1 % Oint (Hydrocortisone Acetate) .... As Needed For Pain 21)  Polar Freeze 3.5-0.8 % Gel (Menthol-Camphor) .... Apply To Knees and Hip As Needed Pain 22)  Miralax  Powd (Polyethylene Glycol 3350) .Marland KitchenMarland KitchenMarland Kitchen 17 Grams in 8 Ounces of Water Daily 23)  Vitamin D3 1000 Unit Tabs (Cholecalciferol) .... Once Daily  Allergies (verified): 1)  Hydrochlorothiazide PMH-FH-SH reviewed for relevance  Review of Systems General:  Denies chills and fever. CV:  Denies chest pain or discomfort, shortness of breath with exertion, and swelling of feet. Resp:  Denies cough. GI:  Denies abdominal pain, constipation, diarrhea, nausea, and vomiting. MS:  Complains of joint pain; denies joint redness and joint swelling.  Physical Exam  General:  Awake, lying in bed. Foam Geomats under both legs.  Vitals reviewed. Lungs:  Normal respiratory effort and normal breath sounds.   Heart:  Normal rate, regular rhythm, and no murmur.   Abdomen:  Soft, non-tender, and no distention.  Obese. Msk:  Tender but not swollen area over distal right wrist. Less painful for her to move, but unable to dorsiflex past neutral. Complains of  pain with passive movement of legs. Pulses:  Decreased but palpable DP, feet warm. Extremities:  Trace edema, feet warm, venous stasis changes, no open  lesions.   Impression & Recommendations:  Problem # 1:  DEPRESSIVE TYPE PSYCHOSIS (ICD-298.0) Assessment Unchanged Continue current treatment.  Problem # 2:  CHRONIC PAIN SYNDROME (ICD-338.4) Assessment: Unchanged Right wrist pain is chronic. Continue current treatment.  Complete Medication List: 1)  Lipitor 40 Mg Tabs (Atorvastatin calcium) .... Daily 2)  Norvasc 10 Mg Tabs (Amlodipine besylate) .... Daily 3)  Synthroid 50 Mcg Tabs (Levothyroxine sodium) .... Once daily 4)  Therapeutic Multivitamin Tabs (Multiple vitamin) .... Once daily 5)  Apap 500 Mg Caps (Acetaminophen) .Marland Kitchen.. 1000 mg three times a day 6)  Oxycodone Hcl 5 Mg Tabs (Oxycodone hcl) .... Q noon and q4:00pm and midnight if needed 7)  Gabapentin 300 Mg Caps (Gabapentin) .... 2 by mouth tid 8)  Aricept 10 Mg Tabs (Donepezil hcl) .... One by mouth q hs 9)  Aspirin 81 Mg Tbec (Aspirin) .... Once daily 10)  Omeprazole 20 Mg Cpdr (Omeprazole) .... Two daily (40mg ) 11)  Trazodone Hcl 50 Mg Tabs (Trazodone hcl) .... 1/2 tab at bedtime (25mg ) 12)  Seroquel 50 Mg Tabs (Quetiapine fumarate) .... Two times a day 13)  Colcrys 0.6 Mg Tabs (Colchicine) .... Once daily 14)  Allopurinol 300 Mg Tabs (Allopurinol) .... Once daily 15)  Oxycontin 10 Mg Xr12h-tab (Oxycodone hcl) .Marland Kitchen.. 1 two times a day 16)  Calcarb 600 1500 Mg Tabs (Calcium carbonate) .... Two times a day 17)  Cymbalta 60 Mg Cpep (Duloxetine hcl) .... Once daily 18)  Hydrocortisone 2.5 % Crea (Hydrocortisone) .... Apply twice daily as needed itching 19)  Pro-stat 64 Liqd (Amino acids-protein hydrolys) .... 30ml three times a day 20)  Tucks Anti-itch 1 % Oint (Hydrocortisone acetate) .... As needed for pain 21)  Polar Freeze 3.5-0.8 % Gel (Menthol-camphor) .... Apply to knees and hip as needed pain 22)  Miralax Powd (Polyethylene glycol 3350) .Marland KitchenMarland KitchenMarland Kitchen 17 grams in 8 ounces of water daily 23)  Vitamin D3 1000 Unit Tabs (Cholecalciferol) .... Once daily  Appended Document: PCP  Visit     Allergies: 1)  Hydrochlorothiazide   Complete Medication List: 1)  Lipitor 40 Mg Tabs (Atorvastatin calcium) .... Daily 2)  Norvasc 10 Mg Tabs (Amlodipine besylate) .... Daily 3)  Synthroid 50 Mcg Tabs (Levothyroxine sodium) .... Once daily 4)  Therapeutic Multivitamin Tabs (Multiple vitamin) .... Once daily 5)  Apap 500 Mg Caps (Acetaminophen) .Marland Kitchen.. 1000 mg three times a day 6)  Oxycodone Hcl 5 Mg Tabs (Oxycodone hcl) .... Three times a day as needed pain 7)  Gabapentin 300 Mg Caps (Gabapentin) .... 2 by mouth tid 8)  Aricept 10 Mg Tabs (Donepezil hcl) .... One by mouth q hs 9)  Aspirin 81 Mg Tbec (Aspirin) .... Once daily 10)  Omeprazole 20 Mg Cpdr (Omeprazole) .... Two daily (40mg ) 11)  Trazodone Hcl 50 Mg Tabs (Trazodone hcl) .... One at bedtime 12)  Colcrys 0.6 Mg Tabs (Colchicine) .... Once daily 13)  Allopurinol 300 Mg Tabs (Allopurinol) .... Once daily 14)  Oxycontin 10 Mg Xr12h-tab (Oxycodone hcl) .Marland KitchenMarland KitchenMarland Kitchen  1 two times a day 15)  Calcarb 600 1500 Mg Tabs (Calcium carbonate) .... Two times a day 16)  Cymbalta 60 Mg Cpep (Duloxetine hcl) .... Once daily 17)  Hydrocortisone 2.5 % Crea (Hydrocortisone) .... Apply twice daily as needed itching 18)  Pro-stat 64 Liqd (Amino acids-protein hydrolys) .... 30ml three times a day 19)  Tucks Anti-itch 1 % Oint (Hydrocortisone acetate) .... As needed for pain 20)  Polar Freeze 3.5-0.8 % Gel (Menthol-camphor) .... Apply to knees and hip as needed pain 21)  Miralax Powd (Polyethylene glycol 3350) .Marland KitchenMarland KitchenMarland Kitchen 17 grams in 8 ounces of water daily 22)  Seroquel 25 Mg Tabs (Quetiapine fumarate) .... 3 two times a day 23)  Vitamin D (ergocalciferol) 50000 Unit Caps (Ergocalciferol) .... Q month 24)  Klor-con 20 Meq Pack (Potassium chloride) .... Once daily      Prevention & Chronic Care Immunizations   Influenza vaccine: 09/04/09  (08/02/2010)   Influenza vaccine deferral: Not available  (05/05/2010)   Influenza vaccine due: 08/12/2009     Tetanus booster: Not documented   Td booster deferral: Not indicated  (06/10/2010)    Pneumococcal vaccine: Not documented   Pneumococcal vaccine deferral: Not indicated  (08/02/2010)    H. zoster vaccine: Not documented  Colorectal Screening   Hemoccult: Not documented   Hemoccult due: Not Indicated    Colonoscopy: 04/14/04  (08/02/2010)   Colonoscopy due: Not Indicated  Other Screening   Pap smear: Not documented   Pap smear due: Not Indicated    Mammogram: Not documented   Mammogram due: Not Indicated    DXA bone density scan: Not documented   DXA scan due: Not Indicated    Smoking status: quit  (01/29/2009)  Lipids   Total Cholesterol: 140  (06/29/2010)   Lipid panel action/deferral: Not indicated   LDL: 78  (06/29/2010)   LDL Direct: Not documented   HDL: 40  (06/29/2010)   Triglycerides: 108  (06/29/2010)    SGOT (AST): Not documented   SGPT (ALT): Not documented   Alkaline phosphatase: Not documented   Total bilirubin: Not documented    Lipid flowsheet reviewed?: Yes   Progress toward LDL goal: At goal  Hypertension   Last Blood Pressure: 140 / 85  (05/16/2010)   Serum creatinine: 1.09  (07/10/2010)   Serum potassium 4.5  (07/10/2010)    Hypertension flowsheet reviewed?: Yes   Progress toward BP goal: At goal  Self-Management Support :   Personal Goals (by the next clinic visit) :      Personal blood pressure goal: 140/90  (05/05/2010)     Personal LDL goal: 70  (05/05/2010)    Hypertension self-management support: Written self-care plan  (08/02/2010)    Lipid self-management support: Written self-care plan  (08/02/2010)

## 2010-12-10 NOTE — Miscellaneous (Signed)
Summary: wrist injection  Clinical Lists Changes  Observations: Added new observation of PEADULT: Tammy Soman  MD ~Additional Exam`PE comments (02/20/2010 14:27) Added new observation of PE COMMENTS: Patient given informed consent for injection. Appropriate verbal time out taken. Area cleaned and prepped in usual sterile fashion. 1- cc kennalog plus 1 cc 1% lidocaine without epinephrine was injected into the R radiocarpal joint. Patient tolerated procedure well without any apparent complications.  (02/20/2010 14:27) Added new observation of PRIMARY MD: Helane Rima DO (02/20/2010 14:27)       Physical Exam  Additional Exam:  Patient given informed consent for injection. Appropriate verbal time out taken. Area cleaned and prepped in usual sterile fashion. 1- cc kennalog plus 1 cc 1% lidocaine without epinephrine was injected into the R radiocarpal joint. Patient tolerated procedure well without any apparent complications.

## 2010-12-10 NOTE — Assessment & Plan Note (Signed)
Summary: PCP Nursing Home Visit   Primary Care Provider:  Helane Rima DO   History of Present Illness: Tammy Salinas is a 77 year old resident of [facility].   1. Leg edema with pain -  Rx Lasix 40mg  daily, Jobst stockings, Geri chair. Edema thought to be 2/2 postural and neurogenic causes versus cardiac. Prior increases in diuretics have led to increases in creatinine.   2. Delirium / Dementia / Depression - Rx Seroquel, Aricept, Sertraline. Patient had pm hallucinations with recent attempt to decrease antipsychotic.   3. Chronic cystitis - Pt with Hx multiple UTIs. Followed by [facility]. No symptoms in last few months.   4. Chronic pain - Continues to c/o pain chronically. On biofreeze, oxycontin, allopurinol/colchicine, prn oxycodone. Per son, pt likes to have attention. Weaned from prednisone with no change in function.  5. L femur frx - Pt with fall 4/409 with L femur frx; s/p ORIF 02/14/08 by Dr Shon Baton.  No longer on coumadin. Repeat xray of leg per ortho showed separation at repair site. Pt no longer ambulatory. Rx ASA 81 mg daily, calcium, vitamin D, bisphosphonate.  6. Hypothyroidism - last TSH 01/10/09 = 1.182  7. HTN - Rx Norvasc, Lasix. BPs stable.  8. Skin: Scalp Wem: I&D (06/01/09), culture showed no primary growth, still slightly tender, drains occasionally. Suprapubic Abscess: spontaneous drainage (07/03/09), +MRSA, Rx Doxy x 10 days, now resolved.  9. Nutrition: Rx: Vitamin C,  Beneprotein, Med Pass supplements.   10. Constipation: Rx Miralax as needed. Hx diverticulosis.   11. HLD: Rx Lipitor  12. Dyspepsia: Rx Omeprazole  13. Herpes Zoster: resolved  Current Medications (verified): 1)  Lipitor 40 Mg Tabs (Atorvastatin Calcium) .... Daily 2)  Norvasc 10 Mg  Tabs (Amlodipine Besylate) .... Daily 3)  Synthroid 50 Mcg  Tabs (Levothyroxine Sodium) .... Once Daily 4)  Therapeutic Multivitamin   Tabs (Multiple Vitamin) .... Once Daily 5)  Colchicine 0.6 Mg  Tabs  (Colchicine) .Marland Kitchen.. 1 By Mouth Qd 6)  Apap 500 Mg  Caps (Acetaminophen) .Marland Kitchen.. 1000 Mg Three Times A Day 7)  Allopurinol 100 Mg  Tabs (Allopurinol) .... 2 Tablets Daily 8)  Miralax   Powd (Polyethylene Glycol 3350) .Marland Kitchen.. 17g By Mouth Daily 9)  Oxycodone Hcl 5 Mg  Tabs (Oxycodone Hcl) .... Take 1 Tab Q 3hr As Needed Pain 10)  Furosemide 20 Mg  Tabs (Furosemide) .... 2 Tablets Two Times A Day 11)  Alendronate Sodium 70 Mg  Tabs (Alendronate Sodium) .... One Q Week 12)  Klor-Con M20 20 Meq  Tbcr (Potassium Chloride Crys Cr) .... One Daily 13)  Gabapentin 300 Mg  Caps (Gabapentin) .Marland Kitchen.. 1 By Mouth Tid 14)  Seroquel 100 Mg Tabs (Quetiapine Fumarate) .... 50mg  Two Times A Day 15)  Zoloft 100 Mg Tabs (Sertraline Hcl) .Marland Kitchen.. 1 Tab Daily 16)  Cvs Omeprazole 20 Mg Tbec (Omeprazole) .... Two Daily 17)  Oscal 500/200 D-3 500-200 Mg-Unit  Tabs (Calcium-Vitamin D) .... One Two Times A Day 18)  Vitamin D3 1000 Unit Caps (Cholecalciferol) .... One Daily 19)  Aricept 10 Mg Tabs (Donepezil Hcl) .... One By Mouth Q Hs 20)  Loperamide Hcl 2 Mg Tabs (Loperamide Hcl) .... As Needed 21)  Aspirin 81 Mg Tbec (Aspirin) .... Once Daily  Allergies (verified): 1)  Hydrochlorothiazide  Past History:  Past medical, surgical, family and social histories (including risk factors) reviewed, and no changes noted (except as noted below). Past medical, surgical, family and social histories (including risk factors) reviewed for  relevance to current acute and chronic problems.  Past Medical History: G4P4 Diverticulosis and ext. hemorrhoids on colonoscopy 7/05 Hypothyroidism Hypertension Polymyalgia Rheumatica Restless Leg Syndrome Gout Depression H/O Anemia Peripheral Edema Hyperlipidemia Constipation Urge Incontinence with recurrent UTI ?? CKD Herpes Zoster  Past Surgical History: Reviewed history from 03/18/2008 and no changes required. Umbilical herniorrhaphy TAH with BSO for fibroids age 45's Cervical 3-4  diskectomy with fusion for myelopathy 2006 Lumbar 1-2 stenosis decompressed 05/04/07 ORIF of Left femur on 02/14/08 by Dr Shon Baton  Family History: Reviewed history from 05/26/2007 and no changes required. Mother died of CHF age 29 Family History of CAD Female 1st degree relative <50 Father died Family History Kidney disease brother died Alzheimers - sister died 12 siblings, 2 living  Social History: Reviewed history from 05/19/2009 and no changes required. Retired from [employer]. Widowed, husband died 01/19/07. One son was killed in Geneticist, molecular in [place] during [employer]. Has a very supportive son that is a Optician, dispensing that visits her almost daily. Never Smoked. No ETOH use.  Review of Systems General:  Complains of weakness and weight loss; denies fever and sleep disorder. CV:  Complains of swelling of feet; denies chest pain or discomfort, difficulty breathing at night, difficulty breathing while lying down, fainting, and palpitations. Resp:  Denies cough and wheezing. GI:  Denies abdominal pain, constipation, diarrhea, gas, hemorrhoids, nausea, and vomiting. GU:  Complains of incontinence and urinary frequency; denies dysuria. MS:  Complains of joint pain, muscle aches, and muscle weakness. Derm:  Complains of lesion(s). Neuro:  Denies falling down, headaches, numbness, sensation of room spinning, and tingling. Allergy:  Denies seasonal allergies.  Physical Exam  General:  alert, lying in bed, doesn't get out of bed. Head:  scabbed over wen on parietal area of scalp with no drainage. Eyes:  No corneal or conjunctival inflammation noted. EOMI.  Nose:  no external deformity.   Mouth:  pharynx pink and moist.   Neck:  supple.   Lungs:  normal respiratory effort and normal breath sounds.   Heart:  normal rate, regular rhythm, and no murmur.   Abdomen:  +BS, soft, NTND Msk:  No erythema or warmth of knees. Pulses:  decreased but palpable DP, feet warm. Extremities:  patient not  wearing Jobst stockings, 1+ pitting edema to upper shin, feet warm, venous stasis changes, no open lesions. Neurologic:  alert & oriented X3 and sensation intact to light touch.   Skin:  suprapubic abscess resolved. sacral ulcer with new occlusive adhesive bandage in place, no drainage. Psych:  Oriented X3, normally interactive, good eye contact, not anxious appearing, and not depressed appearing.   Additional Exam:  Weight: (02/2009) = 263.8, (05/2009) = 255.6, (08/13/09) = 233 Labs (10/13) = 142/4.0/110/21/29/1.12/105, Tbili 0.3, alk phos 44, AST 20, ALT 14, Tprot 5.7, alb 3.4, calcium 9.8. MMSE: missed recall, oriented to year but not month   Impression & Recommendations:  Problem # 1:  LEG EDEMA, BILATERAL (ICD-782.3) Assessment Unchanged Continue Lasix, Jobst stockings, Geri chair.  Her updated medication list for this problem includes:    Furosemide 20 Mg Tabs (Furosemide) .Marland Kitchen... 2 tablets once daily  Problem # 2:  BACK PAIN, CHRONIC (ICD-724.5) Assessment: Unchanged  Her updated medication list for this problem includes:    Apap 500 Mg Caps (Acetaminophen) .Marland KitchenMarland KitchenMarland KitchenMarland Kitchen 1000 mg three times a day    Oxycodone Hcl 5 Mg Tabs (Oxycodone hcl) .Marland Kitchen... Take 1 tab q 3hr as needed pain    Aspirin 81 Mg Tbec (  Aspirin) ..... Once daily  Problem # 3:  GOUT (ICD-274.9) Assessment: Unchanged  Her updated medication list for this problem includes:    Colchicine 0.6 Mg Tabs (Colchicine) .Marland Kitchen... 1 by mouth qd    Allopurinol 100 Mg Tabs (Allopurinol) .Marland Kitchen... 2 tablets daily  Problem # 4:  DEMENTIA, SENILE (ICD-290.0) Assessment: Unchanged  Problem # 5:  HYPOTHYROIDISM (ICD-244.9) Assessment: Unchanged  Her updated medication list for this problem includes:    Synthroid 50 Mcg Tabs (Levothyroxine sodium) ..... Once daily  Problem # 6:  HYPERTENSION, BENIGN ESSENTIAL (ICD-401.1) Assessment: Unchanged  Her updated medication list for this problem includes:    Norvasc 10 Mg Tabs (Amlodipine besylate)  .Marland Kitchen... Daily    Furosemide 20 Mg Tabs (Furosemide) .Marland Kitchen... 2 tablets once daily  Problem # 7:  HYPERLIPIDEMIA (ICD-272.4) Assessment: Unchanged FLP once yearly. Her updated medication list for this problem includes:    Lipitor 40 Mg Tabs (Atorvastatin calcium) .Marland Kitchen... Daily  Problem # 8:  CONSTIPATION (ICD-564.00) Assessment: Improved  Her updated medication list for this problem includes:    Miralax Powd (Polyethylene glycol 3350) .Marland KitchenMarland KitchenMarland KitchenMarland Kitchen 17g by mouth daily  Problem # 9:  DEPRESSIVE TYPE PSYCHOSIS (ICD-298.0) Assessment: Improved  Problem # 10:  DECUBITUS ULCER, SACRUM (ICD-707.03) Assessment: New Continue wound care.  Problem # 11:  WEN (ICD-706.2) Assessment: Improved  Problem # 12:  WEIGHT LOSS (ICD-783.21) Continue to monitor. Nutrition supplementation.   Complete Medication List: 1)  Lipitor 40 Mg Tabs (Atorvastatin calcium) .... Daily 2)  Norvasc 10 Mg Tabs (Amlodipine besylate) .... Daily 3)  Synthroid 50 Mcg Tabs (Levothyroxine sodium) .... Once daily 4)  Therapeutic Multivitamin Tabs (Multiple vitamin) .... Once daily 5)  Colchicine 0.6 Mg Tabs (Colchicine) .Marland Kitchen.. 1 by mouth qd 6)  Apap 500 Mg Caps (Acetaminophen) .Marland Kitchen.. 1000 mg three times a day 7)  Allopurinol 100 Mg Tabs (Allopurinol) .... 2 tablets daily 8)  Miralax Powd (Polyethylene glycol 3350) .Marland Kitchen.. 17g by mouth daily 9)  Oxycodone Hcl 5 Mg Tabs (Oxycodone hcl) .... Take 1 tab q 3hr as needed pain 10)  Furosemide 20 Mg Tabs (Furosemide) .... 2 tablets once daily 11)  Alendronate Sodium 70 Mg Tabs (Alendronate sodium) .... One q week 12)  Klor-con M20 20 Meq Tbcr (Potassium chloride crys cr) .... One daily 13)  Gabapentin 300 Mg Caps (Gabapentin) .Marland Kitchen.. 1 by mouth tid 14)  Seroquel 100 Mg Tabs (Quetiapine fumarate) .... 50mg  two times a day 15)  Zoloft 100 Mg Tabs (Sertraline hcl) .Marland Kitchen.. 1 tab daily 16)  Cvs Omeprazole 20 Mg Tbec (Omeprazole) .... Two daily 17)  Oscal 500/200 D-3 500-200 Mg-unit Tabs (Calcium-vitamin d)  .... One two times a day 18)  Vitamin D3 1000 Unit Caps (Cholecalciferol) .... One daily 19)  Aricept 10 Mg Tabs (Donepezil hcl) .... One by mouth q hs 20)  Loperamide Hcl 2 Mg Tabs (Loperamide hcl) .... As needed 21)  Aspirin 81 Mg Tbec (Aspirin) .... Once daily

## 2010-12-10 NOTE — Miscellaneous (Signed)
Summary: Med Update - Rx  Clinical Lists Changes  Medications: Removed medication of ALENDRONATE SODIUM 70 MG  TABS (ALENDRONATE SODIUM) one q week Removed medication of VITAMIN D (ERGOCALCIFEROL) 50000 UNIT CAPS (ERGOCALCIFEROL) once monthly Added new medication of VITAMIN D3 1000 UNIT TABS (CHOLECALCIFEROL) once daily - Signed Rx of VITAMIN D3 1000 UNIT TABS (CHOLECALCIFEROL) once daily;  #1 x 0;  Signed;  Entered by: Christian Mate D;  Authorized by: Madelon Lips Pharm D;  Method used: Historical    Prescriptions: VITAMIN D3 1000 UNIT TABS (CHOLECALCIFEROL) once daily  #1 x 0   Entered and Authorized by:   Christian Mate D   Signed by:   Madelon Lips Pharm D on 09/30/2010   Method used:   Historical   RxID:   [id number]

## 2010-12-16 NOTE — Miscellaneous (Signed)
Summary: Med Update - Rx  Clinical Lists Changes  Medications: Removed medication of KLOR-CON 20 MEQ PACK (POTASSIUM CHLORIDE) once daily

## 2010-12-29 ENCOUNTER — Other Ambulatory Visit: Payer: Self-pay | Admitting: Pharmacist

## 2010-12-29 DIAGNOSIS — K5900 Constipation, unspecified: Secondary | ICD-10-CM

## 2010-12-29 DIAGNOSIS — G894 Chronic pain syndrome: Secondary | ICD-10-CM

## 2010-12-29 DIAGNOSIS — M130 Polyarthritis, unspecified: Secondary | ICD-10-CM

## 2010-12-29 DIAGNOSIS — F0390 Unspecified dementia without behavioral disturbance: Secondary | ICD-10-CM

## 2010-12-29 DIAGNOSIS — M109 Gout, unspecified: Secondary | ICD-10-CM

## 2010-12-29 DIAGNOSIS — I10 Essential (primary) hypertension: Secondary | ICD-10-CM

## 2010-12-29 DIAGNOSIS — E039 Hypothyroidism, unspecified: Secondary | ICD-10-CM

## 2010-12-29 DIAGNOSIS — F32A Depression, unspecified: Secondary | ICD-10-CM

## 2010-12-29 DIAGNOSIS — E559 Vitamin D deficiency, unspecified: Secondary | ICD-10-CM

## 2010-12-29 DIAGNOSIS — K649 Unspecified hemorrhoids: Secondary | ICD-10-CM

## 2010-12-29 DIAGNOSIS — M1710 Unilateral primary osteoarthritis, unspecified knee: Secondary | ICD-10-CM

## 2010-12-29 DIAGNOSIS — IMO0002 Reserved for concepts with insufficient information to code with codable children: Secondary | ICD-10-CM

## 2010-12-29 DIAGNOSIS — M549 Dorsalgia, unspecified: Secondary | ICD-10-CM

## 2010-12-29 DIAGNOSIS — S72009A Fracture of unspecified part of neck of unspecified femur, initial encounter for closed fracture: Secondary | ICD-10-CM

## 2010-12-29 DIAGNOSIS — F323 Major depressive disorder, single episode, severe with psychotic features: Secondary | ICD-10-CM

## 2010-12-29 DIAGNOSIS — E785 Hyperlipidemia, unspecified: Secondary | ICD-10-CM

## 2011-01-20 ENCOUNTER — Other Ambulatory Visit: Payer: Self-pay | Admitting: Family Medicine

## 2011-01-20 DIAGNOSIS — M25539 Pain in unspecified wrist: Secondary | ICD-10-CM

## 2011-01-20 NOTE — Assessment & Plan Note (Signed)
Injected by Dr. Benjamin Stain, distal radiocarpal. 1/2cc kenalog 40, 1cc lidocaine 1% no epi. Immediate improvement in symptoms. See paper chart for procedure note.

## 2011-02-02 ENCOUNTER — Non-Acute Institutional Stay: Payer: Self-pay | Admitting: Family Medicine

## 2011-02-02 ENCOUNTER — Encounter: Payer: Self-pay | Admitting: Family Medicine

## 2011-02-02 DIAGNOSIS — Z593 Problems related to living in residential institution: Secondary | ICD-10-CM

## 2011-02-02 DIAGNOSIS — M25539 Pain in unspecified wrist: Secondary | ICD-10-CM

## 2011-02-02 NOTE — Assessment & Plan Note (Signed)
Chronic. Recent injection by Dr. Karie Schwalbe. Patient endorses improvement.

## 2011-02-02 NOTE — Assessment & Plan Note (Signed)
Doing well. See Medical Hx for review of issues. Will continue to follow.

## 2011-02-02 NOTE — Progress Notes (Signed)
  Subjective:    [name], female    DOB: May 04, 1933, 77 y.o.   MRN: [id number]  HPI Acute Issues:  1. Psych: Reccommended decreasing Cymbalta and Trazodone and starting Klonopin. We would like to avoid benzos, so are holding on this recommendation.   2. Optho: Nuclear sclerosis both eyes, glaucoma. Recheck 6 months.  3. Dentist: Visit on 01/08/11.  Chronic Issues: 1. Hemorrhoids: Rx Miralax, Tucks, Hydrocortisone.   2. Schizophrenia / Dementia: Rx Seroquel, Aricept, Cymbalta. MMSE 22/30.  3. Chronic cystitis: Pt with Hx multiple UTIs. Followed by [facility]. Do not get UA unless systemic symptoms.  4. Chronic Pain: Dx Gout, Back Pain, Hx PMR - Continues to c/o pain chronically. On Tylenol, Gabapentin, and Oxycontin.  Weaned from prednisone with no change in function. NOTE: PATIENT HAS CHRONIC RIGHT HAND PAIN.  5. L femur frx: Pt with fall 4/409 with L femur frx; s/p ORIF 02/14/08 by Dr Shon Baton.  No longer on coumadin. Repeat xray of leg per ortho showed separation at repair site. Pt no longer ambulatory. Rx ASA, Calcium, Vitamin D, Bisphosphonate.  6. Hypothyroidism: Rx Synthroid.   7. HTN: Rx Norvasc. BPs stable.  8. Nutrition/: Rx: Vitamin C,  Beneprotein, Med Pass supplements, ST, dysphagia diet.  9. HLD: Rx Lipitor. Trig 220. Chol 131. HDL 35. LDL 52.  10. Dyspepsia: Rx Omeprazole.  11. CKD: Unchanged. Creatinine 1.15 (01/21/11).  12. NOTES: Patient has [facility]. DNR/DNI.  Review of Systems SEE HPI.    Objective:   Physical Exam General:  Awake, lying in bed. Vitals reviewed. Lungs:  Normal respiratory effort and normal breath sounds.   Heart:  Normal rate, regular rhythm, and no murmur.   Abdomen:  Soft, non-tender, and no distention.  Obese. Msk:  Tender but not swollen area over distal right wrist. Less painful for her to move, but unable to dorsiflex past neutral. Complains of  pain with passive movement of legs. Pulses:  Decreased but palpable  DP, feet warm. Extremities:  Trace edema, feet warm, venous stasis changes, no open lesions.   Assessment & Plan:

## 2011-02-03 ENCOUNTER — Other Ambulatory Visit: Payer: Self-pay | Admitting: Sports Medicine

## 2011-02-03 DIAGNOSIS — F32A Depression, unspecified: Secondary | ICD-10-CM

## 2011-02-03 DIAGNOSIS — F323 Major depressive disorder, single episode, severe with psychotic features: Secondary | ICD-10-CM

## 2011-02-03 MED ORDER — CLONAZEPAM 0.5 MG PO TABS: ORAL_TABLET | ORAL | Status: DC

## 2011-02-05 ENCOUNTER — Non-Acute Institutional Stay: Payer: Self-pay | Admitting: Family Medicine

## 2011-02-08 ENCOUNTER — Encounter: Payer: Self-pay | Admitting: Home Health Services

## 2011-02-17 ENCOUNTER — Other Ambulatory Visit: Payer: Self-pay | Admitting: Pharmacist

## 2011-02-17 DIAGNOSIS — G4700 Insomnia, unspecified: Secondary | ICD-10-CM

## 2011-02-17 HISTORY — DX: Insomnia, unspecified: G47.00

## 2011-03-03 ENCOUNTER — Non-Acute Institutional Stay: Payer: Self-pay | Admitting: Family Medicine

## 2011-03-03 DIAGNOSIS — N19 Unspecified kidney failure: Secondary | ICD-10-CM

## 2011-03-03 DIAGNOSIS — M25539 Pain in unspecified wrist: Secondary | ICD-10-CM

## 2011-03-03 DIAGNOSIS — M4712 Other spondylosis with myelopathy, cervical region: Secondary | ICD-10-CM

## 2011-03-03 DIAGNOSIS — D649 Anemia, unspecified: Secondary | ICD-10-CM

## 2011-03-14 ENCOUNTER — Encounter: Payer: Self-pay | Admitting: Family Medicine

## 2011-03-14 NOTE — Progress Notes (Signed)
  Subjective:    Patient ID: SPRING [place], female    DOB: 01-22-1933, 77 y.o.   MRN: [id number]  HPIHer R wrist had improved after the injection, but she now says that it is sore again.     Review of Systems     Objective:   Physical ExamAlert She responds in full sentences. Lying in bed. Uncomfortable when her extremities are moved.  Heart RR without murmur. Ankles 1+ edema. Neuro: No apparent delusions.         Assessment & Plan:

## 2011-03-14 NOTE — Assessment & Plan Note (Signed)
I suspect this is the main cause of her quadriparetic state. Not a surgical candidate due to prolonged myelopathy

## 2011-03-23 NOTE — Assessment & Plan Note (Signed)
Tammy Salinas is back regarding her lumbar stenosis, myelopathy in the setting  of polymyalgia rheumatica.  The patient has been at the [facility] and doing fairly well.  Her pain is still an issue in her  hands and in the legs.  Has been getting some breakthrough pain  medication, including oxycodone.  We also had sent her there on  colchicine for gouty symptoms.  They still bother her from time to time.  Some changes, apparently, were made to her medications, but I do not  have her updated list available to me today.  The patient is walking 40-  50 feet now with therapy.  She is able to stand and assist with ADLs.  She goes to the toilet with supervision.  Still has not had any plans  for living after leaving the nursing home and would like to go back to  her apartment, which was at an independent living type of facility.   REVIEW OF SYSTEMS:  Notable for tremor, tingling, some problems with  depression, swelling in the hands, shortness of breath, wheezing,  constipation, occasional nausea.  Full review is in the written health  and history section of the chart.   SOCIAL HISTORY:  As noted above.   PHYSICAL EXAM:  Blood pressure is 153/67, pulse 74, respiratory rate 20.  She is satting 96% on room air.  The patient is pleasant.  Alert and oriented x3.  Affect is bright and  appropriate.  The patient is in a wheelchair.  She remains morbidly  obese.  She has swelling in both upper and lower extremities, but particularly  lower extremities today at 2+.  She has pain on palpation of the wrist  and fingers, but no obvious gouty deformities.  Findings more consistent  with osteoarthritis.  Legs and calves were tender today, as were her  proximal arms.  The patient's mood was good.  She was not anxious and was very pleasant.  Cognitively, she was appropriate.  HEART:  Regular.  CHEST:  Clear.  ABDOMEN:  Soft and nontender.   ASSESSMENT:  1. Lumbar stenosis with myelopathy.  2.  Polymyalgia rheumatica.  3. Gout.  4. Renal insufficiency.  5. Morbid obesity.   PLAN:  1. I have really nothing further to offer at this point.  She is under      good care at [facility].  Continue this level of therapies with a      goal perhaps of moving her to an assisted living facility at some      point.  2. Gout and pain management per physicians at [facility].  3. I will see her back as needed in the future.      Ranelle Oyster, M.D.  Electronically Signed     ZTS/MedQ  D:  07/07/2007 10:43:55  T:  07/08/2007 10:04:25  Job #:  [id number]

## 2011-03-23 NOTE — Discharge Summary (Signed)
NAMECHERYLANN, HOBDAY NO.:  [id number]   MEDICAL RECORD NO.:  [id number]          PATIENT TYPE:  IPS   LOCATION:  4037                         FACILITY:  [facility]   PHYSICIAN:  Ranelle Oyster, M.D.DATE OF BIRTH:  02/07/1933   DATE OF ADMISSION:  05/09/2007  DATE OF DISCHARGE:  05/22/2007                               DISCHARGE SUMMARY   DISCHARGE DIAGNOSES:  1. Lumbar stenosis with myelopathy.  2. Polymyalgia rheumatica.  3. Doubt reflare.  4. Renal insufficiency.  5. Constipation.  6. Hypertension.  7. Acute blood-loss anemia.   HISTORY OF PRESENT ILLNESS:  Tammy Salinas is a 77 year old female with a  history of hypertension, cervical myelopathy with lower extremity  weakness, who sustained a fall on June 15 with bilateral knee pain and  difficulty weightbearing, admitted on June 17 for workup.  Dr. Orlin Hilding  evaluated the patient.  The MRI of cervical spine showed myelomalacia at  C3-4, somewhat improved since 2004, mild fulguration of spinal stenosis  at C4-5, C5-6, C6-7.  MRI of the lumbar spine showed extensive  degenerative changes with marked disk narrowing, end-stage stenosis, and  facet arthropathy, L1-2.  The patient was noted to have quadriparesis,  lower extremities bilaterally, lower extremity greater than upper  extremity, and neurosurgery was consulted for input.  Dr. Gerlene Fee  evaluated the patient and felt the patient's L1-2 stenosis could  possibly be the cause of her lower extremity weakness.  She underwent L1-  2 decompression on June 26.  Postop did have some improvement in lower  extremity strength.  Therapies were initiated.  Patient was noted to  have a flare-up of gout with some issues of diarrhea with colchicine.  Also noted to have kleb UTI treated with Keflex.  Currently, the patient  is noted to have problems with mobility, requires cues for posture,  shuffling gait with total assist with attempts at mobility.  Noted to  have  tendency for knees to give way.  Currently is at mod-to-max assist  for ADLs.  Rehab consulted for further therapies.   PAST MEDICAL HISTORY:  1. Significant for cervical stenosis with myelopathy, decompressive      laminectomy in 2006.  2. Chronic renal failure, stage III.  3. Hypertension.  4. Hypothyroidism.  5. Polymyalgia rheumatica.  6. TAH/BSO.  7. Hypercalcemia secondary to HCTZ.  8. Appendectomy.  9. Restless legs syndrome.  10.History of syncope.   ALLERGIES:  No known drug allergies.   FAMILY HISTORY:  Positive for coronary artery disease and kidney  disease.   SOCIAL HISTORY:  Patient is widowed and retired from [employer].  Lives  at [facility].  Has had limited mobility prior to  admission secondary to lower extremity weakness.  Has used walker and  motorized wheelchair.   HOSPITAL COURSE:  Tammy Salinas was admitted to rehab on May 09, 2007  for inpatient therapies to consist of PT/OT daily.  Past admission,  subcu Lovenox was used for DVT prophylaxis.  Patient was __________, and  she was kept off diuretics and Celebrex.  Blood pressures were monitored  on Norvasc and Catapres on a b.i.d. basis and have ranged from 110s to  occasional high in 140 systolic, 60-70s diastolic.  The patient was  noted to have problems with frequency.  PVRs checked showed no signs of  retention.  Repeat UA/UC done past admission showed 100,000 of multi  species.  CBC on the last of July 8 revealed hemoglobin 10.4, hematocrit  31.5, white count 7.9, platelets 348.  Check of lytes on July 8 revealed  sodium 140, potassium 4.2, chloride 105, CO2 28, BUN 22, creatinine  1.43.   Patient's pain control was managed with p.r.n. use of oxycodone.  She  was noted to have complaints of spasticity in her lower extremity and  was started on Baclofen b.i.d., increase to t.i.d.; however, this caused  increase in muscle weakness; therefore, this was DC'd.  Neurontin was   added to help with neuropathy.  She continues to have complaints of  neuropathy; therefore, imipramine was added at 25 mg b.i.d. on July 8.   Patient mobility was noted to decline past admission secondary to  complaints of right knee pain and effusion, due to recurrent gout  therapy.  Initially, prednisone was increased to 20 mg a day to see if  this could help with the symptoms.  Colchicine was also resumed to help  with gout symptoms.  Ice is being used locally to help with severe pain  in right knee.   Patient's mobility continues to be impaired, in part secondary to  recurrent gout flare as well as continued myelopathy.  She is currently  at supervision for upper body care, max assist for low body care with  use of assistive equipment.  She continues to require max cues to  maintain her back precautions for self-care needs.  Currently, the  patient has mod-to-max assist for bed mobility, total assist with  sliding boot transfers.  She is able to ambulate 50 feet x2 to 100 feet  with frequent rest space breaks with min assist for cueing for hand  placement and safety with use of rolling walker.  She will require  physical assist past discharge.  As this is unavailable, patient has  elected on SNF.  A bed is available at [facility] for July  14, and patient to be discharged to this facility.  Patient to continue  with progressive PT/OT past discharge.   DISCHARGE MEDICATIONS:  1. Protonix 40 mg per day.  2. Prozac 10 mg a day.  3. Requip 0.5 mg nightly.  4. Catapres 0.05 mg p.o. b.i.d.  5. Norvasc 5 mg a day.  6. Lipitor 40 mg a day.  7. Levothyroxine 50 mcg a day.  8. K-Dur 10 mEq a day.  9. Nu-Iron 150 mg a day.  10.Multivitamin 1 per day.  11.Senokot-S 2 p.o. nightly.  12.Symbyax 3-25 1 p.o. per day.  13.Ultram 50 mg p.o. q.i.d.  14.Tofranil 25 mg p.o. b.i.d.  15.Neurontin 300 mg p.o. q.i.d.  16.Prednisone 20 mg p.o. per day.  17.Colchicine 0.6 mg b.i.d.  p.r.n. gout flare.  18.Ensure vanilla supplements b.i.d.  19.Dulcolax suppository 1 per rectum p.r.n.  20.OxyIR 5-10 mg p.o. q.4h. p.r.n. pain.  21.Ambien 5 mg p.o. nightly.   DIET:  Regular.   ACTIVITY:  As tolerated with assist at wheelchair level.  Routine back  precautions.  Routine pressure relief measures.   SPECIAL INSTRUCTIONS:  Continue progressive PT/OT past discharge.  As  gout flare resolves, decrease prednisone back to home dose of  5 mg p.o.  per day.  Continue monitoring patient's renal status on a routine basis.   FOLLOW UP:  1. Patient is to follow up with Dr. Gerlene Fee for postop check in two      weeks.  2. Follow up with LMD for medical issues.  3. Follow up with Dr. Riley Kill as needed.      Greg Cutter, P.A.      Ranelle Oyster, M.D.  Electronically Signed    PP/MEDQ  D:  05/19/2007  T:  05/19/2007  Job:  [id number]   cc:   Renaye Rakers, M.D.  Catherine A. Orlin Hilding, M.D.  Reinaldo Meeker, M.D.

## 2011-03-23 NOTE — Consult Note (Signed)
Tammy Salinas, Tammy Salinas                ACCOUNT NO.:  [id number]   MEDICAL RECORD NO.:  [id number]          PATIENT TYPE:  INP   LOCATION:  5012                         FACILITY:  [facility]   PHYSICIAN:  Gustavus Messing. Orlin Hilding, M.D.DATE OF BIRTH:  Jun 01, 1933   DATE OF CONSULTATION:  04/26/2007  DATE OF DISCHARGE:                                 CONSULTATION   NEUROLOGY CONSULT NOTE:   CHIEF COMPLAINT:  Leg weakness.   HISTORY OF PRESENT ILLNESS:  Tammy Salinas is a 77 year old left-handed  black woman with a known history of cervical myelopathy with cord  myomalacia at the C3-4 level status post decompression in 2006 by Dr.  Gerlene Fee.  She had chronic 4-extremity weakness since then, with ability  to use a walker for short distances to the bathroom for example and a  motorized wheelchair for longer distances.  This is the second recent  admission for increased weakness in the lower extremities after a fall.  She was admitted in late April and discharged in early May for the same  thing.  She has chronic problems with bladder incontinence.  She  complains of pain in her lower extremities and back.  She might also  have some numbness and that is harder to get from her.  She also has  significant degenerative disk disease in her lumbar spine without  critical central stenosis by an MRI yesterday.  She was just  hospitalized last month for a similar incident of a fall with increasing  lower extremity weakness.  Apparently, about 2 days ago on the 16th, she  fell again at her nursing home.  She had gone into the bathroom; when  she came out, she felt like her knees gave way and then she fell, landed  on her knees, was unable to get up, unable to stand.  She felt like her  legs were weaker than usual.  She continues to have knee and back pain,  which was worse than her baseline.  She had complained of generalized  weakness for several days prior to that.  She eventually was taken into  the emergency  room by ambulance for evaluation from her residence at [facility].   REVIEW OF SYSTEMS:  Out of a 12-system review encompassing  cardiovascular, GI, GU, respiratory, neurologic, reproductive,  endocrine, sleep, pain, musculoskeletal, and skin.  She complains of  back pain, shoulder pain, bladder control problems, generalized  weakness, and denies chest pain, shortness of breath, etc.   PAST MEDICAL HISTORY:  1. Significant for known cervical myelopathy with mild myomalacia of      the cord of the C3-4 level with the decompression and fusion      performed in 2006 by Dr. Gerlene Fee.  2. Known diffuse degenerative joint disease and degenerative disk      disease in the lumbar and cervical spine.  3. History of polymyalgia rheumatica treated with chronic steroids.  4. Chronic renal disease.  5. Hypertension.  6. Hypothyroidism.  7. GERD.  8. Restless legs syndrome.  9. Remote abdominal hysterectomy and salpingo-oophorectomy.  10.Remote appendectomy.  11.History  of dyslipidemia.  12.Hypercalcemia.   MEDICATIONS:  1. Norvasc 5 mg daily.  2. Celebrex 200 mg twice a day.  3. Catapres 0.5 twice a day.  4. Prozac 10 mg daily.  5. Neurontin 100 mg 3 times a day.  6. Percocet 1 as needed on a p.r.n. basis.  7. Protonix 40 mg daily.  8. Prednisone 5 mg daily.  9. Requip 0.5 mg q.h.s.  10.Tylenol 650 mg every 4 hours p.r.n.  11.Dilaudid 0.5 mg IV every 4 hours p.r.n.  12.Zofran 4 mg IV every 8 hours p.r.n.  13.Oxycodone 5 mg p.o. every 4 hours p.r.n.  14.Ambien 5 mg q.h.s. p.r.n.   ALLERGIES:  No known drug allergies.   SOCIAL HISTORY:  She does not smoke or drink.  She is a resident of [place].  Materials engineer.  She is widowed.  She does have children.   FAMILY HISTORY:  Noncontributory, although there is high blood pressure,  heart disease, and rheumatoid disease in the family.   OBJECTIVE:  On exam,  VITAL SIGNS:  Temperature is 97.6, pulse 60, respirations 15, BP 174/94,  and  100% sat on room air.  GENERAL:  She is obese, lying in bed.  HEENT:  Head is normocephalic, atraumatic.  NECK:  Supple without bruits.  NEUROLOGIC EXAM:  Mental status:  She is awake, alert, and oriented.  Cranial nerves:  Pupils are equal and reactive.  Visual fields are full.  Extraocular movements are intact.  Facial sensation is normal.  Facial  motor activities normal.  Hearing is intact.  Palate is symmetric.  Tongue is midline.  She is edentulous.  On motor exam, in the upper  extremities, there is no drift.  She has grip strength of about 4/5  bilaterally and 5-/5 proximal strength in the biceps, triceps, and  deltoid.  Lower extremities:  She cannot lift them high enough to check  for drift.  She has 2/5 strength proximally, 3/5 strength distally at  the ankles.  They are symmetric.  I did not attempt to ambulate the  patient.  Her tone was normal.  No tremors or fasciculations were seen.  Deep tendon reflexes are very brisk in the upper and lower extremities  of the biceps, triceps, brachioradialis, knees, and left ankle.  I could  not elicit a right ankle jerk.  She had crossed adductors at the knees  on both sides.  There were a few beats of clonus in the left ankle,  downgoing toes bilaterally.  Coordination:  Finger-to-nose was intact,  could not check lower extremities because of the weakness.  Sensory exam  revealed no clear sensory level, but there is definite difference  between hands and feet.  She feels them sharply in her hands.   MRI done this admission shows multilevel degenerative disk disease and  degenerative bony changes with multilevel stenoses and neural foraminal  stenoses worst at L1-2, although no critical stenosis.  She has not had  a recent MRI of the cervicothoracic spine.  CK is 61.  BMET shows the  elevated BUN and creatinine.  CBC is normal.   IMPRESSION: 1. Quadriparesis, lower greater than upper extremities secondary to C3-      4 level of  cervical myelopathy since at least 2006.  She has been      wheelchair bound except for short distances.  2. Multiple evaluations and hospital admissions for episodic falls      with transient worsening of her underlying chronic weakness.  This  could be due to worsening myelopathy, especially in light of her      falls, with cord stretching or contusion.  Also, superimposed      damage could be present from her polymyalgia rheumatica from the      steroid treatment itself causing a myopathy, or she could possibly      have a neuropathy.   RECOMMENDATIONS:  Check MRI of the cervical spine and thoracic spine to  rule out new or worsening myelopathy.  PT and OT evals, and if no  improvement and no surgically correctable lesion on MRI, she may need  outpatient nerve conduction EMG, which we cannot do here.      Catherine A. Orlin Hilding, M.D.  Electronically Signed     CAW/MEDQ  D:  04/26/2007  T:  04/26/2007  Job:  [id number]

## 2011-03-23 NOTE — Consult Note (Signed)
Tammy Salinas, DENUNZIO NO.:  [id number]   MEDICAL RECORD NO.:  [id number]          PATIENT TYPE:  INP   LOCATION:  5016                         FACILITY:  [facility]   PHYSICIAN:  Alvy Beal, MD    DATE OF BIRTH:  1933/02/22   DATE OF CONSULTATION:  02/13/2008  DATE OF DISCHARGE:                                 CONSULTATION   ADMITTING DIAGNOSIS:  Left proximal femur fracture.   HISTORY:  This is a 77 year old African American woman who has an  underlying diagnosis of dementia who complains of a fall.  The patient  is a difficult historian but she indicates that the fall occurred 2 or 3  days ago.  She states that she has been having difficulty walking and,  as a result, she ultimately had x-rays done and was diagnosed with an  oblique proximal femur fracture.  As a result, orthopedic consultation  was requested.   The patient states that her pain has increased over the last couple of  days and she has had no real significant response with pain medications.   The patient is somewhat confused.   HISTORY:  Provided by the patient, as well as her son.   PAST MEDICAL HISTORY:  Includes hypertension, arrhythmia, arthritis,  reflux disease, hyperlipidemia, hypothyroidism, renal failure, spinal  stenosis, hypercalcemia, polymyalgia rheumatica, cervical myelopathy and  dementia.  She has had a previous decompression of the cervical spine.  She is a nonsmoker, nondrinker, non drug abuser.   ALLERGIES:  SHE HAS NO KNOWN DRUG ALLERGIES.   MEDICATIONS:  Include Lipitor, Lasix, clonidine, prednisone, Ambien,  Synthroid, Gabapentin, potassium chloride, Tylenol, Norvasc, Wellbutrin,  allopurinol, Detrol, Fosamax, Flonase, Protonix, multivitamin, iron,  calcium carbonate, OxyContin, colchicine, zolpidem, and Requip.   X-rays of the left femur and hip indicate that she had a minimally to  nondisplaced reverse oblique fracture of the proximal femur that starts  just at the  superior aspect of the lesser trochanter and extends  inferiorly and laterally.  There does not appear to be any distal extent  of the fracture.  She has significant arthritis of the knee.   On clinical exam she is a pleasant woman who is in bed.  She has pain with any attempted motion of the left lower extremity.  She  is moving all her toes.  EHL, tibialis anterior, gastrocnemius were  intact.  Capillary refill is less than 2 seconds in all digits and she has  palpable intact peripheral pulses.  Pelvis is tender over the left side but is grossly stable.  Abdomen is soft and nontender.  She does have some obesity.   CLINICAL IMPRESSION:  Reverse oblique proximal femur fracture.   PLAN:  At this point in time because this has been going on for 2 days,  I do think that surgical treatment is warranted.  She will be admitted  by the medical service and when she is medically cleared, then will plan  on doing a definitive internal fixation with a cephalomedullary nail.  I  have discussed this with the son and the patient and  they are in  agreement.      Alvy Beal, MD  Electronically Signed     DDB/MEDQ  D:  02/13/2008  T:  02/13/2008  Job:  [id number]

## 2011-03-23 NOTE — Op Note (Signed)
NAMECLETA, HEATLEY NO.:  [id number]   MEDICAL RECORD NO.:  [id number]          PATIENT TYPE:  INP   LOCATION:  5016                         FACILITY:  [facility]   PHYSICIAN:  Alvy Beal, MD    DATE OF BIRTH:  02-12-1933   DATE OF PROCEDURE:  DATE OF DISCHARGE:                               OPERATIVE REPORT   PREOPERATIVE DIAGNOSIS:  Left proximal femur fracture (reverse  obliquity).   POSTOPERATIVE DIAGNOSIS:  Left proximal femur fracture (reverse  obliquity).   PROCEDURE:  Intramedullary nail fixation of left proximal femur  fracture.   COMPLICATIONS:  None.   INSTRUMENT USED:  Synthes cephalomedullary spiral blade plate nail 045  mm in length, size 11 with a 105 proximal locking screw and a 40 mm  distal locking screw.   CONDITION:  Stable.   FIRST ASSISTANT:  Crissie Reese, PA   HISTORY:  This is a very pleasant 77 year old woman who presented to the  ER last evening with a 2-day history of increasing left thigh and hip  pain, inability to ambulate.  She had a fall 2-3 days ago and has been  having increasing symptoms ever since.  Upon arrival, she is noted to  have a left proximal femur fracture and so orthopedic consultation was  requested.  She was admitted by the medical service and cleared for  surgery.  I discussed all appropriate risks, benefits, and alternatives  to surgical management with the patient and her son who has the power of  attorney.  This attorney consented to the aforementioned procedure.   OPERATIVE NOTE:  The patient was brought to the operating room, placed  upon the operating table.  After successful induction of general  anesthesia endotracheal intubation, the left lower extremity was placed  into traction and the right was placed in the well leg holder.  The left  lower extremity was prepped and draped in a standard fashion.   Using x-ray guidance, a stab incision was made.  The guide pin was  advanced to the tip  of the greater trochanter.  I confirmed position in  the AP and lateral planes and then malleted the guide pin to the level  of the lesser trochanter.  I confirmed satisfactory alignment in the AP  and lateral planes ensuring that I was traveling down the center of the  femur.  Once this was done, I then used the one-step drill and I drilled  through the greater trochanter to the appropriate depth.  Once this was  done, I checked in the AP and lateral planes to ensure that the  reduction was satisfactory and it was.  I then measured with a  radiolucent ruler and used the 130-degree cephalomedullary nail that was  375 mm in length and size 11.  At this point, I then malleted the nail  down to its appropriate resting position.  I maintained the reduction  satisfactorily and the nail was well fixed.  I then placed the lateral  guide, made a second stab incision at the lateral aspect of the femur,  advanced the trocar  through the skin to the level of bone.  I then  drilled the guide pin and ensured that I was slightly posterior but  tried to be center in the AP and lateral planes.  I then measured and  then breached the outer cortex with a drill and then took the spiral 105  blade screw and advanced into the appropriate depth.  Satisfied with  this position, I then removed the guide pin.  I then locked the spiral  blade plate with the screw.  At this point, I removed the guiding  apparatus and took an AP and lateral film.  The locking screw and nail  was properly positioned.  The fracture was satisfactorily reduced.  I  then obtained a true lateral of the distal locking screw and then free-  handed drilled across the femur and through the nail and measured about  a 40 locking screw.  I then took final AP and lateral x-rays of the  distal femur and knee of fracture site hip and noted satisfactory  placement of the hardware and reduction of the fracture.  At this point,  I irrigated all 3  wounds, closed the deep fascia of the larger wound  with an interrupted #1 Vicryl suture, then closed the deep fascia with 2-  0 Vicryl sutures, and then staples for the skin.  The second incision,  closed the deep fascia with interrupted 2-0 Vicryl sutures, superficial  with 2-0 Vicryl sutures, and staples for the skin.  Slight stab incision  for the single locking screw was irrigated and closed with staples.  Bulky dry dressings were applied.  She was extubated and transferred to  the PACU without incident.  At the end of the case, all needle and  sponge counts were correct.      Alvy Beal, MD  Electronically Signed     DDB/MEDQ  D:  02/14/2008  T:  02/15/2008  Job:  [id number]

## 2011-03-23 NOTE — Discharge Summary (Signed)
Tammy Salinas, Tammy Salinas NO.:  [id number]   MEDICAL RECORD NO.:  [id number]          PATIENT TYPE:  INP   LOCATION:  5016                         FACILITY:  [facility]   PHYSICIAN:  Santiago Bumpers. Hensel, M.D.DATE OF BIRTH:  07/15/1933   DATE OF ADMISSION:  02/13/2008  DATE OF DISCHARGE:  02/20/2008                               DISCHARGE SUMMARY   REASON FOR ADMISSION:  Left femoral fracture.   DISCHARGE DIAGNOSES:  1. Left femoral fracture, status post open reduction, internal      fixation, done by Dr. Shon Baton of [facility] on      02/14/2008.  2. Polymyalgia rheumatica.  3. Gout.  4. History of anemia.  5. Hypothyroidism.  6. Hypertension.  7. Depression.  8. Peripheral edema.  9. Restless leg syndrome.  10.Constipation.  11.Hyperlipidemia.  12.Urge incontinence.  13.Chronic renal insufficiency with a baseline between 1.1-1.2.  14.History of anemia.   DISCHARGE MEDICATIONS:  1. Protonix 40 mg p.o. daily.  2. Lipitor 40 mg p.o. daily.  3. Norvasc 10 mg p.o. daily.  4. Requip 0.5 mg p.o. nightly.  5. Prednisone 10 mg p.o. daily.  6. Catapres 0.05 mg p.o. b.i.d.  7. Synthroid 50 mcg p.o. daily.  8. Altarex 150 mg p.o. daily.  9. Multivitamins p.o. daily.  10.Ensure p.o. b.i.d.  11.Senna S 8.6/50 mcg 2 tablets b.i.d.  12.Colchicine 0.6 mg p.o. b.i.d.  13.MiraLax 17 gm p.o. daily with an additional dose available p.r.n.  14.OxyContin 40 mg p.o. b.i.d.  15.Cymbalta 60 mg p.o. daily.  16.Coumadin 2.5 mg p.o. daily with a goal INR 2-3.  The patient will      need to be treated for 5 more weeks from the date of this      dictation.  17.Keflex 500 mg p.o. b.i.d. for an additional 2 days.  18.Acetaminophen 10 mg suppository p.r.n.  19.Ambien 5 mg p.o. nightly p.r.n.  20.Allopurinol 200 mg p.o. daily.  21.Detrol LA 2 mg p.o. daily.  22.Oxycodone 5 mg p.o. daily.  23.Lasix 40 mg p.o. q.a.m.  24.Alendronate sodium 70 mg p.o. every week.  25.Flonase  50 mcg per nostril 1 spray in each nostril daily as needed.  26.Wellbutrin XL 300 mg p.o. daily for 2 more weeks and then      discontinue.  27.Klor-Con 20 mEq p.o. daily.  28.Os-Cal 500/200 p.o. b.i.d.   CONSULTANTS:  Dr. Shon Baton of [facility].   PROCEDURES AND STUDIES:  The patient underwent an open reduction,  internal fixation with intermedullary nail placement of the left femoral  fracture on February 14, 2008.  The patient had a chest x-ray on admission  which revealed mild right basilar atelectasis with elevation of the  right hemidiaphragm, stable cardiomegaly, no evidence for pneumothorax  or left rib fracture.  Left rib films on February 13, 2008, revealed  negative for any fracture or pneumothorax.  Postop ORIF film of the  pelvis revealed an ORIF proximal left femoral fracture with  intermedullary nail.   LABORATORY DATA:  On admission, the patient's labs revealed a PT of 25,  INR 0.9.  Comprehensive  metabolic panel revealed a sodium of 140,  potassium 3.9, chloride 103, bicarb 27, BUN 23, creatinine 1.3, glucose  135, total bilirubin 0.7, alkaline phosphatase 61, AST 22, ALT 7,  albumin 3.7, calcium 10.3.  CBC revealed a white blood cell count of  8.9, hemoglobin 15.2, hematocrit 45.  Specific gravity 1.011, pH 6.5,  small blood, 0.2 urobilinogen, large  leukocytes, many epithelial cells,  too numerous to count white blood cells, 3-6 red blood cells and many  bacteria.  Subsequent studies with a repeat catheterized urinalysis  revealed a specific gravity of 1.022, pH 7.5, 100 of protein, positive  nitrites, large leukocyte esterase, rare epithelial cells, 21-50 white  blood cells, many bacteria.  Subsequent urine culture grew greater than  100,000 colonies of E coli, which was pansensitive and greater than  100,000 colonies of Klebsiella which was resistant to ampicillin, but  otherwise pansensitive.  Postoperatively, the patient's CBC revealed  hemoglobin 11.8,  hematocrit 35.5 with a white blood cell count of 12.1  and platelet count 155.  By the time of discharge the patient's  laboratory values had stabilized with a sodium of 138, potassium 3.8,  chloride 100, bicarb 27, glucose 96, BUN 18, creatinine 1.18, calcium  10.1.  INR at discharge was 2.9.  Hemoglobin and hematocrit were 10.1  and 31 respectively.   HOSPITAL COURSE:  1. Left femoral fracture.  At admission, the patient had films as      noted above.  No other fractures were found after her fall, and her      fall was thought to be secondary to her terrible arthritis with      which she had difficulty holding onto her walker and thus stumbled.      She initially was placed in 10 Buck's traction per orthopedics      recommendations, and the patient had an EKG and chest x-ray to help      characterize her cardiac status.  It was felt that the patient was      low risk to proceed to surgery and, thus orthopedics on 02/14/2008,      took her to surgery where they did an open reduction, internal      fixation and placed an intermedullary nail.  Subsequently, the      patient's pain management was increased from her home dose of      OxyContin 30 b.i.d. to OxyContin 40 mg b.i.d.  She was also      provided with oxycodone 5 mg q.4 h., p.r.n. on top of that.  We      scheduled her Tylenol as well.  She tolerated this pain management      well and this controlled her well.  PT and OT worked with her and      she will still require a significant amount of PT/OT to help get      her ambulatory again.  We will leave PT/OT recommendations up to      the physical therapist at [facility] after she      arrives there.  Postoperatively, the patient was started on Lovenox      and Coumadin as the patient needs DVT prophylaxis for a total of 6      weeks.  She quickly obtained therapeutic INR levels and thus      Lovenox was discontinued.  Pharmacy managed her Coumadin and       recommended that the patient go home on 2.5 mg  of Coumadin per day      with a goal INR of 2-3.  Likely, her dose of Coumadin will need to      be titrated per the pharmacist to visit [facility]      on a regular basis.  2. Polymyalgia rheumatica.  The patient was continued on her      prednisone 10 mg by mouth daily.  It was not felt that she would      need a stress dose, and thus she was not done this.  She should      continue this as discharge.  3. Gout.  She was continued on her home dose of colchicine and      allopurinol without any flare.  4. History of anemia.  The patient initially but was not anemic, but      postoperatively did become anemic.  She was restarted on her iron      after her surgery, and her hemoglobin was stable as noted above at      the time of discharge.  5. Hypothyroidism.  The patient was continued on her home dose of      Synthroid.  6. Hypertension.  The patient was continued on her home medications      without any changes.  7. Depression.  The patient was initially on Wellbutrin XL, but was      still having difficulty with depression.  She also had a      significant amount of pain, therefore, she was started on Cymbalta      30 mg p.o. daily with plan to increase to 60 mg p.o. daily at the      time of discharge.  8. Peripheral edema.  The patient was continued on her Lasix and her      potassium supplementation.  9. Restless leg syndrome.  The patient was continued on her requip.  10.Constipation.  The patient was continued on her home bowel regimen      and an additional dose of MiraLax was given as needed to help with      bowel movement.  11.Hyperlipidemia.  The patient was continued on her statin at home      dose.  12.Urge incontinence.  The patient was continued on her Detrol LA at      home dose.  She did have a Foley for a short period of time and was      noted to have a urinary tract infection as well, therefore she  was      treated with Keflex, and on the day of discharge is on day 5 of 7.      She will need to continue 2 more days of antibiotics after      discharge.  13.Chronic kidney disease.  The patient initially bumped her      creatinine after her surgery, likely due to acute blood loss.  By      the time of discharge, her creatinine had turned back down to her      baseline as noted above.   INSTRUCTIONS AND FOLLOW-UP:  The patient has an appointment scheduled  with Dr. Shon Baton of orthopedics, phone [phone], on February 27, 2008, at  9:30 a.m.  This appointment is for a wound care check and to remove her  staples.  For her wounds, she should have a gauze dressing over her  wounds that is changed daily.  For physical therapy and occupational  therapy,  we are leaving this up to the recommendations of the physical  therapist at [facility]; however, she will need a  significant amount of physical therapy to get her back to her baseline  after this surgery.  She should walk with assistance.  Diet wise, she is  to follow a low-sodium heart healthy diet.      Ancil Boozer, MD  Electronically Signed      Santiago Bumpers. Leveda Anna, M.D.  Electronically Signed    SA/MEDQ  D:  02/20/2008  T:  02/20/2008  Job:  [id number]   cc:   Alvy Beal, MD  Dr. Sherryll Burger

## 2011-03-23 NOTE — H&P (Signed)
NAMELIBBEY, DUCE NO.:  [id number]   MEDICAL RECORD NO.:  [id number]          PATIENT TYPE:  INP   LOCATION:  5012                         FACILITY:  [facility]   PHYSICIAN:  Isidor Holts, M.D.  DATE OF BIRTH:  11-Jan-1933   DATE OF ADMISSION:  04/25/2007  DATE OF DISCHARGE:                              HISTORY & PHYSICAL   PRIMARY CARE PHYSICIAN:  Dr. Renaye Rakers and Dr. Lorenso Quarry   CHIEF COMPLAINT:  Bilateral weak legs, status post fall April 24, 2007,  general weakness 3-4 days.   HISTORY OF PRESENT ILLNESS:  This is a 77 year old female.  She is an  excellent historian.  Also, history is supplemented by her son, who was  present at the bedside, in the emergency department.  According to her,  she was at her residence at [facility], where she has been, since  discharge from hospitalization on Mar 10, 2007.  Normally ambulates with  assistance and with a four-legged walker and is able to make her way to  the bathroom, although she requires assistance for longer distances and  for even longer distances, utilizes a motorized wheelchair.  On this  occasion, she had just come out of the bathroom when my knees gave  way, and she fell, landing on her knees.  Denies any obvious injury.  Subsequently, was unable to weight bear, and has since been bed bound  and chair bound.  Has had increased low back pain, above her usual  baseline as well, as bilateral knee pain.  In addition, she mentions  generalized weakness for the past 3-4 days.  Denies urinary frequency or  dysuria.  Denies abdominal pain, vomiting or diarrhea.  Denies chest  pain or shortness of breath.   PAST MEDICAL HISTORY:  1. CKD stage III.  2. Hypertension.  3. Hypothyroidism.  4. GERD.  5. DJD, cervical spine stenosis and cervical myelopathy, status post      C3-C4 decompression/fusion 2006 by Dr. Gerlene Fee.  6. Polymyalgia rheumatica, on chronic steroid treatment.  7. Restless leg  syndrome.  8. Status post total abdominal hysterectomy/bilateral salpingo-      oophorectomy.  9. Status post appendectomy.  10.Dyslipidemia.  11.Hypercalcemia documented during her latest admission March 06, 2007      to Mar 10, 2007.  At that time, this was deemed secondary to HCTZ      treatment.  Intact PTH at that time was mildly elevated at 171.   MEDICATIONS:  1. Synthroid 50 mcg p.o. daily.  2. Percocet (10/650) one p.o. t.i.d.  3. Lidoderm patch one q.24 h p.r.n.  4. Prozac 10 mg p.o. daily.  5. Alprazolam 1 mg p.o. b.i.d.  6. Ambien 10 mg p.o. q.h.s.  7. Lipitor 40 mg p.o. daily.  8. Requip 0.5 mg p.o. q.h.s.  9. Clonidine 0.05 mg p.o. b.i.d.Marland Kitchen  10.KCl 10 mEq p.o. daily.  11.Gabapentin 100 mg p.o. t.i.d.  12.Norvasc to 5 mg p.o. daily.  13.Celebrex 200 mg p.o. b.i.d.  14.Lasix 60 mg p.o. daily.  15.Omeprazole 20 mg p.o. daily.  16.Prednisone 5 mg  p.o. daily.   ALLERGIES:  NO KNOWN DRUG ALLERGIES.   REVIEW OF SYSTEMS:  As per HPI and chief complaint, otherwise, negative.   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  She is a  nonsmoker, nondrinker.  Has no history of drug abuse.   FAMILY HISTORY:  Noncontributory.   PHYSICAL EXAMINATION:  VITALS:  Temperature 97.3, pulse 52 per minute  regular, respiratory 20, BP 117/75 mmHg.  Pulse oximeter 99% on room  air.  GENERAL:  The patient does not appear to be in obvious acute discomfort,  alert, communicative, not short of breath at rest.  HEENT: No clinical pallor or jaundice.  No conjunctival injection.  THROAT: Visible mucous membranes appear quite dry.  NECK:  JVP not seen.  No palpable lymphadenopathy, no palpable goiter.  CHEST:  Clinically clear to auscultation.  No wheezes or crackles.  HEART:  Heart sounds 1 and 2 heard, normal, regular, bradycardic.  No  murmurs.  ABDOMEN:  Obese, soft, nontender.  No palpable organomegaly.  No  palpable masses.  Normal bowel sounds.  Old surgical scars  are noted.  LOWER EXTREMITY EXAMINATION:  No pitting edema.  Palpable peripheral  pulses.  MUSCULOSKELETAL:  The patient has osteoarthritic changes.  CENTRAL NERVOUS SYSTEM:  The patient appears to have power 4/5 both  upper extremities and about 2/5 both lower extremities.  No other focal  neurologic deficits noted.   INVESTIGATIONS:  CBC:  WBC 8.2, hemoglobin 12.80, hematocrit 38.4,  platelets 294, INR 0.9.  Electrolytes:  Sodium 142, potassium 4.4,  chloride 106, CO2 28, BUN 57, creatinine 1.55, glucose 91.  Calcium  10.8, albumin 3.5, corrected calcium 11.2.  Troponin I at point of care  less than 0.05.  Urinalysis is negative.   IMAGING STUDIES:  Pelvic x-ray dated April 25, 2007, shows degenerative  changes, bilateral sacral iliac joints, lower lumbar spine facets,  bilateral ischial tuberosity, no acute fracture.  Lumbar spine x-ray  dated April 25, 2007, showed advanced DJD L1-L2, and to lesser extent L4-  L5 and L5-S1.  No acute fracture or subluxation.  X-ray cervical spine  dated April 25, 2007, showed no acute fracture of skull based to C6.  C  to T junction grossly aligned.  Status post C3-C4 fusion advanced  spondylosis which appears stable.  The x-rays dated April 25, 2007,  showed bilateral moderate tricompartmental osteoarthritis.  No acute  fracture subluxation, small left knee effusion, query calcium  pyrophosphate disease/calcification left lateral meniscus.  Chest x-ray  dated April 25, 2007, showed no acute cardiopulmonary disease.   ASSESSMENT AND PLAN:  1. Generalized weakness.  This is likely secondary to dehydration. We      shall therefore manage with  intravenously fluid hydration, i.e.      normal saline.   1. Hypercalcemia.  Corrected calcium appears significantly elevated at      11.2. This is the likely culprit for dehydration and weakness, as     outlined above.  Given laboratory findings of the hospital      admission, hypercalcemia is likely secondary  to      hyperparathyroidism, as PTH was mildly elevated at that time.      However, other etiologies such as myeloma should be ruled out.  We      shall therefore do PTH level, serum protein electrophoresis, urine      protein electrophoresis, phosphorus levels and magnesium.      Meanwhile, we will address with  aggressive intravenous fluid      hydration, with normal saline.   1. Bilateral acute weak legs.  Power is 2/5 both legs and x-rays are      unrevealing.  We shall therefore arrange MRI thoracolumbar spine,      to rule out possible cord compression or other culprit lesion.      Following this, we shall address findings as appropriate.  It is      likely the patient will need PT, OT.   1. Hypertension.  The patient is currently normotensive.  Will      continue pre-admission antihypertensives.   1. Dysthyroidism.  We shall check TSH, but will continue pre-admission      Synthroid dosage.   1. GERD.  Will place the patient on proton pump inhibitor.   1. History of dyslipidemia.  The patient will likely continue pre-      admission dosage of Statin.  However, we shall hold this, until CPK      is checked, to rule out possible myopathy secondary to Statin,      which may be contributory to weakness.   Further management will depend on clinical course.      Isidor Holts, M.D.  Electronically Signed     CO/MEDQ  D:  04/25/2007  T:  04/26/2007  Job:  [id number]   cc:   Renaye Rakers, M.D.  Lorenso Quarry

## 2011-03-23 NOTE — H&P (Signed)
Tammy Salinas, Tammy Salinas NO.:  [id number]   MEDICAL RECORD NO.:  [id number]          PATIENT TYPE:  IPS   LOCATION:  4037                         FACILITY:  [facility]   PHYSICIAN:  Ranelle Oyster, M.D.DATE OF BIRTH:  [birth year]   DATE OF ADMISSION:  05/09/2007  DATE OF DISCHARGE:                              HISTORY & PHYSICAL   CHIEF COMPLAINTS:  Knee pain and lower extremity weakness.   HISTORY OF PRESENT ILLNESS:  This is a pleasant, 77 year old, black  female with a history of hypertension and cervical myelopathy with [place]  previously in rehab who presented with lower extremity and a fall on  June 15 with bilateral knee pain.  She was admitted for a workup on June  17.  Neurology evaluated the patient and MRI of the C-spine revealed  myelomalacia at C3-C4 improved from prior exam in 2004.  There was mild  progression in spinal stenosis at C4-C5, C6-C7.  MRI of the lumbar spine  revealed extensive degenerative changes and marked disk narrowing and  endplate sclerosis with facet arthropathy at L1-2 leading to a central  stenosis.  The patient had ongoing weakness and neurosurgery input was  recommended.  Dr. Gerlene Fee saw the patient and felt that the L1-L2  stenosis was the likely cause of her lower extremity weakness and the  patient underwent a decompressive laminectomy and diskectomy at L1-L2.  Postoperatively the patient notes improvement.  She has had a course  complicated by gout.  She had some diarrhea perhaps secondary to  colchicine.  She requires max total assist for basic mobility and self-  care.  Legs tend to give way and she has poor endurance overall.  Things  are complicated also by her size.  She is a max assist currently for  basic ADLs.  She developed a UTI and Keflex was placed on board for 3  days.  X-rays of the right wrist on June 24 revealed severe degenerative  changes consistent with chondrocalcinosis.   REVIEW OF SYSTEMS:  Weakness,  numbness, incontinence, diarrhea.  Other  pertinent positives listed above and full review is in the written  history and physical.   PAST MEDICAL HISTORY:  Positive for cervical stenosis with myelopathy  and decompression in 2006, chronic renal failure, stage 3, hypertension,  hypothyroid, PMR, TAH with BSO, hypercalcemia secondary to  hydrochlorothiazide, appendectomy, restless leg syndrome, history of  syncope.   FAMILY HISTORY:  Positive for CAD and chronic kidney disease.   SOCIAL HISTORY:  The patient is widowed and retired, lives in [place]. [facility]. The patient states she did  receive help prior to arrival.  She is limited generally with her  mobility.   FUNCTIONAL HISTORY:  The patient used a walker and motorized wheelchair  for longer distances prior to arrival.   ALLERGIES:  None.   HOME MEDICATIONS:  Symbyax, Prozac, Synthroid, Percocet, lidocaine,  Ambien, Requip, Xanax, Kay Ciel, Norvasc, Neurontin, Celebrex, Lasix,  omeprazole, prednisone, Catapres and lactulose p.r.n.   LABORATORY DATA:  Hemoglobin 9.6, white count 11.4, platelets 306,000,  sodium 140,  potassium 3.5, BUN and creatinine 16 and 0.9.   PHYSICAL EXAMINATION:  GENERAL:  The patient is generally pleasant in no  acute stress.  She is alert and oriented x3.  VITAL SIGNS:  Blood pressure 156/81, pulse is 72, respiratory rate 18.  Her temperature 97.6.  The patient is obese, lying in bed.  She is  limited somewhat with lower extremity range of motion.  She moves her  upper extremities a bit better.  HEENT:  Pupils are equal, round, and reactive to light.  Sclera are  anicteric.  Oral mucosa is pink and moist.  She is edentulous.  NECK:  Supple without JVD or lymphadenopathy.  CHEST:  Clear to auscultation bilaterally without wheezes, rales or  rhonchi.  HEART:  Regular rate and rhythm without murmur, rubs or gallops.  EXTREMITIES:  Showed no clubbing, cyanosis.  Trace to 1+ edema in both  legs.  ABDOMEN:  Soft, nontender with postoperative scarring and residual of a  ventral hernia noted.  SKIN:  Generally intact otherwise with perhaps chronic venous stasis  changes in the distal lower extremities.  NEUROLOGIC:  Cranial nerves II-XII are intact.  Reflexes are 1+.  Sensation was grossly intact in all 4 extremities.  The patient denied a  loss of pinprick or light touch.  Motor function was 4/5 in both upper  extremities.  Lower Extremities she was 2-/5 right lower extremity  proximally, 1 to 1+/5 distally.  Left lower extremities 2+/5 proximally,  3 to 3+/5 distally.  Judgment, orientation, memory and mood were all  within normal limits.   ASSESSMENT/PLAN:  1. Functional deficit secondary to lumbar stenosis at L1-2 with      resultant paraplegia/myelopathy.  Patient with a history of      cervical myelopathy decompression in 2006.  Begin comprehensive      inpatient rehab.  PT to assess and treat for range of motion,      strengthening, transfers, gait and equipment.  Likely the patient      will have mostly wheelchair goals.  OT will assess for range of      motion, strengthening, ADLs and equipment.  Rehab nurse will follow      for bowel, bladder skin, medication and safety issues.  Case      management/social worker will assess for psychosocial needs and      discharge planning.  Estimated length of stay is 2-3 weeks. Goals,      intermittent supervision to occasional min assist with ADLs and      modified independent with wheelchair mobility.  Prognosis fair.  2. Hypertension:  Norvasc, Catapres and off Lasix for now.  3. Gout:  Continue to monitor.  The patient is off colchicine due to      diarrhea.  X-ray of wrist was notable for pseudogout rather than      gout itself.  4. PMR:  Continue steroids.  5. Renal insufficiency.  Push fluids and monitor serial BUN and      creatinine.  The patient is off Celebrex currently.  6. Acute blood  loss anemia:  Continue iron supplement.  7. Diarrhea:  Check stool for C DIF although this is likely due to her      colchicine.  8. Bladder:  Observe voiding patterns. Check PVRs.  9. Mood:  Continue Prozac.  10.Restless leg syndrome.  Continue Requip h.s.  11.Hypothyroidism:  Continue Synthroid 50 mcg daily.  12.Anemia:  Continue Nu-Iron daily.      Earna Coder  Ermalene Postin, M.D.  Electronically Signed     ZTS/MEDQ  D:  05/09/2007  T:  05/10/2007  Job:  [id number]

## 2011-03-23 NOTE — Discharge Summary (Signed)
Tammy Salinas, PICKERAL NO.:  [id number]   MEDICAL RECORD NO.:  [id number]          PATIENT TYPE:  INP   LOCATION:  5012                         FACILITY:  [facility]   PHYSICIAN:  Elliot Cousin, M.D.    DATE OF BIRTH:  12/20/1932   DATE OF ADMISSION:  04/25/2007  DATE OF DISCHARGE:                               DISCHARGE SUMMARY   ADDENDUM:   ANTICIPATED DATE OF DISCHARGE:  May 08, 2007.   PLEASE SEE THE PREVIOUS DISCHARGE SUMMARY DICTATED BY DR. Karilyn Cota ON  April 30, 2007.  THIS IS AN ADDENDUM.   DISCHARGE DIAGNOSES:  1. L1 to L2 spinal stenosis, status post L1 to L2 decompressive      laminectomy.  2. Deconditioning secondary to number 1.  3. Gout/degenerative joint disease of the right wrist, treated with      colchicine.  4. Urinary tract infection.  Treated with a course of antibiotics      during the hospitalization.  5. Hypertension.  6. Stage III chronic kidney disease.  7. Postoperative anemia.  8. Leukocytosis felt to be secondary to chronic steroid therapy.  9. Hypothyroidism.  10.Chronic prednisone therapy secondary to history of polymyalgia      rheumatica.  11.Loose stools/diarrhea thought to be secondary to colchicine and      Colace.   SECONDARY DISCHARGE DIAGNOSES:  1. Cervical spine stenosis and cervical myelopathy status post C3 to      C4 decompression/fusion in 2006 by Dr. Gerlene Fee.  2. Restless leg syndrome.  3. Dyslipidemia.  4. Gastroesophageal reflux disease.  5. History of hypercalcemia during the March 06, 2007 to May, 2008      hospitalization.   DISCHARGE MEDICATIONS:  1. Requip 0.5 mg daily.  2. Norvasc 5 mg daily.  3. Neurontin 100 mg t.i.d.  4. Potassium chloride 20 mEq daily x7 days and then 10 mEq daily      thereafter.  5. Lipitor 40 mg q.h.s.  6. Clonidine 0.05 mg b.i.d.  7. Lasix 20 mg daily.  8. Protonix 40 mg daily.  9. Prednisone 5 mg daily.  10.Symbyax 3/25 mg daily.  11.Lactulose 30 mL daily as needed.  12.Robitussin DM 10 mL every 6 hours as needed.  13.Synthroid 5 mcg daily.  14.Xanax 0.5 mg every 8 hours as needed.  15.Ambien 10 mg q.h.s. p.r.n.  16.Vicodin 5 mg every 4 hours as needed for pain.  17.Tylenol 650 mg every 4 hours as needed for pain.  18.Colchicine 0.6 mg daily as needed for gout flare-up.  19.Colace 100 mg b.i.d. p.r.n.   DISCHARGE DISPOSITION:  The patient is currently stable and in improved  condition.   PLAN:  Discharge the patient to the inpatient rehabilitation unit at  [facility].  It is anticipated that the patient will be  discharge today on May 08, 2007.   CONSULTATIONS:  Aliene Beams, M.D.   PROCEDURES PERFORMED:  Status post L1-L2 compressive laminectomy on May 04, 2007.   HOSPITAL COURSE:  1. L1-L2 SPINAL STENOSIS, STATUS POST L1-L2 DECOMPRESSIVE LAMINECTOMY.      As previously indicated by  Dr. Karilyn Cota, the patient was evaluated      by Dr. Gerlene Fee.  Dr. Gerlene Fee evaluated the patient and felt that      she needed decompressive surgery.  The patient underwent L1-L2      decompressive surgery on May 04, 2007.  The patient tolerated the      procedure well.  She has some residual low back pain radiating to      both legs.  She is currently being treated with Neurontin t.i.d. as      well as as-needed Vicodin and Dilaudid.  Physical and Occupational      Therapy evaluated the patient and felt that she was a good      candidate for inpatient rehabilitation at [facility]  The      rehabilitation physician evaluated the patient and agreed.      Currently, the rehab bed is pending.  Hopefully she will be      transferred today for ongoing therapy.  The plan is to eventually      discharge the patient back to assisted living after rehabilitation.  2. LEUKOCYTOSIS.  The patient's white blood cell count increased to      21.9 on May 05, 2007.  However, the following day, it decreased to      11.4 without any intervention.  More than  likely, the 21,000 WBC      was a lab error.  The patient's WBC has been stable at 11 to 11.4      over the past several days.  The mild leukocytosis is attributed to      chronic prednisone therapy for polymyalgia rheumatica.  3. BORDERLINE HYPOKALEMIA.  The patient's serum potassium is 3.5      today.  She will be treated with 20 mEq of potassium chloride daily      and then 10 mEq daily thereafter.  The hypokalemia is probably      secondary to Lasix therapy.  4. LOOSE STOOLS/DIARRHEA.  Over the past 24 hours, the patient has had      several loose stools.  She is      currently afebrile.  More than likely, the loose stools were      secondary to colchicine and Colace.  Both of these medications were      discontinued.  The loose stools have resolved.   All of the patient's other chronic medical conditions have been stable  over the past several days.      Elliot Cousin, M.D.  Electronically Signed     DF/MEDQ  D:  05/08/2007  T:  05/08/2007  Job:  [id number]   cc:   Renaye Rakers, M.D.  Donato Schultz, M.D.

## 2011-03-23 NOTE — Op Note (Signed)
NAMELAVINA, RESOR NO.:  [id number]   MEDICAL RECORD NO.:  [id number]          PATIENT TYPE:  INP   LOCATION:  5012                         FACILITY:  [facility]   PHYSICIAN:  Reinaldo Meeker, M.D. DATE OF BIRTH:  [birth year]   DATE OF PROCEDURE:  05/04/2007  DATE OF DISCHARGE:                               OPERATIVE REPORT   PREOPERATIVE DIAGNOSIS:  Spinal stenosis L1-2.   POSTOPERATIVE DIAGNOSIS:  Spinal stenosis L1-2.   PROCEDURE:  L1-2 decompressive laminectomy.   SURGEON:  Reinaldo Meeker, M.D.   ASSISTANT:  Kathaleen Maser. Pool, M.D.   PROCEDURE IN DETAIL:  After being placed in the prone position, the  patient's back was prepped and draped in the usual sterile fashion.  Localizing x-ray was taken prior to incision to identify the appropriate  level.  Midline incision was made over the spinous processes of L1-L2.  Using the Bovie cutting current, the incision was carried down the  spinous processes.  Subperiosteal dissection was then carried out  bilaterally on the spinous processes, lamina, and facet joints.  Self-  retaining retractor was placed for exposure.  X-rays showed approach at  the appropriate level.  Using the Leksell rongeur, the spinous processes  and interspinous ligament were removed.  Starting on the patient's left  side, a generous laminotomy was performed by removing the inferior one-  half of the L1 lamina, the medial one-third of the facet joint, and the  superior one-half of the L2 lamina.  Residual bone and ligamentum flavum  were removed in a piecemeal fashion.  Similar decompression was then  carried out on the right.  When that was completed, the residual midline  structures were removed to complete the bilateral decompression.  The  lateral gutter stenosis was then reversed by underbiting the residual  facet with a Kerrison punch.  L2 nerve root foraminotomies were then  carried out as well.  At this time, inspection was carried out  in all  directions for any evidence of residual compression, and none could be  identified.  Large amounts of irrigation were carried out.  Any bleeding  was controlled with bipolar coagulation and Gelfoam.  The wound was then  closed in multiple layers of Vicryl in the muscle, fascia, subcutaneous,  and subcuticular tissues, and staples were placed on the skin.  A  sterile dressing was then applied.  The patient was extubated and taken  to the recovery room in stable condition.           ______________________________  Reinaldo Meeker, M.D.     ROK/MEDQ  D:  05/04/2007  T:  05/04/2007  Job:  [id number]

## 2011-03-23 NOTE — H&P (Signed)
Tammy Salinas, CUE NO.:  [id number]   MEDICAL RECORD NO.:  [id number]          PATIENT TYPE:  INP   LOCATION:  5016                         FACILITY:  [facility]   PHYSICIAN:  Tammy Salinas, M.D.DATE OF BIRTH:  [birth year]   DATE OF ADMISSION:  02/13/2008  DATE OF DISCHARGE:                              HISTORY & PHYSICAL   PRIMARY CARE PHYSICIAN:  Dr. Lupita Salinas.   CHIEF COMPLAINT:  Left femoral fracture.   HISTORY OF PRESENT ILLNESS:  Tammy Salinas is a very pleasant 77 year old  African American female, patient of [facility], who fell  on February 11, 2008, while maneuvering her walker around her bathroom at  [employer].  Since then, she has had significant pain in  her left groin area and has been unable to ambulate.  She was seen  earlier on day of admission  at the [facility],  who referred her to get an x-ray of her left hip.  This x-ray showed a  new fracture and thus we will call for admission.  She denies any  syncopal episode or  dizziness surrounding her fall.  She states she has  difficulty holding onto her walker due to arthritis pain, primarily in  her right hand and lost her grip on her walker.  She and both the walker  fell as a result.  She denies hitting her head  or injuring anything  else at this time.  She has no other complaints today.   PAST MEDICAL HISTORY:  She is a G4, P4.  She has diverticulosis and  external hemorrhoids on colonoscopy in July 2005.  She has  hypothyroidism, hypertension, and  polymyalgia rheumatica, restless legs  syndrome, gout, depression, and history of anemia, peripheral edema,  constipation, urge incontinence, and a questionable history of  hyperlipidemia and chronic kidney disease.   PAST SURGICAL HISTORY:  She had  an umbilical herniorrhaphy, total  abdominal hysterectomy with bilateral salpingo-oophorectomy for fibroids  in her 40s.  She had a cervical  3, 4 diskectomy and fusion for  myelopathy in 2006, and had a lumbar  1, 2 stenosis decompressed on May 04, 2007.   FAMILY HISTORY:  Her mother died of CHF at age 63.  Her family history  of coronary artery disease __________ first degree of relatives less  than 50 when her father died.  She has a family history of kidney  disease from which her brother died.  Her sister died of Alzheimer's  disease.  She has 12 siblings of which 2 are living.   SOCIAL HISTORY:  She is retired from [employer] and she is widowed.  Her  husband died in 2007-01-25.  One son was killed in a boiler explosion in  the [employer] during [employer].  She has a very supportive son  who is a  Optician, dispensing and visits her nearly daily.  She has never smoked and does not  use alcohol.   CURRENT MEDICATIONS:  1. Protonix 40 mg p.o. daily.  2. Lipitor 40 mg p.o. q.h.s.  3. Norvasc  10 mg p.o. daily.  4. ReQuip 0.5 mg p.o. q.h.s.  5. Prednisone 10 mg p.o. daily.  6. Catapres 0.1 mg 1/2 tablet b.i.d.  7. Synthroid 50 mcg p.o. daily.  8. __________  150 mg p.o. daily.  9. Therapeutic multivitamins p.o. daily.  10.Ensure 1 can p.o. b.i.d.  11.Senolax 8.6/50 mg tabs 2 tablets b.i.d.  12.Colchicine 0.6 mg p.o. b.i.d.  13.Tylenol 1000 mg p.o. t.i.d.  14.Bisacodyl 10 mg suppository p.r.n.  15.Ambien 5 mg p.o. q.h.s. p.r.n.  16.Allopurinol  200 mg p.o. daily.  17.Detrol LA 2 mg p.o. daily.  18.MiraLax powder 17 grams by mouth p.o. daily.  19.OxyContin 30 mg p.o. b.i.d.  20.Oxycodone 5 mg tabs 1 tablet q.4h. p.r.n. pain.  21.Furosemide 20 mg 2 tablets q.a.m.  22.Alendronate 70 mg tabs one every  week.  23.Flonase 50 mcg/act suspension p.r.n.  24.Wellbutrin XL 300 mg p.o.  daily.  25.Klor-Con 20 mEq p.o. daily.  26.Os-Cal one p.o. b.i.d..   ALLERGIES:  HYDROCHLOROTHIAZIDE.   REVIEW OF SYSTEMS:  Negative for fever, chills, loss of appetite,  weakness, eye pain, blurry vision, decreased hearing, nasal congestion,  sore  throat, chest pain or discomfort, fainting, shortness of breath  with exertion, chest discomfort, abdominal pain, change in bowel habits,  rash, memory loss, seizures, weakness, or abnormal bruising.  Positive  for occasional shortness of breath, for joint pain, for a history of  falling down, and depression.   PHYSICAL EXAMINATION:  VITALS:  Revealed O2 sat 96% on room air,  temperature 98.9, pulse rate 71, respiratory rate 18, blood pressure  126/81.  In general, this is an obese, well-developed, well-nourished,  well-hydrated African American female, in no apparent distress.  HEENT:  Reveals normocephalic and atraumatic without obvious  abnormalities.  There is no apparent alopecia or balding.  There is no  corneal or conjunctival inflammation noted.  Extraocular muscles are  intact.  Pupils are equal, round, and reactive to light.  Ears reveal no  external deformities.  Nares show no deformity or inflammation.  The  mucosa are pink and moist without lesion or exudates.  Mouth reveals  oral mucosa and oropharynx without lesions or exudate.  She is  edentulous.  NECK:  Supple without masses or deformities noted.  LUNGS:  Reveal normal  respiratory effort and chest expands  symmetrically.  Lungs are clear to auscultation bilaterally without  wheezes, rales, or rhonchi.  HEART:  Reveals regular rate and rhythm with normal S1 and S2 and no  murmurs, rubs or gallops appreciated.  ABDOMEN:  Nontender, nondistended without masses, organomegaly, or  hernias noted.  Bowel sounds are positive.  There are many surgical  scars from previous surgeries and associated scar tissue that is  palpable.  MUSCULOSKELETAL:  Reveals a left leg that is internally rotated and  lengthened.  She is able to wiggle toes and ankles bilaterally.  There  is pain with passive rotation of the left hip but none with passive  rotation of the right hip.  She is able to lift left leg slightly off  the bed.  There is  bruising noted in the left shin.  There is pain with  passive movement of right ankle but no swelling appreciated.  Frequent  movement of the jaws appreciated in achieving motion.  There is pain  with movement of the right hand which is chronic.  She is able to  squeeze with both hands equally.  Pulses are equal and full in all  extremities.  EXTREMITIES:  Reveal no edema.  NEUROLOGIC:  There is no cranial nerve deficits noted.  Plantar reflexes  are downgoing bilaterally and DTRs are symmetrical throughout.  Sensory,  motor, and coordinator functions appear to be intact, and she is alert  and oriented to person and place and was able to  date the month but  said that the year was 2006.  PSYCH: Cognition and judgment appear intact.  She is alert and  cooperative with normal attention span and concentration.  There are no  evident delusions or hallucinations at this point.   LABORATORY STUDIES:  Reveal PTT 25, INR 0.9.  CBC reveals white blood  cell count 8.9, hemoglobin 15.2, hematocrit 45, platelet count 194 with  normal differential.  Comprehensive metabolic panel reveals sodium 161,  potassium 3.9, chloride 103, bicarb 27, BUN 23, creatinine 1.3, glucose  135, total bilirubin 0.7, alkaline phosphatase 61, AST 22, ALT 7,  albumin 3.7, calcium 10.3.  Urinalysis reveals pH 6.5, specific gravity  of 1.011, small blood 0.2, urobilinogen, large leukocytes,  many  epithelial cells, too numerous cell count white blood cells, 3 to 6 red  blood cells, and many bacteria.  X-rays revealed of the left hip and  oblique nondisplaced fracture of the proximal left femur that involves  the lesser femoral trochanter.   ASSESSMENT/PLAN:  Ms. Lucado is a very pleasant 77 year old African  American female with a left femoral fracture, status post fall, and  multiple other medical problems as outlined below.  1. Left femoral fracture.  We have consulted  [facility],      Dr. Shon Baton,  to  evaluate for definitive treatment.  He wishes the      patient to be placed by the orthopedic tech in a 10-pounds Buck's      traction to the left leg.  In the meantime, we will treat her pain      with her home regimen of OxyContin 30 mg by mouth b.i.d. and      oxycodone 5 mg by mouth q.4h. p.r.n. pain.  We will also add      Tylenol scheduled 1000 mg by mouth t.i.d.  We will give an      additional 2 mg of morphine IV q.4h. p.r.n. pain, and we will      provide calcitonin nasal spray in alternating nostrils daily for      bone pain.  We will continue her on her calcium and vitamin D      supplementation.  The patient  has been on bisphosphate at      baseline.  We will follow up on the chest x-ray and left rib films      to make sure  there is no other fracture found.  The patient is not      complaining of any pain to [place], so I do not expect this to be      positive.  We will get an echocardiogram in addition to help do a      cardiac rule out prior to surgery.  2. Polymyalgia rheumatica, we will continue the patient on her home      prednisone dose of 10 mg by mouth daily.  3. Gout.  We will continue on her home colchicine and allopurinol.  4. History of anemia.  We will not continue iron at this point because      she is no longer anemic.  We will continue to monitor her CBC  daily.  5. Hypothyroidism.  We will continue her on her home dose of      Synthroid.  We will not check a TSH today because the patient does      not complain of any symptoms suggestive of over or undertreatment      of her thyroid.  6. Hypertension.  We will continue the patient on her home meds for      now and monitor her blood pressures while in house.  We will      titrate her medications as needed.  7. Depression.  We will continue her on her Wellbutrin XL.  8. Peripheral edema.  We will continue the patient on her Lasix,  9. Restless legs syndrome.  We will continue the patient on her       ReQuip.  10.Constipation.  We will continue her on her home bowel regimen and      discontinue her iron supplementation at this point.  We will      monitor and treat as needed with further treatments.  11.Questionable hyperlipidemia.  We will continue the patient on      statin that she was on at home.  We then want to check a fasting      lipid panel in the near future.  12.Urge incontinence.  We will continue the patient on her Detrol LA      at her home dose.  13.Question of chronic kidney disease.  The patient's creatinine is      currently 1.3.  It seems that this is very close to her baseline      after looking back at old records.  We will monitor her creatinine      throughout her hospital stay to make sure it does not elevate.  14.Prophylaxis.  We will use Lovenox and systemic compression devices      for deep venous thrombosis prophylaxis.  Just prior to surgery, she      will need this Lovenox stopped.  We will continue her on her PPI.      She will need physical therapy/occupational therapy consult after      definitive treatment.  We have consulted social work to help      facilitate getting the patient back to her on skilled nursing      facility once she is able to be medically transferred back.  15.Fluids, electrolytes, and nutrition/gastrointestinal.  Her      electrolytes are currently stable, and we will provide a low-sodium      heart healthy diet.  Her constipation bowel regimen will be per her      home medication regimen.   DISPOSITION:  The patient should go back to [facility]  once definitive treatment likely surgical repair of the left femoral  fracture  occurs, and the patient clears postoperatively.      Ancil Boozer, MD  Electronically Signed      Tammy Salinas, M.D.  Electronically Signed    SA/MEDQ  D:  02/13/2008  T:  02/14/2008  Job:  [id number]

## 2011-03-23 NOTE — Discharge Summary (Signed)
NAMESHARDAE, Tammy Salinas                ACCOUNT NO.:  [id number]   MEDICAL RECORD NO.:  [id number]          PATIENT TYPE:  INP   LOCATION:  5012                         FACILITY:  [facility]   PHYSICIAN:  Wilson Singer, M.D.DATE OF BIRTH:  11-Nov-1932   DATE OF ADMISSION:  04/25/2007  DATE OF DISCHARGE:                               DISCHARGE SUMMARY   INTERIM DISCHARGE SUMMARY:   HISTORY:  This is a very pleasant 77 year old lady who presented with  weakness of her legs and pain in her back.  Please see initial history  and physical examination by Dr. Isidor Holts.   HOSPITAL PROGRESS:  It was felt that her weakness was largely due to  dehydration and she was started on intravenous fluids.  However, once  she was hydrated, she still continued to be weak and neurology  consultation was obtained.  Neurologist, Dr. 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  A repeat cervical MRI and thoracic MRI as well as a  lumbar MRA/MRI seemed to confirm this.  She felt that neurosurgery  should be consulted.  Dr. Jordan Likes saw the patient on June 20 and felt that  she may benefit from decompression surgery at L1-L2 to help her pain and  weakness.  Dr. Gerlene Fee is the surgeon and knows the patient from  previously and he will be back to see the patient early next week and  contemplate surgery.  In the meantime, she will stay in the hospital.  During her stay in the hospital, she has had a fever of 101.8.  Blood  cultures and urine cultures have been drawn and are negative so far.  Chest x-ray is negative for infiltrate.  On physical examination today,  there are no clinical changes, temperature 98.8, blood pressure 119/74,  pulse 62, saturations 96%.  There are no new clinical findings.   INVESTIGATIONS:  Show a sodium of 139, potassium 3.9, bicarbonate 21,  BUN increasing to 40, creatinine increasing to 1.38, hemoglobin 11.1,  white blood cell count 9.9, platelets 281.   Medications at present consist of:  1. Protonix 40 mg q.day.  2. Prozac 10 mg q.day.  3. Requip 0.25 mg q.h.s.  4. Clonidine 0.1 mg b.i.d.  5. Norvasc 5 mg q.day.  6. Prednisone 5 mg q.day.  7. Neurontin 100 mg t.i.d.  8. Celebrex 200 mg b.i.d.  9. Levothyroxine 50 mg q.day.  10.Lipitor 40 mg q.day.  11.Amoxicillin 5 mg t.i.d.   FURTHER DISPOSITION:  Urine culture today was reviewed again and is  positive for enterococcal species sensitive to amoxicillin that she had  been started on previously.  I am going to give her some intravenous  fluids today to make sure that she does not sink back into further  dehydration and hopefully this will improve matters.  Otherwise, we will  await neurosurgeon to see her.      Wilson Singer, M.D.  Electronically Signed     NCG/MEDQ  D:  04/30/2007  T:  04/30/2007  Job:  [id number]  cc:   Renaye Rakers, M.D.  [facility]oren[facility] [place]ry

## 2011-03-24 ENCOUNTER — Encounter: Payer: Self-pay | Admitting: Family Medicine

## 2011-03-24 ENCOUNTER — Non-Acute Institutional Stay: Payer: Self-pay | Admitting: Family Medicine

## 2011-03-24 DIAGNOSIS — Z593 Problems related to living in residential institution: Secondary | ICD-10-CM

## 2011-03-24 NOTE — Progress Notes (Signed)
  Subjective:    [name], female    DOB: September 20, 1933, 77 y.o.   MRN: [id number]  HPI  Chronic Issues: 1. Hemorrhoids: Rx Miralax, Tucks, Hydrocortisone.   2. Schizophrenia / Dementia: Rx Seroquel, Aricept, Cymbalta. MMSE 22/30. Psych previously reccommended decreasing Cymbalta and Trazodone and starting Klonopin. We would like to avoid benzos, so are holding on this recommendation.   3. Chronic cystitis: Pt with Hx multiple UTIs. Followed by [facility]. Do not get UA unless systemic symptoms.  4. Chronic Pain: Dx Gout, Back Pain, Hx PMR - Continues to c/o pain chronically. On Tylenol, Gabapentin, and Oxycontin.  Weaned from prednisone with no change in function. NOTE: PATIENT HAS CHRONIC RIGHT HAND PAIN.This is thought to be 2/2 to Cervical Spondylosis with myelopathy. Dr. Sheffield Slider suspects this is the main cause of her quadriparetic state. She is not a surgical candidate due to prolonged myelopathy.  5. L femur frx: Pt with fall 4/409 with L femur frx; s/p ORIF 02/14/08 by Dr Shon Baton.  No longer on coumadin. Repeat xray of leg per ortho showed separation at repair site. Pt no longer ambulatory. Rx ASA, Calcium, Vitamin D, Bisphosphonate.  6. Hypothyroidism: Rx Synthroid.   7. HTN: Rx Norvasc. BPs stable.  8. Nutrition/: Rx: Vitamin C,  Beneprotein, Med Pass supplements, ST, dysphagia diet.  9. HLD: Rx Lipitor. Trig 220. Chol 131. HDL 35. LDL 52.  10. Dyspepsia: Rx Omeprazole.  11. CKD: Unchanged. Creatinine 1.15 (01/21/11).  12. Optho: Nuclear sclerosis both eyes, glaucoma. Recheck  q 6 months.  13. NOTES: Patient has [facility]. DNR/DNI.  Review of Systems SEE HPI.    Objective:   Physical Exam General:  Awake, lying in bed. Vitals reviewed. Lungs:  Normal respiratory effort and normal breath sounds.   Heart:  Normal rate, regular rhythm, and no murmur.   Abdomen:  Soft, non-tender, and no distention.  Obese. Msk:  Tender but not swollen area over distal right  wrist. Less painful for her to move, but unable to dorsiflex past neutral. Complains of  pain with passive movement of legs. Pulses:  Decreased but palpable DP, feet warm. Extremities:  Trace edema, feet warm, venous stasis changes, no open lesions.   Assessment & Plan:

## 2011-03-25 ENCOUNTER — Encounter: Payer: Self-pay | Admitting: Pharmacist

## 2011-03-25 DIAGNOSIS — K649 Unspecified hemorrhoids: Secondary | ICD-10-CM

## 2011-03-25 DIAGNOSIS — G4700 Insomnia, unspecified: Secondary | ICD-10-CM

## 2011-03-26 NOTE — Procedures (Signed)
[facility]  Patient:    JAQUIA, BENEDICTO Visit Number: [id number] MRN: [id number]          Service Type: OUT Location: RAD Attending Physician:  Cain Sieve Dictated by:   [place] Bing, M.D. Proc. Date: 04/12/02 Admit Date:  04/12/2002 Discharge Date: 04/12/2002                              Echocardiograms  REFERRING:  Geraldo Pitter, M.D. and Valera Castle, M.D.  CLINICAL DATA:  A 77 year old woman with rheumatic mitral valve disease and hypertension.  1. Technically suboptimal, but adequate echocardiographic study. 2. Mild left atrial enlargement, normal right atrium and right ventricle. 3. Normal aortic valve with mild annular calcification. 4. Normal tricuspid and pulmonic valves. 5. Rheumatic mitral valve with mild focal thickening of the mitral valve, mild    stenosis with a valve area of 1.5 sq cm by Doppler and 1.8 sq cm by    planimetry. 6. Normal internal dimension of the left ventricle, mild concentric LVH,    normal regional and global LV systolic function. 7. Normal IVC. 8. Comparison with prior study of March 02, 2000:  No significant interval    change. Dictated by:   [place] Bing, M.D. Attending Physician:  Cain Sieve DD:  04/12/02 TD:  04/16/02 Job: [id number]

## 2011-03-26 NOTE — Op Note (Signed)
Tammy Salinas, Tammy Salinas                          ACCOUNT NO.:  [id number]   MEDICAL RECORD NO.:  [id number]                   PATIENT TYPE:  AMB   LOCATION:  DAY                                  FACILITY:  [facility]   PHYSICIAN:  Lionel December, M.D.                 DATE OF BIRTH:  09-Nov-1932   DATE OF PROCEDURE:  05/27/2004  DATE OF DISCHARGE:                                 OPERATIVE REPORT   PROCEDURE:  Total colonoscopy.   INDICATIONS FOR PROCEDURE:  Tammy Salinas is a 77 year old African American  female who is here for screening colonoscopy.  Family history is negative  for colorectal carcinoma.  The procedure and risks were reviewed with the  patient, and informed consent was obtained.   PREOPERATIVE MEDICATIONS:  Demerol 25 mg IV, Versed 4 mg IV.   FINDINGS:  The procedure was performed in the endoscopy suite.  The  patient's vital signs and O2 saturations were monitored during the procedure  and remained stable.  The patient was placed in the left lateral recumbent  position and rectal examination performed.  No abnormalities were noted to  external or digital exam.  The Olympus videoscope was placed into the rectum  and advanced into the region of the sigmoid colon and beyond.  She had  scattered diverticula throughout the colon, but most of these were in the  sigmoid colon.  The preparation was satisfactory, except she had formed  stool in her ascending colon, somewhat limiting the quality of the exam.  The cecal landmarks, i.e., appendiceal orifice and ileocecal valve were well-  seen.  As the scope was withdrawn, the colonic mucosa was once again  carefully examined.  No polyps and/or tumor masses were noted.  The rectal  mucosa was normal.  There was a scar at the anorectal junction along with  hemorrhoids below the dentate line.  The endoscope was straightened and  withdrawn.  The patient tolerated the procedure well.   FINAL DIAGNOSES:  1. Pancolonic diverticulosis.  2.  External hemorrhoids.  3. Examination of part of the ascending colon was somewhat limited because     of the quality of the prep.   RECOMMENDATIONS:  1. She should continue yearly Hemoccults.  2. High fiber diet.  3. Citrucel one tablespoon full daily.  4. Colace two tablets at bedtime.      ___________________________________________                                            Lionel December, M.D.   NR/MEDQ  D:  05/27/2004  T:  05/27/2004  Job:  [id number]   cc:   Renaye Rakers, M.D.  [phone] [address], [address]  [place]  [place] [address]  Fax: [phone]

## 2011-03-26 NOTE — Discharge Summary (Signed)
Tammy Salinas, Tammy Salinas                ACCOUNT NO.:  [id number]   MEDICAL RECORD NO.:  [id number]          PATIENT TYPE:  INP   LOCATION:  6709                         FACILITY:  [facility]   PHYSICIAN:  Mobolaji B. Bakare, M.D.DATE OF BIRTH:  1933/10/19   DATE OF ADMISSION:  03/06/2007  DATE OF DISCHARGE:  03/10/2007                               DISCHARGE SUMMARY   PRIMARY CARE PHYSICIAN:  Renaye Rakers, M.D.   FINAL DIAGNOSES:  1. Lower extremity weakness following a fall.  2. Cervical myelopathy, status post spinal cord decompression in 2006.  3. Sinus bradycardia, resolved.  4. Polymyalgia rheumatica.  5. Hypercalcemia, likely secondary to hydrochlorothiazide.  6. Thrush.  7. Hypothyroidism.  8. Hypertension.  9. Dyslipidemia.  10.Chronic kidney disease, stage 3.  11.Gastroesophageal reflux disease.   PROCEDURES:  1. Chest x-ray showed no acute cardiopulmonary disease.  2. X-ray of the cervical spine showed stable fusion without evidence      of collapse.  Degenerative spondylosis, involving C4-5, C5-6, which      is old.  3. Pelvic x-ray showed no fractures.  There is history of arthritic      changes.  4. Ultrasound of the kidneys showed no hydronephrosis, bilateral renal      cysts.   BRIEF HISTORY:  Refer to the admission H&P, dictated by Dr. Lonia Blood.  In brief, Tammy Salinas is a 77 year old African-American female, who  resides in an assisted living facility.  She had spinal stenosis and  underwent surgery with C3-4 anterior cervical discectomy with bone bank  fusion in July 2006.  She has since been living in an assisted living  facility with ambulatory devices, including a motorized wheelchair.  Patient stated that on the day prior to hospitalization, her legs gave  way while returning from the bathroom and she fell.  Subsequently, she  had trouble standing on her feet, secondary to pain.  She was brought to  the emergency room for further evaluation.  She underwent  x-rays, to  include cervical x-ray, which showed stability of previous surgery, an  there was no pelvic fracture.  She was unable to return to her previous  ambulatory status.  Hence, she was admitted for physical therapy and  possible upgrade to a skilled nursing facility.   HOSPITAL COURSE:  The patient had further evaluation to include sed  rate, C-reactive protein and to further evaluate active polymyalgia  rheumatica.  Sed rate and C-reactive protein were within normal.  She  was continued on her previous dose of prednisone.  She reported that she  had pain in her feet and this makes her unable to walk.  She was started  on Neurontin.  This seems to have helped to relieve the pain and patient  is currently able to stand on her feet, but ambulation is still not back  to her baseline.  She was seen by a physical therapist.  She would  require continuation of PT, OT at the assisted living facility.   HYPERCALCEMIA:  Patient was noted to have a calcium of 10.7 on  admission.  This was mildly  elevated.  She had an intact PTH, which was  also mildly elevated at 171.  She was started on IV fluid and  hydrochlorothiazide was discontinued.  It was thought that the mild  hypercalcemia is related to hydrochlorothiazide.  She has chronic kidney  disease, which may be responsible for the mildly elevated PTH.  Nevertheless, she should have her BMET monitored closely and, if  necessary, monitor PTH.  Her phosphate and magnesium levels were normal.   SINUS BRADYCARDIA:  She had episode of sinus bradycardia with the heart  rate falling to 45 during the night.  She was not on any beta blocker.  However, she was on Clonidine and this was temporarily held.  Heart rate  has improved to mid 70s.  However, given the fact that she has been on  Clonidine and to avoid rebound hypertension, she was continued on low-  dose Clonidine 0.1 mg a half tablet b.i.d.  Blood pressure appears to be  well-controlled  on this dose.  Hydrochlorothiazide has been discontinued  in view of hypercalcemia.   ORAL THRUSH:  She was noted to have oral thrush on admission and was  started on Diflucan.   LOWER EXTREMITY WEAKNESS:  Patient's new inability to ambulate is most  likely related to a recent fall on a background of previous cervical  myelopathy.  She has no new neurological deficit.  She has a left-ankle  swelling.  X-ray of the ankle did not show any fractures.   CERVICAL MYELOPATHY AND WEAKNESS:  There is no obvious infection  contributing to the weakness.  Urinalysis was unremarkable.  Chest x-ray  showed no pneumonia.   DISCHARGE MEDICATIONS:  1. Celebrex 200 mg b.i.d.  2. Lipitor 40 mg daily.  3. Lasix 80 mg daily.  4. Requip 0.5 mg daily.  5. Omeprazole 20 mg daily.  6. Clonidine 0.1 mg a half tablet b.i.d.  7. Prednisone 5 mg daily.  8. Vicodin 1-2 p.o. q. 4 hours p.r.n.  9. Lactulose 30 mL daily p.r.n. constipation.  10.Alprazolam 0.5 mg q. 8 hours p.r.n. anxiety.  11.Ambien 10 mg p.o. q.h.s. sleep.  12.Synthroid, to continue with previous dose.  Patient cannot recall      dose.  13.Gabapentin 100 mg p.o. t.i.d.  14.Potassium chloride 10 mEq daily.  15.Tylenol 650 mg p.o. q. 4 hours p.r.n.  16.Norvasc 5 mg p.o. daily.   DISCHARGE LABORATORY DATA:  Sodium 143, potassium 3.8, chloride 111, CO2  24, glucose 81, BUN 20, creatinine 1.14, calcium 10.1, intact PTH 171.3,  C-reactive protein 0.1, sed rate 19, hemoglobin A1c 5.7, TSH 0.972,  creatinine kinase 75, magnesium 2.1, phosphorus 3.5, BNP 64.      Mobolaji B. Corky Downs, M.D.  Electronically Signed     MBB/MEDQ  D:  03/10/2007  T:  03/10/2007  Job:  [id number]   cc:   Renaye Rakers, M.D.

## 2011-03-26 NOTE — H&P (Signed)
NAMEMICHEALE, SCHLACK NO.:  [id number]   MEDICAL RECORD NO.:  [id number]          PATIENT TYPE:  EMS   LOCATION:  MAJO                         FACILITY:  [facility]   PHYSICIAN:  Lonia Blood, M.D.       DATE OF BIRTH:  1933-08-30   DATE OF ADMISSION:  03/06/2007  DATE OF DISCHARGE:                              HISTORY & PHYSICAL   PRIMARY CARE PHYSICIAN:  Renaye Rakers, M.D.   CHIEF COMPLAINTS:  Weakness of the lower extremities.   HISTORY OF PRESENT ILLNESS:  Mrs. Kimbrough is a 77 year old African  American woman with history of cervical spine stenosis with myelopathy  who is status post decompression surgery in 2006, who was brought to the  emergency room today by the ambulance after she has been getting  progressively weaker for the past 2 weeks.  The patient and her family  report that after the surgery, the patient was able to ambulate  extremely difficult by using a walker, and this has gotten worse and  worse over the course of the years to a point where for the past couple  of weeks the patient has not been able to ambulate at all.  The patient  denies any urinary incontinence, denies fecal incontinence.  She reports  some lower extremity pain in the left heel.  The patient also reports  that both her hips are extremely painful and she thinks that is why she  cannot move her legs.   PAST MEDICAL HISTORY:  1. Chronic kidney disease, stage III.  2. Hypertension.  3. Hypothyroidism.  4. Gastroesophageal reflux disease.  5. Status post total hysterectomy and bilateral salpingo-oophorectomy.  6. Status post appendectomy.  7. C3-C4 decompression surgery by Dr. Gerlene Fee in 2006, for cervical      spine stenosis.  8. Questionable polymyalgia rheumatic.  9. Questionable restless leg syndrome.   MEDICATIONS:  At the assisted living:  1. Celebrex 200 mg twice a day.  2. Lipitor 40 mg daily.  3. Furosemide 40 mg daily.  4. Norvasc 10 mg daily.  5. Requip 0.5 mg at  bedtime.  6. Omeprazole 20 mg daily.  7. Hydrochlorothiazide 25 mg daily.  8. Clonidine 0.3 mg daily.  9. Prednisone 5 mg daily.  10.Vicodin as needed for pain.  11.Furosemide 20 mg daily.  12.Lactulose 30 mL daily for constipation.  13.Alprazolam 0.5 mg three times a day as needed for anxiety.  14.Ambien 10 mg at bedtime as needed for sleep.  15.Levaquin 500 mg, the patient took this medication from February 11, 2007 to February 18, 2007.  16.Also the patient has been started on Synthroid ever since February 23 1007, unknown dose.   ALLERGIES:  NO KNOWN DRUG ALLERGIES.   SOCIAL HISTORY:  The patient is a widow.  She never smoked cigarettes,  never drank alcohol.  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  She  has three living children.   FAMILY HISTORY:  Noncontributory secondary to age.   REVIEW OF SYSTEMS:  The patient denies any  chest pain, denies shortness  of breath, reports a dry cough.  She also reports some diffuse abdominal  tenderness.  The patient reports some bilateral shoulder pain and some  dry mouth.  Other systems as per HPI.  All other systems are negative.   PHYSICAL EXAMINATION:  VITAL SIGNS:  Upon admission shows a temperature  97.3, blood pressure 120/73, pulse 57, respirations 28, saturation 90%  on room air.  GENERAL APPEARANCE:  Obese, African American woman lying on the  stretcher in no acute distress.  Alert and oriented  to place, person  and time.  HEAD:  Normocephalic, atraumatic.  EYES: Pupils equal and round, reactive to light and accommodation.  Extraocular movements intact.  THROAT:  Clear.  ORAL CAVITY:  The patient's mouth has obvious whitish deposits on the  tongue and cheeks consistent with thrush.  The patient is edentulous.  NECK:  Supple.  No JVD.  No carotid bruits.  CHEST:  Clear to auscultation bilaterally without wheezes, rhonchi,  crackles.  HEART:  Regular rate and rhythm without murmurs, rubs or gallop.  ABDOMEN:  Obese,  diffusely tender but in a very mild fashion.  There is  no rebound, no guarding.  Bowel sounds are present.  LOWER EXTREMITIES:  Have plus three bilateral edema which is symmetric.  There is no  tenderness of the calf.  SKIN:  Warm, dry, without any suspicious rashes.  MUSCULOSKELETAL:  There is tenderness of the mobilization of the passive  movement of the knees bilaterally as well as the hips.  There is  crepitus of both knees bilaterally.  The examination of the hands, there  is no significant involvement of the DIP or PIP joints by arthritis.  NEUROLOGIC:  Deep tendon reflexes are absent in the lower extremities.  Plantars are equivocal in the lower extremities.  Sensation seems to be  intact in the lower extremities bilaterally.  Cranial Nerves II to XII  are intact.  Upper extremities with strength five out of five and intact  sensation.   LABORATORY VALUES:  At the time of admission urinalysis within normal  limits.  White blood cell count 7.5, hemoglobin 13, platelet count 261.  Sodium 139, potassium 3.6, chloride 102, bicarb 29, BUN 45, creatinine  1.5, calcium 10.7.  Albumin 3.5, other LFTs are within normal limits.  EKG shows sinus bradycardia without major ST-T changes.  Portable chest  x-ray shows no acute disease.   ASSESSMENT/PLAN:  1. Worsening lower extremity weakness.  This probably is      multifactorial and is related to the most likely to progressive      deconditioning.  Also another possibility is that the patient may      have worsening osteoarthritis to a point where she cannot move her      hips.  My plan is to check a C-spine x-ray to assure that the plate      has not moved, and to check a bilateral hip and pelvis x-ray to      assure there is no acute pathology.  For now, we will treat the      patient conservatively with pain control, physical therapy, and      steroids.  Also, I do suspect that the patient's significant     difficulty walking is also  secondary to her massive edema.  The      cause of the patient's edema is unclear, but I can envision a few      things which  could be causing her edema.  For this reason, we will      check a transthoracic echocardiogram, rule out congestive heart      failure.  Will check a lower extremity venous Doppler to rule out      DVTs.  And, we will recheck a TSH to see how adequately the      hypothyroidism is replenished.  The patient does not seem to have      significant hypoalbuminemia but she has a degree of renal failure,      and she may require more diuretics than she is already getting.  2. Hypercalcemia.  This is most likely related to the patient's use of      hydrochlorothiazide.  I will discontinue the patient's HCTZ as it      is not helping her anyway with the reduced GFR.  I will also check      a PTH, vitamin D, magnesium, phosphorus to further assess.      Lonia Blood, M.D.  Electronically Signed     SL/MEDQ  D:  03/06/2007  T:  03/06/2007  Job:  [id number]   cc:   Renaye Rakers, M.D.

## 2011-03-26 NOTE — H&P (Signed)
NAMEAVANA, KREISER NO.:  [id number]   MEDICAL RECORD NO.:  [id number]          PATIENT TYPE:  INP   LOCATION:  1603                         FACILITY:  [facility]   PHYSICIAN:  Myrtie Neither, MD      DATE OF BIRTH:  12-21-1932   DATE OF ADMISSION:  05/21/2005  DATE OF DISCHARGE:                                HISTORY & PHYSICAL   CHIEF COMPLAINT:  Severe pain and weakness in both upper and lower  extremities with cold sensation in both hands x1 week.   HISTORY OF PRESENT ILLNESS:  This is a 77 year old female who had been  followed in the office for degenerative joint disease bilateral knees. The  patient states that the early part of June she had been unable to stand or  walk because of weakness and falling, giving away of both lower extremities.  The patient had cardiovascular evaluation because of a syncopal episode and  was found to have rheumatic valvular disease. The patient over the past week  has had severe proximal upper and proximal lower extremity muscle pain,  weakness of both upper extremities and both lower extremities with cold  sensation in the fingers, severe pain in the neck area, severe episodic  headaches with progressive weakness to being bedridden. No history of any  severe trauma other than the patient's had several falls which her daughter  describes as the patient having weakness in the legs and sliding down onto  the floor.   REVIEW OF SYMPTOMS:  Constipation over the past week, frequency but no  dysuria, no chest pain, no shortness of breath, severe headaches, some  episodes of urinary incontinence followed by severe joint pains and muscle  pains in both upper and lower extremities.   ALLERGIES:  None known.   MEDICATIONS:  Hydrocodone, quinine sulfate, clonidine, Lexapro, Norvasc,  Celebrex and Lipitor.   SOCIAL HISTORY:  The patient denies any use of alcohol or tobacco.   PAST SURGICAL HISTORY:  Left shoulder as well as nasal  surgery and  hysterectomy.   FAMILY HISTORY:  High blood pressure, heart disease, rheumatoid disease.   PHYSICAL EXAMINATION:  GENERAL:  Alert and oriented, no acute distress. The  patient sits in a wheelchair, appears to have a continuous movement of the  head and neck with forward flexion holding both hands in her laps with a  pronated position of both hands. The patient is able to actively move both  upper and lower extremities but very restricted and appears to be in pain.  VITAL SIGNS:  Temperature 99.4, pulse 80, respirations 18, blood pressure  142/68. Height 5 feet 5 inches, weight 200. O2 saturation 98%.  HEENT:  Head normocephalic, nontender. Eyes, conjunctiva and sclera clear.  NECK:  Tender paracervical muscles, also tender along the trapezius muscle  down to the upper dorsal spine. Range of motion, some guarding on rotation  and lateral bending.  EXTREMITIES:  Compression test demonstrates pain down to both shoulders. The  patient has weak grip and pinch in both hands and appeared to have  dysplastic skin changes involving both fingers.  Positive Tinel's bilateral  wrist with hyperesthesia in both fingers of both hands. Right shoulder  tender anterior and laterally with limited abduction and full extension.  Left shoulder nontender to palpation but obvious muscle weakness on the  proximal musculature. The patient demonstrates tenderness in the proximal  muscles, both shoulders as well as both __________ areas. Deep tendon  reflexes are brisk with triceps, triceps and brachioradialis. Lumbar spine  nontender to palpation. Straight leg raise negative. __________ negative.  Extensor Hallicis weak bilaterally. Deep tendon reflexes both patella and  Achilles are brisk bilaterally. Negative Homans test. Bilateral knees +2  effusion with mild increased warmth, crepitus both medial and lateral  compartments of both knees. Weak extension of both knees as well as weak hip   extensors.   IMPRESSION:  Extreme weakness of both upper and lower extremity, cervical  spinal stenosis, degenerative disk disease cervical and lumbar spine, rule  out rheumatoid disease, rule out neuromuscular disease, rule out mytotic  disease.   PLAN:  Laboratory for rheumatoid and neuromuscular diseases, neurological  evaluation with consult, pain control. Will have MRI of the cervical as well  as lumbar spine.       AC/MEDQ  D:  05/21/2005  T:  05/21/2005  Job:  [id number]

## 2011-03-26 NOTE — Discharge Summary (Signed)
Tammy Salinas, Tammy Salinas NO.:  [id number]   MEDICAL RECORD NO.:  [id number]          PATIENT TYPE:  IPS   LOCATION:  4010                         FACILITY:  [facility]   PHYSICIAN:  Ellwood Dense, M.D.   DATE OF BIRTH:  1933-03-25   DATE OF ADMISSION:  05/27/2005  DATE OF DISCHARGE:  06/09/2005                                 DISCHARGE SUMMARY   DISCHARGE DIAGNOSES:  1.  Anterior cervical diskectomy with fusion, cervical 3-4 stenosis and      myelopathy May 25, 2005.  2.  Pain management.  3.  Subcutaneous Lovenox for deep venous thrombosis prophylaxis.  4.  Hypertension.  5.  Escherichia coli urinary tract infection.  6.  Hyperlipidemia.  7.  Depression.  8.  Degenerative joint disease of bilateral knees.   HISTORY OF PRESENT ILLNESS:  The patient is a 77 year old black female  admitted July 14th with falls, progressive quadriparesis.  She denied any  recent trauma.  MRI of the cervical spine with cervical 3-4 spinal stenosis  with myelopathy.  Underwent anterior cervical diskectomy with fusion,  cervical 3-4, May 25, 2005 by Dr. Gerlene Fee.  Postoperative pain control with  Decadron taper completed.  Blood pressures monitored.  Home medications were  yet to be resumed.  She was admitted for a comprehensive rehabilitation  program.   PAST MEDICAL HISTORY:  See discharge diagnoses.   ALLERGIES:  NONE.   SOCIAL HISTORY:  Lives with her daughter and grandson.  Daughter works 7  a.m. to 5 p.m.  Lucila Maine attends school.  Patient has a home health aide two  hours a day.  She has one daughter who is a Diplomatic Services operational officer on [address] at [employer].   MEDICATIONS PRIOR TO ADMISSION:  1.  Hydrocodone as needed.  2.  Quinine sulfate 325 mg at bedtime.  3.  Clonidine 0.3 mg twice daily.  4.  Lexapro 20 mg daily.  5.  Norvasc 10 mg daily.  6.  Lipitor 40 mg daily.  7.  Celebrex 200 mg daily.   HOSPITAL COURSE:  The patient with progressive gains while on  rehabilitation  services, with therapies initiated on a b.i.d. basis.  The following issues  were followed during patient's rehabilitation course.   Pertaining to Tammy Salinas anterior cervical diskectomy with fusion,  surgical site healing nicely.  Cervical collar in place.  She will follow up  with Dr. Gerlene Fee of neurosurgery in one to two weeks.  Functionally, she  continued to progress with functional aspects.  She was minimal assist for  ambulation greater than 50 feet with a rolling walker.  Simple set up for  upper body bathing and dressing, minimal assist lower body.  Minimum to  moderate assist bed to chair transfers.  Home health therapy has been  arranged.  Full family teaching was completed.  Pain management ongoing with  the use of Vicodin as needed, with good results.  She was on subcutaneous  Lovenox throughout her rehabilitation course for deep venous thrombosis  prophylaxis.  Her home regimen of clonidine and Norvasc were resumed for her  hypertension, with good results.  She remained on her Lexapro for history of  depression.  She was motivated for her therapies attending as needed.  She  was treated with amoxicillin for a seven-day course for E coli urinary tract  infection.  She denied any dysuria or hematuria.   LABORATORY DATA:  Most recent labs revealed a hemoglobin of 12.2, hematocrit  36.2, WBC 8.1, platelets 276,000.  Sodium 143, potassium 3.7, BUN 38,  creatinine 1.4.  It was noted that she did have a mild history of renal  insufficiency and monitored.   DISPOSITION:  She was discharged to home in stable condition.   DISCHARGE MEDICATIONS:  1.  Lexapro 20 mg p.o. daily.  2.  Lipitor 40 mg p.o. daily.  3.  Norvasc 10 mg p.o. daily.  4.  Celebrex 200 mg p.o. daily.  5.  Quinine sulfate 325 mg p.o. q.h.s.  6.  Protonix 40 mg p.o. daily.  7.  Clonidine 0.3 mg p.o. b.i.d.  8.  Amoxicillin 250 mg p.o. t.i.d. until June 13, 2005 and then stop.  9.  Vicodin as  needed for pain.   ACTIVITY:  As tolerated.   DIET:  Regular.   SPECIAL INSTRUCTIONS:  Home health, physical and occupational therapy.  She  should follow up with Dr. Gerlene Fee in neurosurgery as advised.      Danie   DA/MEDQ  D:  06/08/2005  T:  06/08/2005  Job:  [id number]   cc:   Ellwood Dense, M.D.  [address] [place]  [place] [address]  Fax: [phone]   Reinaldo Meeker, M.D.  [address]  [employer]  [place] [address]  Fax: [phone]   Renaye Rakers, M.D.  [phone] [address], [address]  [place]  [place] [address]  Fax: [phone]

## 2011-03-26 NOTE — Op Note (Signed)
Tammy Salinas, Tammy Salinas                ACCOUNT NO.:  [id number]   MEDICAL RECORD NO.:  [id number]          PATIENT TYPE:  INP   LOCATION:  2550                         FACILITY:  [facility]   PHYSICIAN:  Reinaldo Meeker, M.D. DATE OF BIRTH:  [birth year]   DATE OF PROCEDURE:  05/25/2005  DATE OF DISCHARGE:                                 OPERATIVE REPORT   PREOPERATIVE DIAGNOSIS:  Spinal stenosis and spinal cord contusion C3-4.   POSTOPERATIVE DIAGNOSIS:  Spinal stenosis and spinal cord contusion C3-4.   PROCEDURE:  C3-4 anterior cervical discectomy with bone bank fusion followed  by Zephyr anterior cervical plating.   SURGEON:  Reinaldo Meeker, M.D.   ASSISTANT:  Tia Alert, M.D.   DESCRIPTION OF PROCEDURE:  After being placed in the supine position, 5  pounds Halter traction, the patient's neck was prepped and draped in the  usual sterile fashion, localizing fluoroscopy was used prior to incision to  identify the appropriate level.  Transverse incision was made in the right  anterior neck starting at the midline, heading toward the medial aspect of  the sternocleidomastoid muscle.  The platysma muscle was incised  transversely.  The natural fascial planes from the strap muscles medially,  sternocleidomastoid laterally which was identified, followed down to the  anterior aspect of the cervical spine.  Longus colli muscles were  identified, split in the midline, stripped away bilaterally with the Kitner  dissector and unipolar coagulation.  Self-retaining retractor was placed for  exposure.  This showed approach at the C3-4 level.  Using the #15 bleed, the  disc at C3-4 was incised.  Using pituitary rongeurs and curettes,  approximately __________ disc material was removed.  High speed drill was  used to widen the interspace.  Microscope was draped, brought into the field  and used through the remainder of the case.  Using microdissection  technique, the remainder of the disc  material down to posterior longitudinal  ligament was removed.  Ligament was incised transversely and the cut edge  removed with Kerrison punch.  Bony overgrowth was removed until the spinal  cord was well decompressed as well as the proximal foramen bilaterally.  At  this point, inspection was carried out in all directions for any evidence of  residual compression and none could be identified.  Large amounts of  irrigation was carried out.  Any bleeding controlled with bipolar  coagulation and Gelfoam.  Measurements were taken and a 6 mm bone bank plug  was reconstituted.  After irrigating once more and confirming hemostasis,  the plug was then packed without difficulty.  Fluoroscopy showed to be in  good position.  An appropriate length Zephyr anterior cervical plate was  then chosen.  Under fluoroscopic guidance, drill holes were placed, followed  by placement of 13 mm screws x4.  Locking mechanism was then secured.  Final  fluoroscopy showed the plate, screws and plug to all be in good position.  At this time, large amounts of irrigation were carried  out, any bleeding controlled with bipolar coagulation.  The wound was then  closed with  interrupted Vicryl on the platysma muscle and inverted 5-0 PDS  on the subcuticular layer and Steri-Strips on the skin.  A sterile dressing  and soft collar applied and the patient was extubated and taken to the  recovery room in stable condition.       ROK/MEDQ  D:  05/25/2005  T:  05/26/2005  Job:  [id number]

## 2011-03-26 NOTE — Discharge Summary (Signed)
[name], Tammy Salinas NO.:  [id number]   MEDICAL RECORD NO.:  [id number]          PATIENT TYPE:  INP   LOCATION:                               FACILITY:  [facility]   PHYSICIAN:  Myrtie Neither, MD      DATE OF BIRTH:  09/13/1933   DATE OF ADMISSION:  05/21/2005  DATE OF DISCHARGE:  05/27/2005                                 DISCHARGE SUMMARY   ADMISSION DIAGNOSES:  1.  Extreme weakness both lower and upper extremities.  2.  Cervical spinal stenosis.  3.  Degenerative disc disease cervical and lumbar spine.  4.  Rule out rheumatoid disease.  5.  Rule out neuromuscular disease.  6.  Rule out mitotic disease.   DISCHARGE DIAGNOSES:  Severe cervical spinal stenosis with degenerative disc  disease, herniated nucleus pulposus cervical spine, lumbar degenerative disc  disease.   COMPLICATIONS:  None.   INFECTIONS:  None.   OPERATION:  None.   PERTINENT HISTORY:  This is a 77 year old female who has been followed in  the office for degenerative joint disease of bilateral knees.  Patient had  begun to develop problems with inability to stand and walk in the early part  of June and had noticed progressive weakness of both upper and lower  extremities.  Patient was seen in the office on the day of admission because  of these symptoms and was admitted for evaluation and treatment.   PERTINENT PHYSICAL EXAMINATION:  Cervical spine:  Cervical tenderness and  positive compression test demonstrating pain to both shoulders.  Patient had  weak grip pinch in both hands.  Appeared to have some dysplastic skin  changes involving both fingers, positive Tinel's in both hands with  bilateral wrists hyperesthesia, fingers of both hands.  Right shoulder  tender anterolateral with movement, abduction and full extension.  Left  shoulder nontender to palpation but muscle weakness in the proximal muscle  group.  Patient demonstrated tenderness in the proximal muscles of both  shoulders  as well as both lower extremities.  Deep tendon reflexes were  brisk with triceps, biceps and brachial radialis.   Lumbar spine nontender to palpation.  Straight leg raising negative.  __________ negative.  Extensor hallicis bilaterally weak.  Deep tendon  reflexes both patella and Achilles brisk bilaterally.  Negative Homan's test  bilateral knees, +2 effusion with mildly increased warmth and crepitus in  medial and lateral compartments both knees, weak extension of both  quadriceps and hip extensors.   HOSPITAL COURSE:  Patient was admitted to [facility] and started  on IV Decadron, muscle relaxers and pain medication.  Patient was scheduled  for MRI of the cervical spine and lumbar spine.  Patient's skin demonstrated  severe stenosis with cord compression of cervical spine and degenerative  disc changes in the lumbar  spine.  Neurosurgical consultation was obtained and Dr. Gerlene Fee saw the  patient and had the patient transferred immediately to [facility] and scheduled for cervical spine discectomy and fusion.  Patient was discharged to [facility] in stable condition to  have  cervical decompression.      Myrtie Neither, MD  Electronically Signed     AC/MEDQ  D:  07/23/2005  T:  07/23/2005  Job:  [id number]

## 2011-04-12 NOTE — Assessment & Plan Note (Addendum)
Stable. See Medical Hx for review of issues. Will continue to follow.

## 2011-04-30 ENCOUNTER — Non-Acute Institutional Stay: Payer: Self-pay | Admitting: Family Medicine

## 2011-04-30 DIAGNOSIS — M4712 Other spondylosis with myelopathy, cervical region: Secondary | ICD-10-CM

## 2011-04-30 DIAGNOSIS — F0390 Unspecified dementia without behavioral disturbance: Secondary | ICD-10-CM

## 2011-04-30 DIAGNOSIS — M130 Polyarthritis, unspecified: Secondary | ICD-10-CM

## 2011-05-02 ENCOUNTER — Encounter: Payer: Self-pay | Admitting: Family Medicine

## 2011-05-02 NOTE — Assessment & Plan Note (Signed)
Causing weak upper extremities. Wish to prevent compression neuropathy from arms hanging over, so OT consult for arm supports was ordered

## 2011-05-02 NOTE — Assessment & Plan Note (Signed)
Gradually less interactive

## 2011-05-02 NOTE — Progress Notes (Signed)
  Subjective:    [name], female    DOB: 1933/03/27, 77 y.o.   MRN: [id number]  HPIShe says that the R wrist is still sore. Her son, who is visiting, is concerned that her size causes her arms, particularly the right one, to hang over the sides of the Stoy chair. Otherwise he is happy that she has been stable    Review of Systems     Objective:   Physical ExamAlert, lying semi-reclined in geri chair. Makes eye contact and brief, but appropriate responses to questions. Functionally quadriparetic. Her arms due tend to slide laterally off the arms of the chair. Her right wrist is not red or swollen, and seems to be minimally tender. Heart RR without murmur. Abdomen obese, soft, non-tender. Lower midline surgical scar. Ankles trace edema        Assessment & Plan:

## 2011-05-02 NOTE — Assessment & Plan Note (Signed)
Right wrist doesn't seem to have more than DJD level arthritis currently. Disuse makes the arthralgias worse.

## 2011-05-19 ENCOUNTER — Encounter: Payer: Self-pay | Admitting: Pharmacist

## 2011-05-28 ENCOUNTER — Encounter: Payer: Self-pay | Admitting: Pharmacist

## 2011-05-31 ENCOUNTER — Non-Acute Institutional Stay: Payer: Self-pay | Admitting: Family Medicine

## 2011-05-31 DIAGNOSIS — N19 Unspecified kidney failure: Secondary | ICD-10-CM

## 2011-05-31 DIAGNOSIS — M109 Gout, unspecified: Secondary | ICD-10-CM

## 2011-05-31 DIAGNOSIS — F32A Depression, unspecified: Secondary | ICD-10-CM

## 2011-05-31 DIAGNOSIS — IMO0002 Reserved for concepts with insufficient information to code with codable children: Secondary | ICD-10-CM

## 2011-05-31 DIAGNOSIS — F0390 Unspecified dementia without behavioral disturbance: Secondary | ICD-10-CM

## 2011-05-31 DIAGNOSIS — F323 Major depressive disorder, single episode, severe with psychotic features: Secondary | ICD-10-CM

## 2011-05-31 DIAGNOSIS — E669 Obesity, unspecified: Secondary | ICD-10-CM

## 2011-05-31 DIAGNOSIS — E039 Hypothyroidism, unspecified: Secondary | ICD-10-CM

## 2011-05-31 DIAGNOSIS — M130 Polyarthritis, unspecified: Secondary | ICD-10-CM

## 2011-05-31 DIAGNOSIS — E559 Vitamin D deficiency, unspecified: Secondary | ICD-10-CM

## 2011-05-31 DIAGNOSIS — G894 Chronic pain syndrome: Secondary | ICD-10-CM

## 2011-05-31 DIAGNOSIS — E785 Hyperlipidemia, unspecified: Secondary | ICD-10-CM

## 2011-05-31 DIAGNOSIS — I10 Essential (primary) hypertension: Secondary | ICD-10-CM

## 2011-05-31 DIAGNOSIS — M25539 Pain in unspecified wrist: Secondary | ICD-10-CM

## 2011-05-31 DIAGNOSIS — M1710 Unilateral primary osteoarthritis, unspecified knee: Secondary | ICD-10-CM

## 2011-06-03 ENCOUNTER — Encounter: Payer: Self-pay | Admitting: Family Medicine

## 2011-06-03 NOTE — Assessment & Plan Note (Signed)
Continues complaining of right wrist pain.  No physical exam changes, will continue to treat as arthritis.

## 2011-06-03 NOTE — Assessment & Plan Note (Signed)
Stable. Continues having pain but does not appear to be worsening.  Continue current txt.

## 2011-06-03 NOTE — Assessment & Plan Note (Signed)
Pt with slight weight loss (6 lb) over past month.  Will continue monitoring weights and controlling related diseases.

## 2011-06-03 NOTE — Assessment & Plan Note (Signed)
Chronic, stable. Continue general pain mgmt.

## 2011-06-03 NOTE — Assessment & Plan Note (Signed)
Cr 1.09 07/2011.  Continue to monitor. No acute changes.

## 2011-06-03 NOTE — Assessment & Plan Note (Signed)
BPs stable. Continue norvasc.

## 2011-06-03 NOTE — Assessment & Plan Note (Signed)
Stable. Continue vitamin D2 replacement

## 2011-06-03 NOTE — Assessment & Plan Note (Signed)
Continue lipitor.  Consider rechecking FLP next month, although unlikely to change mgmt so will d/w son closer to lab draw time.

## 2011-06-03 NOTE — Assessment & Plan Note (Signed)
TSH WNL ~1 yr ago, no symptoms therefore no reason to check labs.  Continue current dose of synthroid.

## 2011-06-03 NOTE — Progress Notes (Signed)
Subjective: Pt states she is doing "ok."  Still having pain in her right wrist, although this appears to be a chronic problem.  Pt with fatigue, but no other s/s of hypothyroidism.  Mood has been stable. No recent falls.  Chronic Issues:  1. Hemorrhoids: Rx Miralax, Tucks, Hydrocortisone.   2. Schizophrenia / Dementia: Rx Seroquel, Aricept, Cymbalta.  Psych previously reccommended decreasing Cymbalta and Trazodone and starting Klonopin. We would like to avoid benzos, so are holding on this recommendation.   3. Chronic cystitis: Pt with Hx multiple UTIs. Followed by [facility]. Do not get UA unless systemic symptoms.   4. Chronic Pain: Dx Gout, Back Pain, Hx PMR - Continues to c/o pain chronically. On Tylenol, Gabapentin, and Oxycontin. Weaned from prednisone with no change in function. NOTE: PATIENT HAS CHRONIC RIGHT HAND PAIN.This is thought to be 2/2 to Cervical Spondylosis with myelopathy. Dr. Sheffield Slider suspects this is the main cause of her quadriparetic state. She is not a surgical candidate due to prolonged myelopathy.  5. L femur frx: Pt with fall 4/409 with L femur frx; s/p ORIF 02/14/08 by Dr Shon Baton. No longer on coumadin. Repeat xray of leg per ortho showed separation at repair site. Pt no longer ambulatory. Rx ASA, Calcium, Vitamin D, Bisphosphonate.   6. Hypothyroidism: Rx Synthroid.   7. HTN: Rx Norvasc. BPs stable.   8. Nutrition/: Rx: Vitamin C, Beneprotein, Med Pass supplements, ST, dysphagia diet.   9. HLD: Rx Lipitor. Trig 220. Chol 131. HDL 35. LDL 52.   10. Dyspepsia: Rx Omeprazole.   11. CKD: Unchanged. Creatinine 1.15 (01/21/11).   12. Optho: Nuclear sclerosis both eyes, glaucoma. Recheck q 6 months.   13. NOTES: Patient has [facility]. DNR/DNI.   Review of Systems  SEE HPI.    Objective: Vitals: wt: 231 lb (6/1) --> 225 lb (7/3) Temp: 98.1 (7/21); afebrile x 2 months BP: 121/70 (6/30); 110/70 (7/14); 140/80 (7/21) HR: 74 (7/21); 71-78 x 2 months RR:  19-20  Gen: NAD, laying in bed finishing breakfast, appropriately interactive, somewhat tired appearing HEENT: MMM, EOMI CV: RRR, no murmurs Pulm: normal respiratory effort, breath sounds normal bilat Abd: soft, NT, obese Ext: no lower ext edema; no redness or swelling in right wrist, full ROM  A/P: 77 yo F w/ multiple medical problems residing in nursing home -see problem list

## 2011-06-03 NOTE — Assessment & Plan Note (Signed)
Appears to be stable.  Will d/w son what his thoughts are.

## 2011-06-03 NOTE — Assessment & Plan Note (Signed)
Stable. Pt not complaining of recent flare. Continue colchicine and allopurinol.

## 2011-06-03 NOTE — Assessment & Plan Note (Signed)
Appears to be stable.  Continue current therapy.

## 2011-06-03 NOTE — Assessment & Plan Note (Signed)
Stable. Continue pain mgmt.

## 2011-06-05 ENCOUNTER — Encounter: Payer: Self-pay | Admitting: Family Medicine

## 2011-06-16 ENCOUNTER — Non-Acute Institutional Stay: Payer: Self-pay | Admitting: Family Medicine

## 2011-06-16 DIAGNOSIS — I10 Essential (primary) hypertension: Secondary | ICD-10-CM

## 2011-06-16 DIAGNOSIS — M25539 Pain in unspecified wrist: Secondary | ICD-10-CM

## 2011-06-16 DIAGNOSIS — M4712 Other spondylosis with myelopathy, cervical region: Secondary | ICD-10-CM

## 2011-06-16 DIAGNOSIS — F0390 Unspecified dementia without behavioral disturbance: Secondary | ICD-10-CM

## 2011-06-16 NOTE — Assessment & Plan Note (Signed)
Contributes to her RUE weakness and pain

## 2011-06-16 NOTE — Progress Notes (Signed)
  Subjective:    [name], female    DOB: 1932-12-02, 77 y.o.   MRN: [id number]  HPIcomplains of right wrist pain. Her neck is also stiff and sore. Says her legs hurt when she walks (she hasn't walked for years)    Review of Systems     Objective:   Physical ExamR wrist is swollen dorsally. She can't extend it or spread fingers. R SCM muscle is tight and it hurts her to turn to the right.  Chest clear Cor RR LE 1+ edema Abd soft, obese        Assessment & Plan:

## 2011-06-16 NOTE — Assessment & Plan Note (Signed)
DJD and pseudogout contribute as does cervical radiculopathy. Will try Lidoderm patch

## 2011-06-16 NOTE — Assessment & Plan Note (Signed)
Not well controlled

## 2011-06-16 NOTE — Assessment & Plan Note (Signed)
Continued slow decline

## 2011-06-17 MED ORDER — LIDOCAINE 5 % EX PTCH
1.0000 | MEDICATED_PATCH | CUTANEOUS | Status: DC
Start: 2011-06-16 — End: 2012-05-18

## 2011-06-17 NOTE — Progress Notes (Signed)
Addended by: Zachery Dauer on: 06/17/2011 09:50 AM   Modules accepted: Orders

## 2011-06-30 ENCOUNTER — Encounter: Payer: Self-pay | Admitting: Pharmacist

## 2011-06-30 ENCOUNTER — Telehealth: Payer: Self-pay | Admitting: Family Medicine

## 2011-06-30 ENCOUNTER — Encounter: Payer: Self-pay | Admitting: Family Medicine

## 2011-06-30 DIAGNOSIS — F323 Major depressive disorder, single episode, severe with psychotic features: Secondary | ICD-10-CM

## 2011-06-30 NOTE — Telephone Encounter (Signed)
Consulting psychiatry [facility] recommended increased Cymbalta and making Trazadone to prn, and this was ordered

## 2011-06-30 NOTE — Assessment & Plan Note (Addendum)
[facility] consult recommends increases Cymbalta and change Tramadol to prn and these changes were made

## 2011-07-09 ENCOUNTER — Encounter: Payer: Self-pay | Admitting: Pharmacist

## 2011-07-09 DIAGNOSIS — G894 Chronic pain syndrome: Secondary | ICD-10-CM

## 2011-07-09 DIAGNOSIS — E559 Vitamin D deficiency, unspecified: Secondary | ICD-10-CM

## 2011-07-09 DIAGNOSIS — E785 Hyperlipidemia, unspecified: Secondary | ICD-10-CM

## 2011-07-26 ENCOUNTER — Non-Acute Institutional Stay: Payer: Self-pay | Admitting: Family Medicine

## 2011-07-26 DIAGNOSIS — N19 Unspecified kidney failure: Secondary | ICD-10-CM

## 2011-07-26 DIAGNOSIS — M109 Gout, unspecified: Secondary | ICD-10-CM

## 2011-07-26 DIAGNOSIS — E039 Hypothyroidism, unspecified: Secondary | ICD-10-CM

## 2011-07-26 DIAGNOSIS — F0390 Unspecified dementia without behavioral disturbance: Secondary | ICD-10-CM

## 2011-07-26 DIAGNOSIS — I10 Essential (primary) hypertension: Secondary | ICD-10-CM

## 2011-07-26 DIAGNOSIS — E785 Hyperlipidemia, unspecified: Secondary | ICD-10-CM

## 2011-07-26 DIAGNOSIS — M25539 Pain in unspecified wrist: Secondary | ICD-10-CM

## 2011-07-26 DIAGNOSIS — E669 Obesity, unspecified: Secondary | ICD-10-CM

## 2011-07-26 DIAGNOSIS — E559 Vitamin D deficiency, unspecified: Secondary | ICD-10-CM

## 2011-07-26 DIAGNOSIS — G894 Chronic pain syndrome: Secondary | ICD-10-CM

## 2011-07-26 DIAGNOSIS — F323 Major depressive disorder, single episode, severe with psychotic features: Secondary | ICD-10-CM

## 2011-07-28 ENCOUNTER — Encounter: Payer: Self-pay | Admitting: Family Medicine

## 2011-07-28 NOTE — Assessment & Plan Note (Signed)
Gabapentin increased to help with pain.

## 2011-07-28 NOTE — Progress Notes (Signed)
Subjective: Pt states she is doing "ok."  Still having pain in her right wrist, chronic problem. Mood has been stable. No recent falls.  Alert and interactive.  Unsure of orientation questions but able to answer if she is having pain.  Currently, R wrist is bothering her. No CP, SOB, abd pain, N/V/D.  Chronic Issues:  1. Hemorrhoids: Rx Miralax, Tucks, Hydrocortisone.   2. Schizophrenia / Dementia: Rx Seroquel, Aricept, Cymbalta.  Psych previously reccommended decreasing Cymbalta and Trazodone and starting Klonopin. Would like to avoid benzos, so are holding on this recommendation.   3. Chronic cystitis: Pt with Hx multiple UTIs. Followed by [facility]. Do not get UA unless systemic symptoms.   4. Chronic Pain: Dx Gout, Back Pain, Osteoarthritis, Hx PMR - Continues to c/o pain chronically. On Tylenol, Gabapentin, and Oxycontin.  Also started on lidoderm patch 5%. Weaned from prednisone with no change in function. Increasing gabapentin this visit to see if this improves pain control. NOTE: PATIENT HAS CHRONIC RIGHT HAND PAIN.This is thought to be 2/2 to Cervical Spondylosis with myelopathy. Dr. Sheffield Slider suspects this is the main cause of her quadriparetic state. She is not a surgical candidate due to prolonged myelopathy.  5. L femur frx: Pt with fall 02/10/08 with L femur frx; s/p ORIF 02/14/08 by Dr Shon Baton. No longer on coumadin. Repeat xray of leg per ortho showed separation at repair site. Pt no longer ambulatory. Rx ASA, Calcium, Vitamin D, Bisphosphonate.   6. Hypothyroidism: Rx Synthroid.   7. HTN: Rx Norvasc. BPs stable.   8. Nutrition/: Rx: Vitamin C, Beneprotein, Med Pass supplements, ST, dysphagia diet.   9. HLD: Rx Lipitor. Trig 220. Chol 131. HDL 35. LDL 52.   10. Dyspepsia: Rx Omeprazole.   11. CKD: Unchanged. Creatinine 1.15 (01/21/11).   12. Optho: Nuclear sclerosis both eyes, glaucoma. Recheck q 6 months.   13. NOTES: Patient has [facility]. DNR/DNI.   Review of Systems    SEE HPI.    Objective: Vitals: wt:  225 lb (8/1) --> 228 (07/12/11) Temp: afebrile, 97.2 BP: 100-137/56-82 HR: 60-83 RR: 17-22  Gen: NAD, sitting in chair, finishing dinner, appropriately interactive, somewhat tired appearing HEENT: MMM, EOMI CV: RRR, no murmurs Pulm: normal respiratory effort, breath sounds normal bilat Abd: soft, NT, obese Ext: no lower ext edema; no redness or swelling in right wrist, full ROM, + finger/toe mvmt bilat; TTP right wrist, bilateral LE  MMSE: 8  A/P: 77 yo F w/ multiple medical problems residing in nursing home -increased gabapentin -see problem list

## 2011-08-03 ENCOUNTER — Encounter: Payer: Self-pay | Admitting: Family Medicine

## 2011-08-03 LAB — CBC
HCT: 29.3 — ABNORMAL LOW
HCT: 29.3 — ABNORMAL LOW
HCT: 31 — ABNORMAL LOW
HCT: 45
Hemoglobin: 10.2 — ABNORMAL LOW
Hemoglobin: 14.2
Hemoglobin: 15.2 — ABNORMAL HIGH
Hemoglobin: 9.7 — ABNORMAL LOW
MCHC: 32.8
MCHC: 33
MCHC: 33
MCHC: 33.3
MCHC: 33.8
MCV: 90.4
MCV: 90.5
MCV: 91.3
MCV: 91.7
Platelets: 132 — ABNORMAL LOW
Platelets: 155
Platelets: 181
Platelets: 194
RBC: 3.39 — ABNORMAL LOW
RBC: 4.98
RDW: 17.5 — ABNORMAL HIGH
RDW: 17.7 — ABNORMAL HIGH
RDW: 18.5 — ABNORMAL HIGH
WBC: 10
WBC: 8.9

## 2011-08-03 LAB — URINE MICROSCOPIC-ADD ON

## 2011-08-03 LAB — COMPREHENSIVE METABOLIC PANEL WITH GFR
AST: 22
CO2: 27
Chloride: 103
Creatinine, Ser: 1.3 — ABNORMAL HIGH
GFR calc Af Amer: 49 — ABNORMAL LOW
GFR calc non Af Amer: 40 — ABNORMAL LOW
Glucose, Bld: 135 — ABNORMAL HIGH
Total Bilirubin: 0.7

## 2011-08-03 LAB — BASIC METABOLIC PANEL
BUN: 20
BUN: 20
BUN: 26 — ABNORMAL HIGH
BUN: 27 — ABNORMAL HIGH
CO2: 25
CO2: 25
CO2: 27
CO2: 28
CO2: 29
CO2: 29
Calcium: 10
Calcium: 10.7 — ABNORMAL HIGH
Calcium: 9.7
Chloride: 100
Chloride: 101
Chloride: 102
Chloride: 102
Chloride: 103
Creatinine, Ser: 1.12
Creatinine, Ser: 1.25 — ABNORMAL HIGH
Creatinine, Ser: 1.5 — ABNORMAL HIGH
GFR calc Af Amer: 49 — ABNORMAL LOW
GFR calc Af Amer: 54 — ABNORMAL LOW
GFR calc non Af Amer: 42 — ABNORMAL LOW
Glucose, Bld: 105 — ABNORMAL HIGH
Glucose, Bld: 89
Glucose, Bld: 92
Glucose, Bld: 95
Potassium: 4.4
Potassium: 4.7
Sodium: 136
Sodium: 138
Sodium: 139

## 2011-08-03 LAB — URINALYSIS, ROUTINE W REFLEX MICROSCOPIC
Bilirubin Urine: NEGATIVE
Glucose, UA: NEGATIVE
Hgb urine dipstick: NEGATIVE
Ketones, ur: NEGATIVE
Nitrite: NEGATIVE
Nitrite: POSITIVE — AB
Protein, ur: NEGATIVE
Specific Gravity, Urine: 1.011
Specific Gravity, Urine: 1.022
Urobilinogen, UA: 0.2
Urobilinogen, UA: 1
pH: 6.5
pH: 7.5

## 2011-08-03 LAB — DIFFERENTIAL
Basophils Absolute: 0
Basophils Relative: 0
Eosinophils Absolute: 0
Eosinophils Relative: 0
Lymphocytes Relative: 8 — ABNORMAL LOW
Lymphs Abs: 0.7
Monocytes Absolute: 0.3
Monocytes Relative: 4
Neutro Abs: 7.8 — ABNORMAL HIGH
Neutrophils Relative %: 88 — ABNORMAL HIGH

## 2011-08-03 LAB — PROTIME-INR
INR: 0.9
INR: 1.4
Prothrombin Time: 12.7
Prothrombin Time: 14.2
Prothrombin Time: 31 — ABNORMAL HIGH

## 2011-08-03 LAB — COMPREHENSIVE METABOLIC PANEL
ALT: 19
Albumin: 3.7
Alkaline Phosphatase: 61
BUN: 23
Calcium: 10.3
Potassium: 3.9
Sodium: 140
Total Protein: 7

## 2011-08-03 LAB — SAMPLE TO BLOOD BANK

## 2011-08-03 LAB — APTT: aPTT: 25

## 2011-08-03 LAB — URINE CULTURE: Special Requests: NEGATIVE

## 2011-08-03 LAB — HEMOGLOBIN AND HEMATOCRIT, BLOOD: HCT: 31 — ABNORMAL LOW

## 2011-08-03 NOTE — Assessment & Plan Note (Signed)
BPs appear better controlled- 100-136/56-81. Cont norvasc, ASA

## 2011-08-03 NOTE — Assessment & Plan Note (Signed)
Continue Cymbalta and tramadol PRN, cont seroquel.

## 2011-08-03 NOTE — Assessment & Plan Note (Signed)
Continues w/ R wrist pain.  Will increase gabapentin, continue oxycontin, tylenol, lidoderm patch.

## 2011-08-03 NOTE — Assessment & Plan Note (Signed)
Continue vit D replacement.  

## 2011-08-03 NOTE — Assessment & Plan Note (Signed)
-  Continue colchicine and allopurinol. 

## 2011-08-03 NOTE — Assessment & Plan Note (Signed)
BMI 34. Continue monitoring and controlling related diseases.

## 2011-08-03 NOTE — Assessment & Plan Note (Signed)
Continue lipitor  ?

## 2011-08-03 NOTE — Assessment & Plan Note (Signed)
Continue synthriod.

## 2011-08-03 NOTE — Assessment & Plan Note (Signed)
MMSE score 8. Pt on aricept although unsure at this point how much it is doing. Pt continues to decline.

## 2011-08-13 ENCOUNTER — Other Ambulatory Visit: Payer: Self-pay | Admitting: Family Medicine

## 2011-08-13 DIAGNOSIS — M549 Dorsalgia, unspecified: Secondary | ICD-10-CM

## 2011-08-13 DIAGNOSIS — G894 Chronic pain syndrome: Secondary | ICD-10-CM

## 2011-08-13 MED ORDER — OXYCODONE HCL 10 MG PO TB12: 10.0000 mg | ORAL_TABLET | Freq: Two times a day (BID) | ORAL | Status: DC

## 2011-08-13 NOTE — Telephone Encounter (Signed)
Printed to fax to [employer]

## 2011-08-24 LAB — BASIC METABOLIC PANEL
BUN: 22
CO2: 28
Calcium: 10.1
Calcium: 10.3
Chloride: 102
Creatinine, Ser: 1.06
Creatinine, Ser: 1.43 — ABNORMAL HIGH
GFR calc Af Amer: 44 — ABNORMAL LOW
GFR calc Af Amer: >60
Sodium: 138

## 2011-08-24 LAB — CBC
Hemoglobin: 11.2 — ABNORMAL LOW
MCHC: 32.9
MCV: 86.9
MCV: 87.1
MCV: 87.8
Platelets: 348
RBC: 3.47 — ABNORMAL LOW
RBC: 3.61 — ABNORMAL LOW
RBC: 3.89
WBC: 12.7 — ABNORMAL HIGH
WBC: 6.9
WBC: 7.9

## 2011-08-24 LAB — URINALYSIS, ROUTINE W REFLEX MICROSCOPIC
Hgb urine dipstick: NEGATIVE
Ketones, ur: NEGATIVE
Leukocytes, UA: NEGATIVE
Protein, ur: NEGATIVE
Urobilinogen, UA: 0.2

## 2011-08-24 LAB — COMPREHENSIVE METABOLIC PANEL
ALT: 15
AST: 14
Albumin: 2.3 — ABNORMAL LOW
Alkaline Phosphatase: 63
CO2: 26
Chloride: 108
Creatinine, Ser: 1.06
GFR calc Af Amer: >60
GFR calc non Af Amer: 51 — ABNORMAL LOW
Potassium: 3.8
Sodium: 139
Total Bilirubin: 0.6

## 2011-08-24 LAB — DIFFERENTIAL
Basophils Absolute: 0
Basophils Relative: 1
Eosinophils Absolute: 0.1
Eosinophils Relative: 1
Monocytes Absolute: 0.8 — ABNORMAL HIGH

## 2011-08-24 LAB — URINE CULTURE: Colony Count: >=100000

## 2011-08-24 LAB — URINE MICROSCOPIC-ADD ON

## 2011-08-25 ENCOUNTER — Non-Acute Institutional Stay: Payer: Self-pay | Admitting: Family Medicine

## 2011-08-25 DIAGNOSIS — M4712 Other spondylosis with myelopathy, cervical region: Secondary | ICD-10-CM

## 2011-08-25 DIAGNOSIS — F0390 Unspecified dementia without behavioral disturbance: Secondary | ICD-10-CM

## 2011-08-25 DIAGNOSIS — M25539 Pain in unspecified wrist: Secondary | ICD-10-CM

## 2011-08-25 LAB — CBC
HCT: 25.1 — ABNORMAL LOW
HCT: 31.7 — ABNORMAL LOW
HCT: 34.1 — ABNORMAL LOW
HCT: 37.7
HCT: 38.4
Hemoglobin: 11 — ABNORMAL LOW
Hemoglobin: 8.5 — ABNORMAL LOW
MCHC: 33.3
MCHC: 33.7
MCHC: 33.7
MCHC: 34
MCV: 85.8
MCV: 86.3
MCV: 86.4
MCV: 87.7
MCV: 88.4
Platelets: 173
Platelets: 283
Platelets: 288
Platelets: 306
RBC: 2.91 — ABNORMAL LOW
RBC: 3.81 — ABNORMAL LOW
RBC: 3.85 — ABNORMAL LOW
RBC: 4.27
RBC: 4.38
RDW: 15.6 — ABNORMAL HIGH
WBC: 11.1 — ABNORMAL HIGH
WBC: 11.4 — ABNORMAL HIGH
WBC: 21.9 — ABNORMAL HIGH
WBC: 8.2
WBC: 8.7
WBC: 9.9

## 2011-08-25 LAB — BASIC METABOLIC PANEL
BUN: 23
BUN: 24 — ABNORMAL HIGH
BUN: 25 — ABNORMAL HIGH
BUN: 26 — ABNORMAL HIGH
CO2: 20
CO2: 23
CO2: 23
CO2: 23
CO2: 23
Calcium: 10.7 — ABNORMAL HIGH
Calcium: 9.6
Calcium: 9.9
Chloride: 109
Chloride: 110
Chloride: 110
Chloride: 111
Creatinine, Ser: 0.99
Creatinine, Ser: 1.11
GFR calc Af Amer: >60
GFR calc Af Amer: >60
GFR calc non Af Amer: 49 — ABNORMAL LOW
GFR calc non Af Amer: 52 — ABNORMAL LOW
GFR calc non Af Amer: 55 — ABNORMAL LOW
GFR calc non Af Amer: >60
Glucose, Bld: 116 — ABNORMAL HIGH
Glucose, Bld: 89
Glucose, Bld: 95
Potassium: 3.8
Potassium: 4
Potassium: 4.2
Potassium: 4.2
Sodium: 139
Sodium: 139
Sodium: 140
Sodium: 140

## 2011-08-25 LAB — PHOSPHORUS: Phosphorus: 4.1

## 2011-08-25 LAB — POCT CARDIAC MARKERS
CKMB, poc: 1.2
Myoglobin, poc: 200
Operator id: 285841

## 2011-08-25 LAB — UIFE/LIGHT CHAINS/TP QN, 24-HR UR
Alpha 2, Urine: DETECTED — AB
Free Kappa Lt Chains,Ur: 1.7 — ABNORMAL HIGH (ref 0.04–1.51)
Free Kappa/Lambda Ratio: 10.63 — ABNORMAL HIGH (ref 0.46–4.00)
Free Lambda Excretion/Day: 3.56
Free Lambda Lt Chains,Ur: 0.16 (ref 0.08–1.01)
Free Lt Chn Excr Rate: 37.83
Time: 24
Total Protein, Urine-Ur/day: 49 (ref 10–140)
Total Protein, Urine: 2.2
Volume, Urine: 2225

## 2011-08-25 LAB — COMPREHENSIVE METABOLIC PANEL
ALT: 11
AST: 16
Albumin: 3 — ABNORMAL LOW
Alkaline Phosphatase: 66
BUN: 57 — ABNORMAL HIGH
CO2: 21
CO2: 28
Calcium: 10.1
Calcium: 10.8 — ABNORMAL HIGH
Chloride: 106
Chloride: 107
Chloride: 108
Creatinine, Ser: 1.55 — ABNORMAL HIGH
GFR calc Af Amer: 45 — ABNORMAL LOW
GFR calc non Af Amer: 33 — ABNORMAL LOW
Glucose, Bld: 97
Potassium: 3.9
Potassium: 3.9
Sodium: 139
Sodium: 140
Total Bilirubin: 0.7
Total Bilirubin: 0.9
Total Bilirubin: 1.2
Total Protein: 6.3

## 2011-08-25 LAB — PROTEIN ELECTROPHORESIS, SERUM
Albumin ELP: 52 — ABNORMAL LOW
Beta Globulin: 6.1
M-Spike, %: NOT DETECTED
Total Protein ELP: 7.4

## 2011-08-25 LAB — URINALYSIS, ROUTINE W REFLEX MICROSCOPIC
Bilirubin Urine: NEGATIVE
Glucose, UA: NEGATIVE
Glucose, UA: NEGATIVE
Hgb urine dipstick: NEGATIVE
Ketones, ur: NEGATIVE
Ketones, ur: NEGATIVE
Protein, ur: NEGATIVE
Protein, ur: NEGATIVE
Urobilinogen, UA: 0.2
Urobilinogen, UA: 0.2

## 2011-08-25 LAB — PROTIME-INR
INR: 0.9
Prothrombin Time: 12.7

## 2011-08-25 LAB — APTT
aPTT: 24
aPTT: 28

## 2011-08-25 LAB — DIFFERENTIAL
Basophils Absolute: 0.1
Basophils Relative: 0
Basophils Relative: 0
Eosinophils Absolute: 0
Eosinophils Absolute: 0.2
Eosinophils Relative: 1
Lymphocytes Relative: 21
Lymphs Abs: 1
Lymphs Abs: 1.7
Monocytes Relative: 3
Neutro Abs: 5.9
Neutro Abs: 9.1 — ABNORMAL HIGH
Neutrophils Relative %: 72
Neutrophils Relative %: 80 — ABNORMAL HIGH

## 2011-08-25 LAB — URINE CULTURE

## 2011-08-25 LAB — CK
Total CK: 56
Total CK: 61

## 2011-08-25 LAB — MAGNESIUM: Magnesium: 2.2

## 2011-08-25 LAB — TSH: TSH: 0.76

## 2011-08-25 LAB — LIPID PANEL
Triglycerides: 70
VLDL: 14

## 2011-09-01 NOTE — Progress Notes (Signed)
  Subjective:    [name], female    DOB: Sep 16, 1933, 77 y.o.   MRN: [id number]  HPI Ms.  Salinas was interviewed and examined when as she reported abdominal discomfort after she was saying that she had eaten a good lunch. She says the Lidoderm helps her right wrist but that it still swells.    Review of Systems     Objective:   Physical Exam she was lying in bed pleasantly L. interactive alert  abdomen soft nontender but obese lower midline scar No significant swelling of the right wrist which has the lidoderm patch in place  she has generalized obesity and generalized weakness.         Assessment & Plan:

## 2011-09-06 NOTE — Assessment & Plan Note (Signed)
Advanced dementia, recently stable

## 2011-09-06 NOTE — Assessment & Plan Note (Signed)
Partially the cause of her right arm pain. Presently controlled

## 2011-09-06 NOTE — Assessment & Plan Note (Signed)
Seems to be improved on the lidoderm patch.

## 2011-09-20 ENCOUNTER — Other Ambulatory Visit: Payer: Self-pay | Admitting: Family Medicine

## 2011-09-20 DIAGNOSIS — G894 Chronic pain syndrome: Secondary | ICD-10-CM

## 2011-09-20 DIAGNOSIS — M549 Dorsalgia, unspecified: Secondary | ICD-10-CM

## 2011-09-20 MED ORDER — OXYCODONE HCL 10 MG PO TB12: 10.0000 mg | ORAL_TABLET | Freq: Two times a day (BID) | ORAL | Status: DC

## 2011-09-20 NOTE — Telephone Encounter (Signed)
Written on Rx, attached to fax and returned to [facility]

## 2011-09-21 ENCOUNTER — Non-Acute Institutional Stay: Payer: Self-pay | Admitting: Family Medicine

## 2011-09-21 DIAGNOSIS — E785 Hyperlipidemia, unspecified: Secondary | ICD-10-CM

## 2011-09-21 DIAGNOSIS — M109 Gout, unspecified: Secondary | ICD-10-CM

## 2011-09-21 DIAGNOSIS — F32A Depression, unspecified: Secondary | ICD-10-CM

## 2011-09-21 DIAGNOSIS — F0390 Unspecified dementia without behavioral disturbance: Secondary | ICD-10-CM

## 2011-09-21 DIAGNOSIS — K5900 Constipation, unspecified: Secondary | ICD-10-CM

## 2011-09-21 DIAGNOSIS — G894 Chronic pain syndrome: Secondary | ICD-10-CM

## 2011-09-21 DIAGNOSIS — E669 Obesity, unspecified: Secondary | ICD-10-CM

## 2011-09-21 DIAGNOSIS — E039 Hypothyroidism, unspecified: Secondary | ICD-10-CM

## 2011-09-21 DIAGNOSIS — F323 Major depressive disorder, single episode, severe with psychotic features: Secondary | ICD-10-CM

## 2011-09-21 DIAGNOSIS — M25539 Pain in unspecified wrist: Secondary | ICD-10-CM

## 2011-09-21 DIAGNOSIS — I10 Essential (primary) hypertension: Secondary | ICD-10-CM

## 2011-09-21 DIAGNOSIS — M4712 Other spondylosis with myelopathy, cervical region: Secondary | ICD-10-CM

## 2011-09-21 NOTE — Progress Notes (Signed)
Subjective: Patient overall doing okay. She is currently complaining of left upper quadrant pain. She states it moves occasionally. It is sharp in nature. She is not complaining of any nausea, vomiting, diarrhea, or constipation. She is not having any shortness of breath or chest pains. She overall feels okay. She feels like her pain is somewhat controlled. Pain is worst in her legs than in her arm and at this time. Patient feels that the Lidoderm patches are working.  Patient's son was present during the questioning and examination. He states that she feels like she is doing "okay". She does apparently have some occasional hallucinations, but he does not feel like any medications need to be changed at this time. He appreciated being updated on the plan.  Chronic Issues:  1. Hemorrhoids: Rx Miralax, Tucks, Hydrocortisone.   2. Schizophrenia / Dementia: Rx Seroquel, Aricept, Cymbalta.  Psych previously reccommended decreasing Cymbalta and Trazodone and starting Klonopin. Would like to avoid benzos, so are holding on this recommendation.   3. Chronic cystitis: Pt with Hx multiple UTIs. Followed by [facility]. Do not get UA unless systemic symptoms.   4. Chronic Pain: Dx Gout, Back Pain, Osteoarthritis, Hx PMR - Continues to c/o pain chronically. On Tylenol, Gabapentin, and Oxycontin.  Also started on lidoderm patch 5%. Weaned from prednisone with no change in function. Increasing gabapentin this visit to see if this improves pain control. NOTE: PATIENT HAS CHRONIC RIGHT HAND PAIN.This is thought to be 2/2 to Cervical Spondylosis with myelopathy. Dr. Sheffield Slider suspects this is the main cause of her quadriparetic state. She is not a surgical candidate due to prolonged myelopathy.  5. L femur frx: Pt with fall 02/10/08 with L femur frx; s/p ORIF 02/14/08 by Dr Shon Baton. No longer on coumadin. Repeat xray of leg per ortho showed separation at repair site. Pt no longer ambulatory. Rx ASA, Calcium, Vitamin D,  Bisphosphonate.   6. Hypothyroidism: Rx Synthroid.   7. HTN: Rx Norvasc. BPs stable.   8. Nutrition/: Rx: Vitamin C, Beneprotein, Med Pass supplements, ST, dysphagia diet.   9. HLD: Rx Lipitor. Trig 220. Chol 131. HDL 35. LDL 52.   10. Dyspepsia: Rx Omeprazole.   11. CKD: Unchanged. Baseline Creatinine 1.15 (01/21/11).   12. Optho: Nuclear sclerosis both eyes, glaucoma. Recheck q 6 months.   13. NOTES: Patient has [facility]. DNR/DNI.   Review of Systems  SEE HPI.    Objective: Vitals: wt:  22.8 (09/09/11) <-- 228.4 (10/3 and 07/12/11) On 09/18/11: Temp: afebrile, 97.3 BP: 133/80 (111-149/68-92) HR:  69 (54-79) RR:  18 (18-20)  Gen: NAD, sitting in chair, finishing dinner, appropriately interactive, somewhat tired appearing HEENT: MMM, EOMI CV: RRR, no murmurs Pulm: normal respiratory effort, breath sounds normal bilat Abd: soft, NT, obese Ext: no lower ext edema; no redness or swelling in right wrist, full ROM, + finger/toe mvmt bilat; TTP right wrist, bilateral LE   A/P: 77 yo F w/ multiple medical problems residing in nursing home -see problem list

## 2011-09-21 NOTE — Assessment & Plan Note (Addendum)
FLP from 09/16/11 with cholesterol 127, TG 168, HDL 36, LDL 57.  Question if was drawn fasting.  Continue lipitor, consider recheck in 6 months to 1 year.

## 2011-09-21 NOTE — Assessment & Plan Note (Addendum)
A1c 5.6, last one 05/2011 was 5.1.  Weight: 222, decreased 6lb from 08/11/11 and 07/12/11.  Continue monitoring and controlling related diseases.

## 2011-09-23 NOTE — Assessment & Plan Note (Signed)
Continue standing PRN orders of [facility] followed by enema.

## 2011-09-23 NOTE — Assessment & Plan Note (Signed)
BPs acceptable, ranging mostly 130s/80s.  Continue current norvasc, ASA

## 2011-09-23 NOTE — Assessment & Plan Note (Signed)
Advanced dementia, MMSE 8/30 07/2011, appears stable at this time.

## 2011-09-23 NOTE — Assessment & Plan Note (Signed)
Contributes to her RUE weakness and pain, currently controlled.

## 2011-09-23 NOTE — Assessment & Plan Note (Addendum)
Continue colchicine 0.6mg  and allopurinol.

## 2011-09-23 NOTE — Assessment & Plan Note (Signed)
Continue synthroid.

## 2011-09-23 NOTE — Assessment & Plan Note (Signed)
Continue cymbalta and seroquel, tramadol PRN.

## 2011-09-23 NOTE — Assessment & Plan Note (Signed)
Continue oxycontin, tylenol, lidoderm patch, gabapentin.

## 2011-09-23 NOTE — Assessment & Plan Note (Signed)
Seems better with increased gabapentin and lidoderm patch. No changes at this time.

## 2011-10-15 ENCOUNTER — Encounter: Payer: Self-pay | Admitting: Family Medicine

## 2011-11-01 ENCOUNTER — Telehealth: Payer: Self-pay | Admitting: Family Medicine

## 2011-11-01 NOTE — Telephone Encounter (Signed)
Call from Nichols, NP with [facility], giving courtesy call regarding patient.   Patient had respiratory symptoms (shorntess of breath, decreased saturations) starting on Saturday 12/22. CXR ordered Saturday and shown to have bibasilar infiltrates. Started on CTX 2 g IM x 1 and then Levaquin 750 mg po daily x 7 days. CTX started on 12/23.   Temperature today of 101.8. Started on IVF. Now @ 50 mL/hr. Will SL later in afternoon. Clinically NAD and without dyspnea. Coarse lung sounds bilaterally. Sating okay on RA.  Given Tylenol and prednisone ordered.   Tammy Salinas will be on-call through today.  Asked her to page upper level on-call if any other questions or concerns.

## 2011-11-03 ENCOUNTER — Non-Acute Institutional Stay: Payer: Self-pay | Admitting: Family Medicine

## 2011-11-03 DIAGNOSIS — J440 Chronic obstructive pulmonary disease with acute lower respiratory infection: Secondary | ICD-10-CM

## 2011-11-03 DIAGNOSIS — D649 Anemia, unspecified: Secondary | ICD-10-CM

## 2011-11-03 DIAGNOSIS — M4712 Other spondylosis with myelopathy, cervical region: Secondary | ICD-10-CM

## 2011-11-03 DIAGNOSIS — I10 Essential (primary) hypertension: Secondary | ICD-10-CM

## 2011-11-03 NOTE — Progress Notes (Signed)
Addended by: Zachery Dauer on: 11/03/2011 06:04 PM   Modules accepted: Level of Service

## 2011-11-03 NOTE — Assessment & Plan Note (Signed)
Improving. Treated as pneumonia due to cxr, but these tend to be over read and her WBC was 7200 without a shift.

## 2011-11-03 NOTE — Assessment & Plan Note (Signed)
Causing clonus in legs, but no associated discomfort.

## 2011-11-03 NOTE — Assessment & Plan Note (Signed)
well controlled  

## 2011-11-03 NOTE — Progress Notes (Addendum)
  Subjective:    [name], female    DOB: 07-Mar-1933, 77 y.o.   MRN: [id number]  HPISon says she ate well this morning. No new complaints. She is still coughing since she was treated for pneumonia on December 24. That day she has temperature of 101 and had a chest x-ray that showed a bibasilar infiltrate cell count was 7200without a left shifts. She was treated with Rocephin 2 g IM and 7 days of Levaquin by Donald Prose who followed up on her today.    Review of Systems     Objective:   Physical ExamAlert, reclined in Geri chair Heart RR Chest clear laterally.  Abd soft, non tender Ext wearing bilateral wrist splints. Contracted achilles bilaterally.  Neuro bilateral spontaneous clonus of both legs. Reflexes 4+ knees and ankles        Assessment & Plan:

## 2011-11-15 ENCOUNTER — Other Ambulatory Visit: Payer: Self-pay | Admitting: Family Medicine

## 2011-11-15 DIAGNOSIS — G894 Chronic pain syndrome: Secondary | ICD-10-CM

## 2011-11-15 DIAGNOSIS — M549 Dorsalgia, unspecified: Secondary | ICD-10-CM

## 2011-11-15 MED ORDER — OXYCODONE HCL 10 MG PO TB12: 10.0000 mg | ORAL_TABLET | Freq: Two times a day (BID) | ORAL | Status: DC

## 2011-11-24 ENCOUNTER — Encounter: Payer: Self-pay | Admitting: Family Medicine

## 2011-11-30 ENCOUNTER — Non-Acute Institutional Stay: Payer: Self-pay | Admitting: Family Medicine

## 2011-11-30 ENCOUNTER — Encounter: Payer: Self-pay | Admitting: Family Medicine

## 2011-11-30 DIAGNOSIS — E559 Vitamin D deficiency, unspecified: Secondary | ICD-10-CM

## 2011-11-30 DIAGNOSIS — F323 Major depressive disorder, single episode, severe with psychotic features: Secondary | ICD-10-CM

## 2011-11-30 DIAGNOSIS — J209 Acute bronchitis, unspecified: Secondary | ICD-10-CM

## 2011-11-30 DIAGNOSIS — IMO0002 Reserved for concepts with insufficient information to code with codable children: Secondary | ICD-10-CM

## 2011-11-30 DIAGNOSIS — M25539 Pain in unspecified wrist: Secondary | ICD-10-CM

## 2011-11-30 DIAGNOSIS — G894 Chronic pain syndrome: Secondary | ICD-10-CM

## 2011-11-30 DIAGNOSIS — F32A Depression, unspecified: Secondary | ICD-10-CM

## 2011-11-30 DIAGNOSIS — J440 Chronic obstructive pulmonary disease with acute lower respiratory infection: Secondary | ICD-10-CM

## 2011-11-30 DIAGNOSIS — I10 Essential (primary) hypertension: Secondary | ICD-10-CM

## 2011-11-30 DIAGNOSIS — E785 Hyperlipidemia, unspecified: Secondary | ICD-10-CM

## 2011-11-30 DIAGNOSIS — G4700 Insomnia, unspecified: Secondary | ICD-10-CM

## 2011-11-30 DIAGNOSIS — M1710 Unilateral primary osteoarthritis, unspecified knee: Secondary | ICD-10-CM

## 2011-11-30 DIAGNOSIS — F0390 Unspecified dementia without behavioral disturbance: Secondary | ICD-10-CM

## 2011-11-30 DIAGNOSIS — E039 Hypothyroidism, unspecified: Secondary | ICD-10-CM

## 2011-11-30 DIAGNOSIS — M109 Gout, unspecified: Secondary | ICD-10-CM

## 2011-11-30 DIAGNOSIS — E669 Obesity, unspecified: Secondary | ICD-10-CM

## 2011-11-30 NOTE — Assessment & Plan Note (Signed)
Advanced dementia, continue Aricept although question utility with most recent MMSE 8/30 in 07/2011.

## 2011-11-30 NOTE — Assessment & Plan Note (Signed)
Continue lipitor.  Most recent FLP was 09/16/11 with acceptable chol (127, LDL 57) but elevated TG (168).  Consider recheck in 81mo to 1 year (03/2012-09/2012)

## 2011-11-30 NOTE — Assessment & Plan Note (Signed)
Current pain regimen appears to be working, no oxycodone needed for break through pain in the past 1 month. No changes at this time.

## 2011-11-30 NOTE — Assessment & Plan Note (Signed)
Continue synthroid qd.

## 2011-11-30 NOTE — Assessment & Plan Note (Signed)
Large weight gain in past 1 month, 217 (10/12/11) --> 228 (11/11/11). Continue lipitor for HLD.  A1c in 09/2011 was 5.6 (up from 5.1 in 05/2011).  Continue controlled related diseases.

## 2011-11-30 NOTE — Assessment & Plan Note (Signed)
BPs well controlled. Continue current norvasc, ASA

## 2011-11-30 NOTE — Assessment & Plan Note (Signed)
No complaints unless prompted, continue gabapentin 300 TID, scheduled tylenol 500 TID, oxycontin 10 BID, oxycodone 5 TID PRN (none used in past 1 month), lidoderm patch.  Cymbalta and seroquel may also be helping.

## 2011-11-30 NOTE — Assessment & Plan Note (Signed)
Well controlled on OxyContin, tylenol, gabapentin, lidoderm patch, cymbalta, seroquel. Oxycodone availabel for break through but has not been needed.

## 2011-11-30 NOTE — Assessment & Plan Note (Signed)
Using Trazodone 25mg  QHS + additional 50mg  1 hr later PRN.

## 2011-11-30 NOTE — Assessment & Plan Note (Signed)
Does not appear to be getting vitamin D based on MAR.

## 2011-11-30 NOTE — Assessment & Plan Note (Signed)
Continue cymbalta and seroquel, trazadone qhs and PRN.

## 2011-11-30 NOTE — Progress Notes (Signed)
Subjective: Patient overall doing okay. No complaints of pain.  When asked about pain specifically in her wrist, states it is still there and not any better or worse.  Appear to be confused and was talking about needing to have her picture taken this afternoon and then return in the morning to finish with the picture. Patient was awoken, so this may be contributing to her confusion. Overall however, is not in any significant distress and feels like she is doing okay. No chest pain or shortness of breath, no nausea or vomiting, is not complaining of any new weakness or tingling in her arms or legs.   Chronic Issues:  1. Hemorrhoids: Rx Miralax, Tucks, Hydrocortisone.   2. Schizophrenia / Dementia: Rx Seroquel, Aricept, Cymbalta.  Psych previously reccommended decreasing Cymbalta and Trazodone and starting Klonopin. Would like to avoid benzos, so are holding on this recommendation. MMSE in 07/2011: 8/30.   3. Chronic cystitis: Pt with Hx multiple UTIs. Followed by [facility]. Do not get UA unless systemic symptoms.   4. Chronic Pain: Dx Gout, Back Pain, Osteoarthritis, Hx PMR - Continues to c/o pain chronically but has not needed any PRN Oxycodone 5mg . On Tylenol, Gabapentin, Oxycontin, lidoderm patch 5%. Weaned from prednisone with no change in function. Increased gabapentin to 300 TID with improved pain control and no significant side effects; will continue. NOTE: PATIENT HAS CHRONIC RIGHT HAND PAIN.This is thought to be 2/2 to Cervical Spondylosis with myelopathy. Dr. Sheffield Slider suspects this is the main cause of her quadriparetic state. She is not a surgical candidate due to prolonged myelopathy.  5. L femur frx: Pt with fall 02/10/08 with L femur frx; s/p ORIF 02/14/08 by Dr Shon Baton. No longer on coumadin. Repeat xray of leg per ortho showed separation at repair site. Pt no longer ambulatory. Rx ASA, Calcium, Vitamin D, Bisphosphonate.   6. Hypothyroidism: Rx Synthroid.   7. HTN: Rx Norvasc. BPs  stable.   8. Nutrition/: Rx: Vitamin C, Beneprotein, Med Pass supplements, ST, dysphagia diet.   9. HLD: FLP 09/16/11: cholesterol 127, TG 168, HDL 36, LDL 57. Question if was drawn fasting. Continue lipitor, consider recheck in 6 months to 1 year.  10. Dyspepsia: Rx Omeprazole.   11. CKD: Unchanged. Baseline Creatinine 1.15 (01/21/11).  Most recent Cr 1.09 07/2011.   12. Optho: Nuclear sclerosis both eyes, glaucoma. Recheck q 6 months.   13. NOTES: Patient has [facility]. DNR/DNI.   Review of Systems  SEE HPI.    Objective: Vitals: wt: 228.4 (10/3 and 07/12/11) -->  222.8 (09/09/11)  --> 217 (10/22/11) --> 228 (11/11/11) On 11/27/11: Temp: 96.9; did have fever to 101 on 11/01/11, otherwise afebrile BP: 139/82 (119-147/70-82) HR:  79 (56-93) RR:  19 (16-24)  Gen: NAD, appropriately interactive although confused, somewhat tired appearing, laying in bed HEENT: MMM, EOMI CV: RRR, no murmurs Pulm: normal respiratory effort, breath sounds normal bilat Abd: soft, NT, obese Ext: no lower ext edema; no redness or swelling in right wrist, full ROM, + finger/toe mvmt bilat; left wrist in splint   A/P: 78 yo F w/ multiple medical problems residing in nursing home -see problem list

## 2011-11-30 NOTE — Assessment & Plan Note (Addendum)
Appears to be resolved, s/p prednisone burt 12/24-1/2; lungs clear on exam today. Has been afebrile since fever of 101 on 11/01/11.

## 2011-11-30 NOTE — Assessment & Plan Note (Signed)
No acute flares mentioned.  Continue colcrys 0.6mg  qd and allopurinol 300mg  qd.

## 2011-12-08 ENCOUNTER — Encounter: Payer: Self-pay | Admitting: Family Medicine

## 2011-12-08 ENCOUNTER — Non-Acute Institutional Stay: Payer: Self-pay | Admitting: Family Medicine

## 2011-12-10 ENCOUNTER — Other Ambulatory Visit: Payer: Self-pay | Admitting: Family Medicine

## 2011-12-15 ENCOUNTER — Encounter: Payer: Self-pay | Admitting: Pharmacist

## 2011-12-15 DIAGNOSIS — E559 Vitamin D deficiency, unspecified: Secondary | ICD-10-CM

## 2011-12-15 DIAGNOSIS — J440 Chronic obstructive pulmonary disease with acute lower respiratory infection: Secondary | ICD-10-CM

## 2011-12-29 ENCOUNTER — Non-Acute Institutional Stay: Payer: Self-pay | Admitting: Family Medicine

## 2011-12-29 DIAGNOSIS — J449 Chronic obstructive pulmonary disease, unspecified: Secondary | ICD-10-CM | POA: Insufficient documentation

## 2011-12-29 DIAGNOSIS — J440 Chronic obstructive pulmonary disease with acute lower respiratory infection: Secondary | ICD-10-CM

## 2011-12-29 NOTE — Assessment & Plan Note (Signed)
As stated above in the COPD.

## 2011-12-29 NOTE — Progress Notes (Signed)
  Subjective:    [name], female    DOB: Oct 29, 1933, 78 y.o.   MRN: [id number]  HPI 78 year old female with past medical history significant for COPD who has had a recent onset of shortness of breath and wheezing. Patient has been continuing her regular regimen without any improvement. Patient is also followed by [facility] who felt that patient may have a COPD exacerbation. Patient was treated for one not too long ago meaning within the last couple months. Patient was started on prednisone as well as nebulizing treatments. Vital signs were reviewed no sign of fevers at this time. Patient is making some sputum mostly white in color. No change in bowel habits denies abdominal pain denies chest pain.   Review of Systems As stated in history of present illness    Objective:   Physical Exam  Temp: 98.6 BP: 126/86 (119-147/70-82) HR:  85 (60-93) RR:  18 (16-24)  Gen: NAD, appropriately interactive although confused, no labored breathing HEENT: MMM, EOMI CV: RRR, no murmurs Pulm: Patient has expiratory wheezes throughout. No focal findings Abd: soft, NT, obese Ext: no lower ext edema; no redness or swelling in right wrist, full ROM    Assessment & Plan:

## 2011-12-29 NOTE — Assessment & Plan Note (Signed)
Patient has long history of smoking as a like a COPD exacerbation and has been treated like one before with [facility]. Do not think we need to add medications at this time showing no signs of infection or pneumonia. We'll continue current course patient seems to be improving somewhat over one day course.

## 2012-01-17 ENCOUNTER — Non-Acute Institutional Stay: Payer: Self-pay | Admitting: Family Medicine

## 2012-01-17 DIAGNOSIS — D649 Anemia, unspecified: Secondary | ICD-10-CM

## 2012-01-17 DIAGNOSIS — G894 Chronic pain syndrome: Secondary | ICD-10-CM

## 2012-01-17 DIAGNOSIS — E669 Obesity, unspecified: Secondary | ICD-10-CM

## 2012-01-17 DIAGNOSIS — F0390 Unspecified dementia without behavioral disturbance: Secondary | ICD-10-CM

## 2012-01-17 DIAGNOSIS — E785 Hyperlipidemia, unspecified: Secondary | ICD-10-CM

## 2012-01-17 DIAGNOSIS — E039 Hypothyroidism, unspecified: Secondary | ICD-10-CM

## 2012-01-17 DIAGNOSIS — E559 Vitamin D deficiency, unspecified: Secondary | ICD-10-CM

## 2012-01-17 DIAGNOSIS — I10 Essential (primary) hypertension: Secondary | ICD-10-CM

## 2012-01-17 DIAGNOSIS — F323 Major depressive disorder, single episode, severe with psychotic features: Secondary | ICD-10-CM

## 2012-01-17 DIAGNOSIS — J449 Chronic obstructive pulmonary disease, unspecified: Secondary | ICD-10-CM

## 2012-01-17 DIAGNOSIS — N19 Unspecified kidney failure: Secondary | ICD-10-CM

## 2012-01-17 DIAGNOSIS — M25539 Pain in unspecified wrist: Secondary | ICD-10-CM

## 2012-01-17 NOTE — Progress Notes (Signed)
Subjective: Laying in bed, resting comfortably.  Complains of wrists and legs hurting.  Appetite has been good, denies nausea.  No chest pain or shortness or breath.    Had acute bronchitis and was given Solumedrol x1 dose 2/19, also started on advair and allegra at that time.  Pt feels like she is feeling well, without rhinorrhea, cough, difficulty breathing, or increased WOB.    Chronic Issues:  1. Hemorrhoids: Rx Miralax, Tucks, Hydrocortisone.   2. Schizophrenia / Dementia: Rx Seroquel, Aricept, Cymbalta.  Psych previously reccommended decreasing Cymbalta and Trazodone and starting Klonopin. Would like to avoid benzos, so are holding on this recommendation. MMSE in 07/2011: 8/30.   3. Chronic cystitis: Pt with Hx multiple UTIs. Followed by [facility]. Do not get UA unless systemic symptoms.   4. Chronic Pain: Dx Gout, Back Pain, Osteoarthritis, Hx PMR - Continues to c/o pain chronically but has not needed any PRN Oxycodone 5mg . On Tylenol, Gabapentin, Oxycontin, lidoderm patch 5%. Weaned from prednisone with no change in function. Increased gabapentin to 300 TID with improved pain control and no significant side effects; will continue. NOTE: PATIENT HAS CHRONIC RIGHT HAND PAIN.This is thought to be 2/2 to Cervical Spondylosis with myelopathy. Dr. Sheffield Slider suspects this is the main cause of her quadriparetic state. She is not a surgical candidate due to prolonged myelopathy.  5. L femur frx: Pt with fall 02/10/08 with L femur frx; s/p ORIF 02/14/08 by Dr Shon Baton. No longer on coumadin. Repeat xray of leg per ortho showed separation at repair site. Pt no longer ambulatory. Rx ASA, Calcium, Vitamin D, Bisphosphonate.   6. Hypothyroidism: Rx Synthroid.   7. HTN: Rx Norvasc. BPs stable.   8. Nutrition/: Rx: Vitamin C, Beneprotein, Med Pass supplements, ST, dysphagia diet.   9. HLD: FLP 09/16/11: cholesterol 127, TG 168, HDL 36, LDL 57. Question if was drawn fasting. Continue lipitor, consider  recheck in 6 months to 1 year.  10. Dyspepsia: Rx Omeprazole.   11. CKD: Unchanged. Baseline Creatinine 1.15 (01/21/11).  Most recent Cr 1.09 07/2011.   12. Optho: Nuclear sclerosis both eyes, glaucoma. Recheck q 6 months.   13. NOTES: Patient has [facility]. DNR/DNI.   Review of Systems  SEE HPI.    Objective:  Vitals: wt: 228 (11/11/11) --> 215 (12/10/11) --> 215.6 (12/15/11) --> 220 (01/07/12) On 3/9: Temp 98.0, afebrile since last NH visit  BP: 123/78 (112-158/70-92) HR:  55 (55-82)  (of note, there were two incorrect vitals recorded, 18 and 19) RR:  18 (18-22)  Gen: NAD, appropriately interactive although confused, somewhat tired appearing, laying in bed HEENT: MMM, EOMI CV: RRR, no murmurs Pulm: normal respiratory effort, breath sounds normal bilat Abd: soft, NT, obese Ext: no lower ext edema; no redness or swelling in right wrist, full ROM, + finger/toe mvmt bilat; left wrist in splint   A/P: 78 yo F w/ multiple medical problems residing in nursing home -see problem list  *labs ordered on 3/7 (CBC, BMET, TSH, A1c), will follow up.

## 2012-01-18 ENCOUNTER — Encounter: Payer: Self-pay | Admitting: Family Medicine

## 2012-01-18 MED ORDER — FLUTICASONE-SALMETEROL 100-50 MCG/DOSE IN AEPB: 1.0000 | INHALATION_SPRAY | Freq: Two times a day (BID) | RESPIRATORY_TRACT | Status: DC

## 2012-01-18 MED ORDER — FEXOFENADINE HCL 60 MG PO TABS: 60.0000 mg | ORAL_TABLET | Freq: Every day | ORAL | Status: DC

## 2012-01-18 NOTE — Assessment & Plan Note (Signed)
Despite pt complaining of some pain today, overall has been well controlled on current regimen of oxycontin, tylenol, gabapentin, lidoderm patch, cymbalta, seroquel and PRN oxycodone.

## 2012-01-18 NOTE — Assessment & Plan Note (Signed)
Doing well, breathing comfortably.  Currently on allegra and advair with duonebs PRN.

## 2012-01-18 NOTE — Assessment & Plan Note (Signed)
Continue lipitor, next FLP due between 5/13 and 11/13.

## 2012-01-18 NOTE — Assessment & Plan Note (Signed)
Stable, continue cymbalta, seroquel, trazodone

## 2012-01-18 NOTE — Assessment & Plan Note (Signed)
Baseline around 1; BMET done 01/13/12, will f/u results.

## 2012-01-18 NOTE — Assessment & Plan Note (Signed)
Continue synthroid , TSH drawn 3/7, will follow up.

## 2012-01-18 NOTE — Assessment & Plan Note (Signed)
Patient with some weight loss, was 228lb 1/13, decreased to 215lb 2/13, now back up to 220ln 3/13.    BMET, CBC, TSH, A1c pending.  Continue to control related diseases.

## 2012-01-18 NOTE — Assessment & Plan Note (Signed)
BP has been well controlled for the past 2 months on norvasc, ASA.

## 2012-01-18 NOTE — Assessment & Plan Note (Signed)
CBC from 01/13/12 pending.  Will follow.

## 2012-01-18 NOTE — Assessment & Plan Note (Signed)
Advanced dementia, pleasantly confused today.  Still on aricept, question utility.

## 2012-01-18 NOTE — Assessment & Plan Note (Signed)
No redness, warmth or swelling.  Continue gabapentin 300 TID, scheduled tylenol 500 TID, oxycontin 10 BID, oxycodone 5 TID PRN (none used in past 1 month), lidoderm patch.  Cymbalta and seroquel may also be helping.

## 2012-01-24 ENCOUNTER — Other Ambulatory Visit: Payer: Self-pay | Admitting: Family Medicine

## 2012-01-24 DIAGNOSIS — M549 Dorsalgia, unspecified: Secondary | ICD-10-CM

## 2012-01-24 DIAGNOSIS — G894 Chronic pain syndrome: Secondary | ICD-10-CM

## 2012-01-24 MED ORDER — OXYCODONE HCL 10 MG PO TB12: 10.0000 mg | ORAL_TABLET | Freq: Two times a day (BID) | ORAL | Status: DC

## 2012-01-24 NOTE — Telephone Encounter (Signed)
Written and attached to fax to send to [facility]  

## 2012-01-25 ENCOUNTER — Encounter: Payer: Self-pay | Admitting: Pharmacist

## 2012-01-25 DIAGNOSIS — H04129 Dry eye syndrome of unspecified lacrimal gland: Secondary | ICD-10-CM

## 2012-01-25 DIAGNOSIS — M25539 Pain in unspecified wrist: Secondary | ICD-10-CM

## 2012-01-25 DIAGNOSIS — H4010X0 Unspecified open-angle glaucoma, stage unspecified: Secondary | ICD-10-CM

## 2012-01-25 HISTORY — DX: Dry eye syndrome of unspecified lacrimal gland: H04.129

## 2012-01-26 ENCOUNTER — Other Ambulatory Visit: Payer: Self-pay | Admitting: Family Medicine

## 2012-01-26 DIAGNOSIS — G894 Chronic pain syndrome: Secondary | ICD-10-CM

## 2012-01-26 DIAGNOSIS — M549 Dorsalgia, unspecified: Secondary | ICD-10-CM

## 2012-01-26 MED ORDER — OXYCODONE HCL 10 MG PO TB12: 10.0000 mg | ORAL_TABLET | Freq: Two times a day (BID) | ORAL | Status: DC

## 2012-01-26 NOTE — Telephone Encounter (Signed)
Written and attached to fax to send to [facility]  

## 2012-02-04 ENCOUNTER — Encounter: Payer: Self-pay | Admitting: Family Medicine

## 2012-02-04 ENCOUNTER — Non-Acute Institutional Stay: Payer: Self-pay | Admitting: Family Medicine

## 2012-02-04 DIAGNOSIS — M4712 Other spondylosis with myelopathy, cervical region: Secondary | ICD-10-CM

## 2012-02-04 DIAGNOSIS — F0390 Unspecified dementia without behavioral disturbance: Secondary | ICD-10-CM

## 2012-02-04 DIAGNOSIS — I10 Essential (primary) hypertension: Secondary | ICD-10-CM

## 2012-02-04 DIAGNOSIS — J449 Chronic obstructive pulmonary disease, unspecified: Secondary | ICD-10-CM

## 2012-02-04 NOTE — Progress Notes (Signed)
  Subjective:    [name], female    DOB: [birth year], 78 y.o.   MRN: [id number]  HPI' She says that she doesn't feel good. She has pains in her arms and her knees hurt when she walks. She says she walked today.  Complains of cough with some sputum    Review of Systems     Objective:   Physical Exam Alert and pleasantly interactive, lying back in the Mier chair. Not oriented Chest clear, no cough during interview Cor RR. Abdomen Obese, soft without apparent mass Splint on left wrist without apparent swelling Ext bilateral achilles contractures with clonus greater on left, 1+ ankle edema bilaterally       Assessment & Plan:

## 2012-02-04 NOTE — Assessment & Plan Note (Signed)
Slowly progressive, Fortunately she remains pleasant and can't remember that she can't walk or do much for herself

## 2012-02-04 NOTE — Assessment & Plan Note (Signed)
well controlled  

## 2012-02-04 NOTE — Assessment & Plan Note (Signed)
Recent exacerbation resolving

## 2012-02-11 ENCOUNTER — Encounter: Payer: Self-pay | Admitting: Family Medicine

## 2012-02-12 ENCOUNTER — Encounter: Payer: Self-pay | Admitting: Family Medicine

## 2012-03-01 ENCOUNTER — Telehealth: Payer: Self-pay | Admitting: Family Medicine

## 2012-03-01 NOTE — Telephone Encounter (Signed)
[facility] RN calling from [facility] to give update on patient (phone call received on emergency line): She diagnosed pt with respiratory infection and possible COPD exacerbation today.  + cough, + congestion.  + increased mucous production.  Things this may have been triggered by allergies.  Giving doxycyclin 7 day course, duonebs, and allegra daily.  Wanted me to forward this to [facility] team.

## 2012-03-11 LAB — BRAIN NATRIURETIC PEPTIDE: BNP: 55.3 pg/mL

## 2012-03-11 LAB — LIPID PANEL
LDL Cholesterol: 64 mg/dL
LDl/HDL Ratio: 4.2

## 2012-03-11 LAB — TSH: TSH: 2.59

## 2012-03-14 ENCOUNTER — Other Ambulatory Visit: Payer: Self-pay | Admitting: Family Medicine

## 2012-03-14 ENCOUNTER — Encounter: Payer: Self-pay | Admitting: Family Medicine

## 2012-03-14 DIAGNOSIS — G894 Chronic pain syndrome: Secondary | ICD-10-CM

## 2012-03-14 DIAGNOSIS — M549 Dorsalgia, unspecified: Secondary | ICD-10-CM

## 2012-03-14 MED ORDER — OXYCODONE HCL 10 MG PO TB12: 10.0000 mg | ORAL_TABLET | Freq: Two times a day (BID) | ORAL | Status: DC

## 2012-03-14 NOTE — Telephone Encounter (Signed)
Written and attached to fax to send to [facility]  

## 2012-03-15 ENCOUNTER — Non-Acute Institutional Stay: Payer: Self-pay | Admitting: Family Medicine

## 2012-03-15 DIAGNOSIS — M4712 Other spondylosis with myelopathy, cervical region: Secondary | ICD-10-CM

## 2012-03-15 DIAGNOSIS — F0390 Unspecified dementia without behavioral disturbance: Secondary | ICD-10-CM

## 2012-03-17 ENCOUNTER — Encounter: Payer: Self-pay | Admitting: Pharmacist

## 2012-03-17 NOTE — Assessment & Plan Note (Signed)
Functionally quadriplegic

## 2012-03-17 NOTE — Assessment & Plan Note (Signed)
She continues to decline, but has a pleasant disposition

## 2012-03-17 NOTE — Progress Notes (Signed)
  Subjective:    [name], female    DOB: 1933/03/13, 78 y.o.   MRN: [id number]  HPIDrowsy after eating lunch No complaints until her extremities are moved, then she complains of pain    Review of Systems     Objective:   Physical ExamDrowsy, lying in her geri chair with the lift sling beneath her Chest clear laterally Cor RR Abdomen soft, non-tender no masses Extremities contractures that are chronic        Assessment & Plan:

## 2012-03-27 ENCOUNTER — Non-Acute Institutional Stay: Payer: Self-pay | Admitting: Family Medicine

## 2012-03-27 ENCOUNTER — Encounter: Payer: Self-pay | Admitting: Family Medicine

## 2012-03-27 DIAGNOSIS — E785 Hyperlipidemia, unspecified: Secondary | ICD-10-CM

## 2012-03-27 DIAGNOSIS — I10 Essential (primary) hypertension: Secondary | ICD-10-CM

## 2012-03-27 DIAGNOSIS — M25539 Pain in unspecified wrist: Secondary | ICD-10-CM

## 2012-03-27 DIAGNOSIS — E039 Hypothyroidism, unspecified: Secondary | ICD-10-CM

## 2012-03-27 DIAGNOSIS — G894 Chronic pain syndrome: Secondary | ICD-10-CM

## 2012-03-27 DIAGNOSIS — E669 Obesity, unspecified: Secondary | ICD-10-CM

## 2012-03-27 DIAGNOSIS — D649 Anemia, unspecified: Secondary | ICD-10-CM

## 2012-03-27 DIAGNOSIS — J449 Chronic obstructive pulmonary disease, unspecified: Secondary | ICD-10-CM

## 2012-03-27 DIAGNOSIS — E559 Vitamin D deficiency, unspecified: Secondary | ICD-10-CM

## 2012-03-27 DIAGNOSIS — F0390 Unspecified dementia without behavioral disturbance: Secondary | ICD-10-CM

## 2012-03-27 NOTE — Assessment & Plan Note (Signed)
Cymbalta decreased 03/22/12; will need to monitor to ensure her pain does not start to worse.  Continue oxycontin, tylenol, gabapentin, lidoderm patch, seroquel, PRN oxycodone, and lower dose of cymbalta.

## 2012-03-27 NOTE — Assessment & Plan Note (Signed)
Monitor for worsening with decreased cymbalta dose.

## 2012-03-27 NOTE — Assessment & Plan Note (Signed)
No O2 requirement, lungs clear today, doing well.

## 2012-03-27 NOTE — Assessment & Plan Note (Signed)
Hgb stable 

## 2012-03-27 NOTE — Assessment & Plan Note (Signed)
BPs remain well controlled.  Cont norvasc.

## 2012-03-27 NOTE — Assessment & Plan Note (Addendum)
Pleasantly demented. Continue current regimen.

## 2012-03-27 NOTE — Assessment & Plan Note (Signed)
Weight stable around 215lb. BMET, CBC, TSH WNL/acceptable. Continue to control related dx.

## 2012-03-27 NOTE — Progress Notes (Signed)
Subjective: Sitting in chair, finishing breakfast.  Appears very well.  Lucila Maine is present, in room, but on cell phone, so unable to inquire about any specific concerns (although likely he would have been forward with problems or concerns if he had any).  States her pain is about the same, some days are better than others.  Mostly in her wrists.  Looks the best I have seen her in the past few months.  Finished almost all of her breakfast tray.   Cymbalta was decreased to 30mg  DAILY on 03/22/12 per pharmacy review; does not appear to have caused an increase in pain at this time.    Chronic Issues:  1. Hemorrhoids: Rx Miralax, Tucks, Hydrocortisone.   2. Schizophrenia / Dementia: Rx Seroquel, Aricept, Cymbalta.  Psych previously reccommended decreasing Cymbalta and Trazodone and starting Klonopin. Would like to avoid benzos.  Decreased Cymbalta 03/22/12 to 30mg  daily. MMSE in 07/2011: 8/30.   3. Chronic cystitis: Pt with Hx multiple UTIs. Followed by [facility]. Do not get UA unless systemic symptoms.   4. Chronic Pain: Dx Gout, Back Pain, Osteoarthritis, Hx PMR - Continues to c/o pain chronically but has not needed any PRN Oxycodone 5mg . On Tylenol, Gabapentin, Oxycontin, Cymbalta, lidoderm patch 5%. Weaned from prednisone with no change in function. Increased gabapentin to 300 TID with improved pain control and no significant side effects; will continue. MONITOR FOR INCREASED PAIN after decreasing Cymbalta on 03/22/12 NOTE: PATIENT HAS CHRONIC RIGHT HAND PAIN.This is thought to be 2/2 to Cervical Spondylosis with myelopathy. Dr. Sheffield Slider suspects this is the main cause of her quadriparetic state. She is not a surgical candidate due to prolonged myelopathy.  5. L femur frx: Pt with fall 02/10/08 with L femur frx; s/p ORIF 02/14/08 by Dr Shon Baton. No longer on coumadin. Repeat xray of leg per ortho showed separation at repair site. Pt no longer ambulatory. Rx ASA, Calcium, Vitamin D, Bisphosphonate.   6.  Hypothyroidism: Rx Synthroid.   7. HTN: Rx Norvasc. BPs remain stable.   8. Nutrition/: Rx: Vitamin C, Beneprotein, Med Pass supplements, ST, dysphagia diet.   9. HLD: FLP 02/10/12: cholesterol 146, TG 151, HDL 39, LDL 77. Continue lipitor, consider recheck in 6 months to 1 year.  10. Dyspepsia: Rx Omeprazole.   11. CKD: Unchanged. Baseline Creatinine 1.15 (01/21/11).  Most recent Cr 1.17 (02/10/12).   12. Optho: Nuclear sclerosis both eyes, glaucoma. Recheck q 6 months.   13. NOTES: Patient has [facility]. DNR/DNI.   Review of Systems  SEE HPI.    Objective:  Vitals: wt: 220 (01/07/12) --> 211.8 (02/09/12) --> 215.8 (02/16/12) --> 214.5 (03/08/12) On 03/25/12: Temp: 97.3, afebrile since last NH visit  BP: 131/84 (120-154/64-84) HR:  77 (62-100) RR:  20 (18-20)  Gen: NAD, appropriately interactive although confused, well appearing, sitting in chair with breakfast tray HEENT: MMM, EOMI CV: RRR, no murmurs Pulm: normal respiratory effort, breath sounds normal bilat Abd: soft, NT, obese Ext: minimal nonpitting lower ext edema; no redness or swelling in bilateral wrist, full ROM, + finger/toe mvmt bilat; left wrist out of splint; 3/5 hand grip strength bilaterally    A/P: 78 yo F w/ multiple medical problems residing in nursing home -see problem list

## 2012-03-27 NOTE — Assessment & Plan Note (Signed)
Ca normal on 02/10/12; cont Ca and vit D

## 2012-03-27 NOTE — Assessment & Plan Note (Signed)
Most recent TSH, 4/4, normal.  Continue synthroid.

## 2012-03-27 NOTE — Assessment & Plan Note (Signed)
Weight stable, FLP relatively unchanged from previous with exception of higher LDL (77).  Continue lipitor.

## 2012-04-04 ENCOUNTER — Encounter: Payer: Self-pay | Admitting: Pharmacist

## 2012-04-14 ENCOUNTER — Encounter: Payer: Self-pay | Admitting: Pharmacist

## 2012-04-14 DIAGNOSIS — J449 Chronic obstructive pulmonary disease, unspecified: Secondary | ICD-10-CM

## 2012-04-17 ENCOUNTER — Other Ambulatory Visit: Payer: Self-pay | Admitting: Family Medicine

## 2012-04-17 DIAGNOSIS — G894 Chronic pain syndrome: Secondary | ICD-10-CM

## 2012-04-17 DIAGNOSIS — M549 Dorsalgia, unspecified: Secondary | ICD-10-CM

## 2012-04-17 MED ORDER — OXYCODONE HCL 10 MG PO TB12: 10.0000 mg | ORAL_TABLET | Freq: Two times a day (BID) | ORAL | Status: DC

## 2012-04-17 NOTE — Telephone Encounter (Signed)
Written and attached to fax to send to [facility]  

## 2012-05-16 ENCOUNTER — Non-Acute Institutional Stay: Payer: Self-pay | Admitting: Family Medicine

## 2012-05-16 ENCOUNTER — Other Ambulatory Visit: Payer: Self-pay | Admitting: Family Medicine

## 2012-05-16 ENCOUNTER — Encounter: Payer: Self-pay | Admitting: Family Medicine

## 2012-05-16 DIAGNOSIS — E785 Hyperlipidemia, unspecified: Secondary | ICD-10-CM

## 2012-05-16 DIAGNOSIS — G894 Chronic pain syndrome: Secondary | ICD-10-CM

## 2012-05-16 DIAGNOSIS — J449 Chronic obstructive pulmonary disease, unspecified: Secondary | ICD-10-CM

## 2012-05-16 DIAGNOSIS — E039 Hypothyroidism, unspecified: Secondary | ICD-10-CM

## 2012-05-16 DIAGNOSIS — M549 Dorsalgia, unspecified: Secondary | ICD-10-CM

## 2012-05-16 DIAGNOSIS — F0390 Unspecified dementia without behavioral disturbance: Secondary | ICD-10-CM

## 2012-05-16 DIAGNOSIS — I10 Essential (primary) hypertension: Secondary | ICD-10-CM

## 2012-05-16 DIAGNOSIS — E559 Vitamin D deficiency, unspecified: Secondary | ICD-10-CM

## 2012-05-16 DIAGNOSIS — M25539 Pain in unspecified wrist: Secondary | ICD-10-CM

## 2012-05-16 DIAGNOSIS — D649 Anemia, unspecified: Secondary | ICD-10-CM

## 2012-05-16 DIAGNOSIS — E669 Obesity, unspecified: Secondary | ICD-10-CM

## 2012-05-16 MED ORDER — OXYCODONE HCL 10 MG PO TB12: 10.0000 mg | ORAL_TABLET | Freq: Two times a day (BID) | ORAL | Status: DC

## 2012-05-16 NOTE — Telephone Encounter (Signed)
Printed and attached to fax to send to [facility]

## 2012-05-16 NOTE — Progress Notes (Signed)
Subjective: Doing well, sitting in chair finishing breakfast. No SOB, difficulty breathing or cough. Pain is relatively well controlled, still c/o some pain in bilateral legs and right wrist.  Cymbalta was decreased to 30mg  DAILY on 03/22/12 per pharmacy review; does not appear to have caused an increase in pain at this time.   COPD medications were also changed on 03/29/12 for cough: Advair and Allergra D/C'ed, Claritin started, Brovana started, Pulmicort BID started.    Chronic Issues:  1. Hemorrhoids: Rx Miralax, Tucks, Hydrocortisone.   2. Schizophrenia / Dementia: Rx Seroquel, Aricept, Cymbalta.  Psych previously reccommended decreasing Cymbalta and Trazodone and starting Klonopin. Would like to avoid benzos.  Decreased Cymbalta 03/22/12 to 30mg  daily. MMSE in 07/2011: 8/30.   3. Chronic cystitis: Pt with Hx multiple UTIs. Followed by [facility]. Do not get UA unless systemic symptoms.   4. Chronic Pain: Dx Gout, Back Pain, Osteoarthritis, Hx PMR - Continues to c/o pain chronically but has not needed any PRN Oxycodone 5mg . On Tylenol, Gabapentin, Oxycontin, Cymbalta, lidoderm patch 5%. Weaned from prednisone with no change in function. Increased gabapentin to 300 TID with improved pain control and no significant side effects; will continue. MONITOR FOR INCREASED PAIN after decreasing Cymbalta on 03/22/12 --> as of July 2013, pain still under good control. NOTE: PATIENT HAS CHRONIC RIGHT HAND PAIN.This is thought to be 2/2 to Cervical Spondylosis with myelopathy. Dr. Sheffield Slider suspects this is the main cause of her quadriparetic state. She is not a surgical candidate due to prolonged myelopathy.  5. L femur frx: Pt with fall 02/10/08 with L femur frx; s/p ORIF 02/14/08 by Dr Shon Baton. No longer on coumadin. Repeat xray of leg per ortho showed separation at repair site. Pt no longer ambulatory. Rx ASA, Calcium, Vitamin D, Bisphosphonate.   6. Hypothyroidism: Rx Synthroid.   7. HTN: Rx Norvasc. BPs  remain stable.   8. Nutrition/: Rx: Vitamin C, Beneprotein, Med Pass supplements, ST, dysphagia diet.   9. HLD: FLP 02/10/12: cholesterol 146, TG 151, HDL 39, LDL 77. Continue lipitor, consider recheck in 6 months to 1 year.  10. Dyspepsia: Rx Omeprazole.   11. CKD: Unchanged. Baseline Creatinine 1.15 (01/21/11).  Most recent Cr 1.17 (02/10/12).   12. Optho: Nuclear sclerosis both eyes, glaucoma. Recheck q 6 months.   13. NOTES: Patient has [facility]. DNR/DNI.   Review of Systems  SEE HPI.    Objective:  Vitals: wt: 215.8 (02/16/12) --> 214.5 (03/08/12) --> 213.4 (05/10/12) On 05/13/2012: Temp: 97.5, afebrile since last NH visit  BP: 110/67 (106-140/61-87) HR:  72 (64-83) RR:  18 (18-20)  Gen: NAD, alert and interactive, well appearing, sitting in chair with breakfast tray HEENT: MMM, EOMI CV: RRR, no murmurs Pulm: normal respiratory effort, breath sounds normal bilat, no wheezes noted Abd: soft, NT, obese Ext: no lower ext edema; no redness or swelling in bilateral wrist, full ROM, + finger/toe mvmt bilat; left wrist out of splint, right wrist in splint; 3/5 hand grip strength bilaterally    A/P: 78 yo F w/ multiple medical problems residing in nursing home -see problem list

## 2012-05-18 NOTE — Assessment & Plan Note (Signed)
Stable as of 02/10/12, no new symptoms. Cont Synthoid 50 mcg

## 2012-05-18 NOTE — Assessment & Plan Note (Signed)
Weight stable. Diet changed to regular texture on 04/13/12. Continue to control related diseases

## 2012-05-18 NOTE — Assessment & Plan Note (Signed)
Stable, in brace, no changes needed

## 2012-05-18 NOTE — Assessment & Plan Note (Signed)
1 lb weight loss in 2 months. Continue lipitor.

## 2012-05-18 NOTE — Assessment & Plan Note (Signed)
Cont Ca and Vit D.

## 2012-05-18 NOTE — Assessment & Plan Note (Signed)
BPs stable, continue norvasc

## 2012-05-18 NOTE — Assessment & Plan Note (Addendum)
Remains very pleasantly demented w/o active psychosis symptoms. Continue current regimen.

## 2012-05-18 NOTE — Assessment & Plan Note (Signed)
Patient doing well on current regimen- continue lower dose of Cymbalta (30mg ), oxycontin, tylenol, neurontin, lidoderm patch, seroquel, PRN oxy.

## 2012-05-18 NOTE — Assessment & Plan Note (Signed)
Doing well on pulmicort, brovana, claritin. Lungs clear on exam today

## 2012-05-31 ENCOUNTER — Non-Acute Institutional Stay: Payer: Self-pay | Admitting: Family Medicine

## 2012-05-31 DIAGNOSIS — G894 Chronic pain syndrome: Secondary | ICD-10-CM

## 2012-05-31 DIAGNOSIS — I10 Essential (primary) hypertension: Secondary | ICD-10-CM

## 2012-05-31 DIAGNOSIS — E039 Hypothyroidism, unspecified: Secondary | ICD-10-CM

## 2012-05-31 NOTE — Assessment & Plan Note (Signed)
stable °

## 2012-05-31 NOTE — Assessment & Plan Note (Signed)
Normal in May

## 2012-05-31 NOTE — Assessment & Plan Note (Signed)
Patient complaining of chronic pain.  NO evidence of any acute change.  May consider increasing cymbalta back to 60 mg- was recently decreased.

## 2012-05-31 NOTE — Progress Notes (Signed)
  Subjective:    [name], female    DOB: 05-Jul-1933, 78 y.o.   MRN: [id number]  HPI  Nursing Home patient  Seen in dining room.  Complains of generalized pain in legs, arms, back.  Also with frontal headache.  Denies dyspnea, chest pain, cough, fever.  Review of Systems see HPI     Objective:   Physical Exam GEN: Alert, No acute distress CV:  Regular Rate & Rhythm, no murmur Respiratory:  Normal work of breathing, CTAB Abd:  + BS, soft, no tenderness to palpation MSK:  Trace LE edema.  No calf pain or swelling.  No pain over temporal artery.  Wt Readings from Last 3 Encounters:  05/13/12 213 lb 6.4 oz (96.798 kg)  03/08/12 214 lb 8 oz (97.297 kg)  01/07/12 220 lb (99.791 kg)         Assessment & Plan:

## 2012-06-23 ENCOUNTER — Telehealth: Payer: Self-pay | Admitting: Family Medicine

## 2012-06-23 NOTE — Telephone Encounter (Signed)
Received call from [facility] concerning Tammy Salinas's pain medication. A slip had been faxed earlier in the week but has not been returned. Patient is out of OxyContin and pain is worse. Requested I call the pharmacy at [phone] to give refill. Unable to speak with pharmacist about this medication at this time

## 2012-06-23 NOTE — Telephone Encounter (Signed)
Pharmacist returned call. Able to give a 72 hour emergency supply, Oxycontin 10mg  #6. Will fax refill form on Monday to [place]. Being sent by courier tonight.  Shaleka Brines M. Emberlin Verner, M.D. 06/23/2012 8:14 PM

## 2012-06-23 NOTE — Telephone Encounter (Signed)
I did not receive a fax from [employer] on Tammy Salinas this week. Someone may have placed it in another box by mistake. They should all go to Dr Earnest Bailey in my absence. I don't give prescriptions to the nurses at [facility], believing that they should be faxed directly to [facility] and the refill entered in [facility].

## 2012-06-27 ENCOUNTER — Other Ambulatory Visit: Payer: Self-pay | Admitting: Family Medicine

## 2012-06-27 DIAGNOSIS — G894 Chronic pain syndrome: Secondary | ICD-10-CM

## 2012-06-27 DIAGNOSIS — M549 Dorsalgia, unspecified: Secondary | ICD-10-CM

## 2012-06-27 NOTE — Telephone Encounter (Signed)
Faced to [facility]

## 2012-07-26 ENCOUNTER — Other Ambulatory Visit: Payer: Self-pay | Admitting: Family Medicine

## 2012-07-26 DIAGNOSIS — G894 Chronic pain syndrome: Secondary | ICD-10-CM

## 2012-07-26 DIAGNOSIS — M549 Dorsalgia, unspecified: Secondary | ICD-10-CM

## 2012-07-26 MED ORDER — OXYCODONE HCL 10 MG PO TB12: 10.0000 mg | ORAL_TABLET | Freq: Two times a day (BID) | ORAL | Status: DC

## 2012-07-26 NOTE — Telephone Encounter (Signed)
Written and attached to fax to send to [facility]  

## 2012-07-27 ENCOUNTER — Non-Acute Institutional Stay: Payer: Self-pay | Admitting: Family Medicine

## 2012-07-27 DIAGNOSIS — I10 Essential (primary) hypertension: Secondary | ICD-10-CM

## 2012-07-27 DIAGNOSIS — G894 Chronic pain syndrome: Secondary | ICD-10-CM

## 2012-07-27 DIAGNOSIS — G252 Other specified forms of tremor: Secondary | ICD-10-CM

## 2012-07-28 ENCOUNTER — Encounter: Payer: Self-pay | Admitting: Pharmacist

## 2012-07-31 ENCOUNTER — Non-Acute Institutional Stay: Payer: Self-pay | Admitting: Family Medicine

## 2012-07-31 ENCOUNTER — Encounter: Payer: Self-pay | Admitting: Family Medicine

## 2012-07-31 DIAGNOSIS — E039 Hypothyroidism, unspecified: Secondary | ICD-10-CM

## 2012-07-31 DIAGNOSIS — G252 Other specified forms of tremor: Secondary | ICD-10-CM | POA: Insufficient documentation

## 2012-07-31 DIAGNOSIS — E785 Hyperlipidemia, unspecified: Secondary | ICD-10-CM

## 2012-07-31 DIAGNOSIS — G894 Chronic pain syndrome: Secondary | ICD-10-CM

## 2012-07-31 DIAGNOSIS — E559 Vitamin D deficiency, unspecified: Secondary | ICD-10-CM

## 2012-07-31 DIAGNOSIS — I10 Essential (primary) hypertension: Secondary | ICD-10-CM

## 2012-07-31 DIAGNOSIS — E669 Obesity, unspecified: Secondary | ICD-10-CM

## 2012-07-31 HISTORY — DX: Other specified forms of tremor: G25.2

## 2012-07-31 NOTE — Assessment & Plan Note (Addendum)
Had an episode 06/23/12 where she ran out of oxycontin and pain became significantly worse.  Better now that she continues her regimen of Cymbalta (30mg ; decreased from 60mg  in 03/2012), oxycontin, tylenol, neurontin, lidoderm patch, seroquel, PRN oxycodone

## 2012-07-31 NOTE — Assessment & Plan Note (Signed)
well controlled  

## 2012-07-31 NOTE — Progress Notes (Signed)
  Subjective:    [name], female    DOB: 17-Mar-1933, 78 y.o.   MRN: [id number]  HPI complains of spilling food due to hand tremor   Review of Systems     Objective:   Physical Exam  Cardiovascular: Normal rate and regular rhythm.   Neurological: She is alert.       Postural tremor of both hands  Psychiatric:       Confused as usual, but today realizes that she can't walk          Assessment & Plan:

## 2012-07-31 NOTE — Assessment & Plan Note (Signed)
May relate to [facility]. Will observe for possibility of being from her polypharmacy

## 2012-07-31 NOTE — Assessment & Plan Note (Signed)
Seems to be doing as well on decreased Cymbalta

## 2012-07-31 NOTE — Progress Notes (Signed)
Subjective:  Doing well, laying in bed watching TV.  Complains of a little bit of pain all over, but feels relatively well.   Doing well with PT 6x/week.  Chronic Issues:  1. Hemorrhoids: Rx Miralax, Tucks, Hydrocortisone.   2. Schizophrenia / Dementia: Rx Seroquel, Aricept, Cymbalta.  Psych previously reccommended decreasing Cymbalta and Trazodone and starting Klonopin. Would like to avoid benzos.  Decreased Cymbalta 03/22/12 to 30mg  daily. MMSE in 07/2011: 8/30.   3. Chronic cystitis: Pt with Hx multiple UTIs. Followed by [facility]. Do not get UA unless systemic symptoms.   4. Chronic Pain: Dx Gout, Back Pain, Osteoarthritis, Hx PMR - Continues to c/o pain chronically but has not needed any PRN Oxycodone 5mg . On Tylenol, Gabapentin, Oxycontin, Cymbalta, lidoderm patch 5%. Weaned from prednisone with no change in function. Increased gabapentin to 300 TID with improved pain control and no significant side effects; will continue. MONITOR FOR INCREASED PAIN after decreasing Cymbalta on 03/22/12 --> as of Sept 2013, pain still under good control. NOTE: PATIENT HAS CHRONIC RIGHT HAND PAIN.This is thought to be 2/2 to Cervical Spondylosis with myelopathy. Dr. Sheffield Slider suspects this is the main cause of her quadriparetic state. She is not a surgical candidate due to prolonged myelopathy.  5. L femur frx: Pt with fall 02/10/08 with L femur frx; s/p ORIF 02/14/08 by Dr Shon Baton. No longer on coumadin. Repeat xray of leg per ortho showed separation at repair site. Pt no longer ambulatory. Rx ASA, Calcium, Vitamin D, Bisphosphonate.   6. Hypothyroidism: Rx Synthroid.   7. HTN: Rx Norvasc. BPs remain stable.   8. Nutrition/: Rx: Vitamin C, Beneprotein, Med Pass supplements, ST, dysphagia diet.   9. HLD: FLP 02/10/12: cholesterol 146, TG 151, HDL 39, LDL 77. Continue lipitor, consider recheck in 6 months to 1 year.  10. Dyspepsia: Rx Omeprazole.   11. CKD: Unchanged. Baseline Creatinine 1.15 (01/21/11).  Most  recent Cr 1.17 (02/10/12).   12. Optho: Nuclear sclerosis both eyes, glaucoma. Recheck q 6 months.   13. NOTES: Patient has [facility]. DNR/DNI.   Review of Systems  SEE HPI.    Objective:  Vitals: wt: 214.5 (03/08/12) --> 213.4 (05/10/12) --> 219.3 (06/08/12) --> 219.8 (07/12/12) --> 216 (08/02/12) On 08/02/2012: Temp: 97.1, afebrile since last NH visit  BP: 137/82 (110-143/67-85) HR:  73 (60-81) RR:  15 (7-15) O2: 96% (94-98%) RA  Gen: NAD, alert and interactive, well appearing, sitting in chair with breakfast tray HEENT: MMM, EOMI CV: RRR, no murmurs Pulm: normal respiratory effort, breath sounds normal bilat, no wheezes noted Abd: soft, NT, obese Ext: patch over right wrist; full ROM of bilateral hands and wrists   A/P: 78 yo F w/ multiple medical problems residing in nursing home -see problem list

## 2012-08-08 ENCOUNTER — Encounter: Payer: Self-pay | Admitting: Family Medicine

## 2012-08-08 NOTE — Assessment & Plan Note (Signed)
Weight relatively stable, overall slight increase in the past 3 months. FLP ok with exception of elevated TGs. Continue lipitor

## 2012-08-08 NOTE — Assessment & Plan Note (Signed)
Weight stable, FLP ok, BPs ok, A1c normal (5.7)

## 2012-08-08 NOTE — Assessment & Plan Note (Signed)
TSH normal 03/2012

## 2012-08-08 NOTE — Assessment & Plan Note (Addendum)
BPs controlled; BMET with Cr 1.46 06/2012; was 1.54 01/2012. Cont norvasc

## 2012-08-08 NOTE — Assessment & Plan Note (Signed)
Ca normal on BMET 06/2012; continue Ca and Vit D.

## 2012-09-08 ENCOUNTER — Telehealth: Payer: Self-pay | Admitting: Family Medicine

## 2012-09-08 NOTE — Telephone Encounter (Signed)
Received call from [facility] for recetification of dose regarding change in patient's seroquel dose. Nurse reports that seroquel had been written for 68.5. Upon review of the patient's medication, it appears that she used to take 75mg  in the morning and 75mg  at night. The nurse understood that the dose had been changed to 2.5 pills in the morning and 3 pills (25mg  each) at night. I gave order for patient's morning dose to be changed to 62.5mg  or2.5tabs of the 25mg  tablets.  Currently patient on seroquel 62.5mg  qam and 75mg  qhs.

## 2012-09-14 ENCOUNTER — Non-Acute Institutional Stay: Payer: Self-pay | Admitting: Family Medicine

## 2012-09-14 DIAGNOSIS — G894 Chronic pain syndrome: Secondary | ICD-10-CM

## 2012-09-14 DIAGNOSIS — E559 Vitamin D deficiency, unspecified: Secondary | ICD-10-CM

## 2012-09-14 DIAGNOSIS — F323 Major depressive disorder, single episode, severe with psychotic features: Secondary | ICD-10-CM

## 2012-09-14 DIAGNOSIS — I10 Essential (primary) hypertension: Secondary | ICD-10-CM

## 2012-09-14 DIAGNOSIS — E039 Hypothyroidism, unspecified: Secondary | ICD-10-CM

## 2012-09-14 MED ORDER — QUETIAPINE FUMARATE 25 MG PO TABS: ORAL_TABLET | ORAL | Status: DC

## 2012-09-14 NOTE — Progress Notes (Signed)
Subjective:  Doing well, laying in bed watching TV.  Complains of cramping in her hands bilaterally and her right ankle/foot.  Patient's son is at bedside and notes this is a relatively new complaint/different from her usual complaints.  Patient states that the cramps are making it difficult to walk, although she is no longer mobile.  Son feels like we are adequately controlling his mother's pain.  Is very pleased with the frequency of updates he receives from [place] with [facility].   Chronic Issues:  1. Hemorrhoids: Rx Miralax, Tucks, Hydrocortisone.   2. Schizophrenia / Dementia: Rx Seroquel, Aricept, Cymbalta.  Psych previously reccommended decreasing Cymbalta and Trazodone and starting Klonopin. Would like to avoid benzos.  Decreased Cymbalta 03/22/12 to 30mg  daily. MMSE in 07/2011: 8/30.   3. Chronic cystitis: Pt with Hx multiple UTIs. Followed by [facility]. Do not get UA unless systemic symptoms.   4. Chronic Pain: Dx Gout, Back Pain, Osteoarthritis, Hx PMR - Continues to c/o pain chronically but has not needed any PRN Oxycodone 5mg . On Tylenol, Gabapentin, Oxycontin, Cymbalta, lidoderm patch 5%. Weaned from prednisone with no change in function. Increased gabapentin to 300 TID with improved pain control and no significant side effects; will continue. MONITOR FOR INCREASED PAIN after decreasing Cymbalta on 03/22/12 --> as of Nov 2013, pain still under good control. NOTE: PATIENT HAS CHRONIC RIGHT HAND PAIN.This is thought to be 2/2 to Cervical Spondylosis with myelopathy. Dr. Sheffield Slider suspects this is the main cause of her quadriparetic state. She is not a surgical candidate due to prolonged myelopathy.  5. L femur frx: Pt with fall 02/10/08 with L femur frx; s/p ORIF 02/14/08 by Dr Shon Baton. No longer on coumadin. Repeat xray of leg per ortho showed separation at repair site. Pt no longer ambulatory. Rx ASA, Calcium, Vitamin D, Bisphosphonate.   6. Hypothyroidism: Rx Synthroid.   7. HTN: Rx  Norvasc. BPs remain stable.   8. Nutrition/: Rx: Vitamin C, Beneprotein, Med Pass supplements, ST, dysphagia diet.   9. HLD: FLP 02/10/12: cholesterol 146, TG 151, HDL 39, LDL 77. Continue lipitor, consider recheck in 6 months to 1 year.  10. Dyspepsia: Rx Omeprazole.   11. CKD: Unchanged. Baseline Creatinine 1.15 (01/21/11).  Most recent Cr 1.17 (02/10/12).   12. Optho: Nuclear sclerosis both eyes, glaucoma. Recheck q 6 months.   13. NOTES: Patient has [facility]. DNR/DNI.   Review of Systems  SEE HPI.    Objective:  Vitals: wt:  219.3 (06/08/12) --> 219.8 (07/12/12) --> 216 (08/02/12) --> 219.6 (08/17/12) On 09/09/2012: Temp: 97.0, afebrile since last NH visit  BP: 98/86 (98-133/66-79) HR:  68 (62-76) RR:  19 (19) O2: 97% (92-99%) RA  Gen: NAD, alert and interactive, well appearing, laying in bed watching TV; thinks that it is March 1990 and she is at her in-laws, oriented to herself and her son HEENT: MMM, EOMI CV: RRR, no murmurs Pulm: normal respiratory effort, breath sounds normal bilat, no wheezes noted Abd: soft, minimal mid-abdomen tenderness to palpation without obvious bruise or swelling, obese Ext:  full ROM of bilateral hands and wrists, obvious wrist deformities, able to open and close hands entirely, no swelling or contractures noted, 3/5 grip strength bilaterally; compression stockings in place bilateral legs   A/P: 78 yo F w/ multiple medical problems residing in nursing home -see problem list

## 2012-09-14 NOTE — Assessment & Plan Note (Signed)
Continue synthroid. Last TSH 03/2012 was normal.

## 2012-09-14 NOTE — Assessment & Plan Note (Signed)
Continues to do well even on decrease Cymbalta dose. No changes made.

## 2012-09-14 NOTE — Assessment & Plan Note (Signed)
BPs remain well controlled for the most part.  Continue norvasc.

## 2012-09-14 NOTE — Assessment & Plan Note (Signed)
Continue Ca and Vit D replacement therapy.

## 2012-09-14 NOTE — Assessment & Plan Note (Signed)
Small decrease in Seroquel from 75 BID to 75 BID except Wed AM where new dose is 62.5mg  in an attempt to see if we can slowly wean.  Will monitor for worsening psychosis or irritability on Wed PMs/Thursdays.  If this happens, will increase back to 75mg .

## 2012-09-20 ENCOUNTER — Non-Acute Institutional Stay: Payer: Self-pay | Admitting: Family Medicine

## 2012-09-20 LAB — CBC AND DIFFERENTIAL
BUN, Bld: 31
CO2: 28 mmol/L
Chloride: 109 mmol/L
Creat: 1.34
Glucose: 86
Hemoglobin: 12.5 g/dL (ref 12.0–16.0)

## 2012-09-22 ENCOUNTER — Other Ambulatory Visit: Payer: Self-pay | Admitting: Family Medicine

## 2012-09-22 DIAGNOSIS — G894 Chronic pain syndrome: Secondary | ICD-10-CM

## 2012-09-22 DIAGNOSIS — M549 Dorsalgia, unspecified: Secondary | ICD-10-CM

## 2012-09-22 MED ORDER — OXYCODONE HCL 10 MG PO TB12: 10.0000 mg | ORAL_TABLET | Freq: Two times a day (BID) | ORAL | Status: DC

## 2012-09-22 NOTE — Telephone Encounter (Signed)
Written and attached to fax to send to [facility]  

## 2012-09-27 ENCOUNTER — Non-Acute Institutional Stay: Payer: Self-pay | Admitting: Family Medicine

## 2012-09-27 DIAGNOSIS — M4712 Other spondylosis with myelopathy, cervical region: Secondary | ICD-10-CM

## 2012-09-27 DIAGNOSIS — F0390 Unspecified dementia without behavioral disturbance: Secondary | ICD-10-CM

## 2012-09-27 DIAGNOSIS — G2401 Drug induced subacute dyskinesia: Secondary | ICD-10-CM

## 2012-09-27 DIAGNOSIS — E669 Obesity, unspecified: Secondary | ICD-10-CM

## 2012-09-27 DIAGNOSIS — G252 Other specified forms of tremor: Secondary | ICD-10-CM

## 2012-09-28 NOTE — Assessment & Plan Note (Signed)
Maintaining her weight.

## 2012-09-28 NOTE — Assessment & Plan Note (Signed)
No acute problems with function

## 2012-09-28 NOTE — Addendum Note (Signed)
Addended by: Zachery Dauer on: 09/28/2012 05:00 AM   Modules accepted: Orders

## 2012-09-28 NOTE — Assessment & Plan Note (Signed)
Today seems primarily due to weakness

## 2012-09-28 NOTE — Progress Notes (Signed)
  Subjective:    [name], female    DOB: Dec 01, 1932, 78 y.o.   MRN: [id number]  HPI No recent problems reported by staff  She has no complaints. Says she was walking yesterday, helping to work in the yard. Does endorse some pain in her legs.  Review of Systems     Objective:   Physical Exam  Constitutional:       Generalized obesity   Cardiovascular: Normal rate and regular rhythm.   Pulmonary/Chest: Effort normal and breath sounds normal.  Abdominal: Soft. She exhibits no distension. There is no tenderness. There is no rebound and no guarding.  Musculoskeletal:       Bilateral trace edema No clonus, but DTR's 3+ in legs.   Neurological:       Drowsy Awakens easily. Pleasantly cooperative Has symmetric jerking in arms when holding them up, but finger to nose accurate No rest tremor, but wearing thumb spica splints Minimal TD movements of lips  Psychiatric:       Pleasantly confused          Assessment & Plan:

## 2012-10-12 ENCOUNTER — Non-Acute Institutional Stay: Payer: Self-pay | Admitting: Family Medicine

## 2012-10-12 DIAGNOSIS — D649 Anemia, unspecified: Secondary | ICD-10-CM

## 2012-10-12 DIAGNOSIS — E785 Hyperlipidemia, unspecified: Secondary | ICD-10-CM

## 2012-10-12 DIAGNOSIS — E559 Vitamin D deficiency, unspecified: Secondary | ICD-10-CM

## 2012-10-12 DIAGNOSIS — N19 Unspecified kidney failure: Secondary | ICD-10-CM

## 2012-10-12 LAB — CBC
Albumin: 3.5
Bilirubin, Total: 0.4
CO2: 31 mmol/L
Cholesterol: 131 mg/dL (ref 0–200)
Glucose: 82
LDL Cholesterol: 68 mg/dL
MCH: 30.9
RBC: 4.05
Sodium: 143 mmol/L (ref 137–147)
Total Protein: 5.7 g/dL
Triglycerides: 90
WBC: 6.5

## 2012-10-19 ENCOUNTER — Other Ambulatory Visit: Payer: Self-pay | Admitting: Family Medicine

## 2012-10-19 DIAGNOSIS — M549 Dorsalgia, unspecified: Secondary | ICD-10-CM

## 2012-10-19 DIAGNOSIS — G894 Chronic pain syndrome: Secondary | ICD-10-CM

## 2012-10-19 MED ORDER — OXYCODONE HCL 10 MG PO TB12: 10.0000 mg | ORAL_TABLET | Freq: Two times a day (BID) | ORAL | Status: DC

## 2012-10-19 NOTE — Telephone Encounter (Signed)
Written and attached to fax to send to [facility]  

## 2012-10-27 ENCOUNTER — Encounter: Payer: Self-pay | Admitting: Family Medicine

## 2012-10-27 NOTE — Telephone Encounter (Signed)
This encounter was created in error - please disregard.

## 2012-10-27 NOTE — Telephone Encounter (Signed)
Error

## 2012-11-20 ENCOUNTER — Other Ambulatory Visit: Payer: Self-pay | Admitting: Family Medicine

## 2012-11-20 DIAGNOSIS — G894 Chronic pain syndrome: Secondary | ICD-10-CM

## 2012-11-20 DIAGNOSIS — M549 Dorsalgia, unspecified: Secondary | ICD-10-CM

## 2012-11-20 MED ORDER — OXYCODONE HCL 10 MG PO TB12: 10.0000 mg | ORAL_TABLET | Freq: Two times a day (BID) | ORAL | Status: DC

## 2012-11-22 ENCOUNTER — Non-Acute Institutional Stay: Payer: Self-pay | Admitting: Family Medicine

## 2012-11-22 DIAGNOSIS — G894 Chronic pain syndrome: Secondary | ICD-10-CM

## 2012-11-22 DIAGNOSIS — F323 Major depressive disorder, single episode, severe with psychotic features: Secondary | ICD-10-CM

## 2012-11-22 DIAGNOSIS — I10 Essential (primary) hypertension: Secondary | ICD-10-CM

## 2012-11-22 NOTE — Assessment & Plan Note (Signed)
No complaints of pain today.  No new synovitis noted on exam.

## 2012-11-22 NOTE — Assessment & Plan Note (Signed)
-   Well-controlled, continue current meds

## 2012-11-22 NOTE — Progress Notes (Signed)
  Subjective:    [name], female    DOB: Dec 29, 1932, 79 y.o.   MRN: [id number]  HPI Seen for routine nursing home follow-up  Patient reoprts no complaints.  Nursing reports she seemed disoriented and did not eat well yesterday, today back at baseline.   Review of Systems    see HPI Objective:   Physical Exam  GEN:  No acute distress, laying in bed, breakfast tray almost completely eaten. CV:  Regular Rate & Rhythm, no murmur Respiratory:  Normal work of breathing, CTAB Abd:  + BS, soft, no tenderness to palpation Ext: no pre-tibial edema Psych: not oriented.  Pleasantly confused at this time.        Assessment & Plan:

## 2012-11-22 NOTE — Assessment & Plan Note (Addendum)
Waxing and waning psychosis, appears at baseline today per nursing, without oversedation.   No change in medication today.  If trend is more sedation, may consider trial of cutting back seroquel from 75 bid to 50 bid.

## 2012-11-24 ENCOUNTER — Non-Acute Institutional Stay: Payer: Self-pay | Admitting: Family Medicine

## 2012-11-24 DIAGNOSIS — I10 Essential (primary) hypertension: Secondary | ICD-10-CM

## 2012-11-24 DIAGNOSIS — G894 Chronic pain syndrome: Secondary | ICD-10-CM

## 2012-11-24 DIAGNOSIS — E669 Obesity, unspecified: Secondary | ICD-10-CM

## 2012-11-24 NOTE — Progress Notes (Signed)
Subjective:  Doing well, sitting in recliner watching TV.  Discussed patient with Martie Lee Immunologist) who states son remains please with his mother's care.  Martie Lee reports that Tammy Salinas complains of dysuria today-- is currently on day 5/5 of Keflex for UTI (urine was checked because of increase in delusions and sleepiness, no fevers, N/V or other systemic symptoms).  Initially started on Levaquin which was changed to keflex due to concerns for QT prolongation.  Will extend Keflex to 7 day course per Sabrina's request due to this complaint of dysuria today.    Chronic Issues:  1. Hemorrhoids: Rx Miralax, Tucks, Hydrocortisone.   2. Schizophrenia / Dementia: Rx Seroquel, Aricept, Cymbalta.  Psych previously reccommended decreasing Cymbalta and Trazodone and starting Klonopin. Would like to avoid benzos.  Decreased Cymbalta 03/22/12 to 30mg  daily. MMSE in 07/2011: 8/30.   3. Chronic cystitis: Pt with Hx multiple UTIs. Followed by [facility]. Do not get UA unless systemic symptoms.   4. Chronic Pain: Dx Gout, Back Pain, Osteoarthritis, Hx PMR - Continues to c/o pain chronically but has not needed any PRN Oxycodone 5mg . On Tylenol, Gabapentin, Oxycontin, Cymbalta, lidoderm patch 5%. Weaned from prednisone with no change in function. Increased gabapentin to 300 TID with improved pain control and no significant side effects; will continue. MONITOR FOR INCREASED PAIN after decreasing Cymbalta on 03/22/12 --> as of Nov 2013, pain still under good control. NOTE: PATIENT HAS CHRONIC RIGHT HAND PAIN.This is thought to be 2/2 to Cervical Spondylosis with myelopathy. Dr. Sheffield Slider suspects this is the main cause of her quadriparetic state. She is not a surgical candidate due to prolonged myelopathy.  5. L femur frx: Pt with fall 02/10/08 with L femur frx; s/p ORIF 02/14/08 by Dr Shon Baton. No longer on coumadin. Repeat xray of leg per ortho showed separation at repair site. Pt no longer ambulatory. Rx ASA, Calcium, Vitamin  D, Bisphosphonate.   6. Hypothyroidism: Rx Synthroid.   7. HTN: Rx Norvasc. BPs remain stable.   8. Nutrition/: Rx: Vitamin C, Beneprotein, Med Pass supplements, ST, dysphagia diet.   9. HLD: FLP 02/10/12: cholesterol 146, TG 151, HDL 39, LDL 77. Continue lipitor, consider recheck in 6 months to 1 year.  10. Dyspepsia: Rx Omeprazole.   11. CKD: Unchanged. Baseline Creatinine 1.15 (01/21/11).  Most recent Cr 1.17 (02/10/12).   12. Optho: Nuclear sclerosis both eyes, glaucoma. Recheck q 6 months.   13. NOTES: Patient has [facility]. DNR/DNI.   Review of Systems  SEE HPI.    Objective:  Vitals: wt: 216 (08/02/12) --> 219.6 (08/17/12) --> 227 (11/25) --> 225 (12/24) --> 210 (11/07/12) --> 211 (11/15/12) On 12/05/12 : Temp: 98.0, afebrile since last NH visit  BP: 118/69 (97-148/50-90) HR:  69 (60-85) RR:  10 (8-20) O2:  97% (92-98%) RA  Gen: NAD, alert and interactive, well appearing, sitting in her recliner watching TV HEENT: MMM, EOMI CV: RRR, no murmurs Pulm: normal respiratory effort, breath sounds normal bilat, no wheezes noted Abd: soft, minimal diffuse tenderness to palpation without obvious bruise or swelling, obese Ext:  full ROM of bilateral hands and wrists, obvious wrist deformities, able to open and close hands entirely, no swelling or contractures noted, 3/5 grip strength bilaterally; compression stockings in place bilateral legs   A/P: 79 yo F w/ multiple medical problems residing in nursing home -see problem list

## 2012-12-01 LAB — URINE CULTURE: Urine Culture Result: >100000

## 2012-12-02 ENCOUNTER — Other Ambulatory Visit: Payer: Self-pay | Admitting: Family Medicine

## 2012-12-02 MED ORDER — CEPHALEXIN 500 MG PO CAPS: 500.0000 mg | ORAL_CAPSULE | Freq: Three times a day (TID) | ORAL | Status: AC

## 2012-12-02 MED ORDER — LEVOFLOXACIN 250 MG PO TABS: 250.0000 mg | ORAL_TABLET | Freq: Every day | ORAL | Status: DC

## 2012-12-02 NOTE — Telephone Encounter (Signed)
Received call from Leonarda Salon NP. Starting the patient on Levaquin to cover for UTI. UA and culture obtained because of increased hallucinations.   Plan: D/C levaquin, risk of QT prolongation with seroquel. Start keflex 500 mg q TID x 5 days.

## 2012-12-07 NOTE — Assessment & Plan Note (Signed)
BPs well controlled, no changes

## 2012-12-07 NOTE — Assessment & Plan Note (Signed)
Weight is mostly stable, no changes at this time.

## 2012-12-07 NOTE — Assessment & Plan Note (Signed)
Relatively well controlled, continue current regimen of Cymbalta (30mg ; decreased from 60mg  in 03/2012), oxycontin, tylenol, neurontin, lidoderm patch, seroquel, PRN oxycodone

## 2012-12-15 ENCOUNTER — Non-Acute Institutional Stay: Payer: Self-pay | Admitting: Family Medicine

## 2012-12-15 DIAGNOSIS — I10 Essential (primary) hypertension: Secondary | ICD-10-CM

## 2012-12-15 DIAGNOSIS — G894 Chronic pain syndrome: Secondary | ICD-10-CM

## 2012-12-15 DIAGNOSIS — M549 Dorsalgia, unspecified: Secondary | ICD-10-CM

## 2012-12-15 MED ORDER — MORPHINE SULFATE 15 MG PO TABS: 15.0000 mg | ORAL_TABLET | Freq: Three times a day (TID) | ORAL | Status: DC | PRN

## 2012-12-15 MED ORDER — MORPHINE SULFATE ER 20 MG PO CP24: 20.0000 mg | ORAL_CAPSULE | Freq: Every day | ORAL | Status: DC

## 2012-12-15 NOTE — Progress Notes (Signed)
  Subjective:    [name], female    DOB: 1933/05/11, 79 y.o.   MRN: [id number]  HPI Chronic pain - When asked about pain she complains about her right forearm. Her insurer sent a letter requiring a change from Oxycontin and gave MS contin as an alternative.   Patient visit was on2/5/14 Review of Systems     Objective:   Physical Exam She expresses discomfort any where touched, particularly the right forearm. It has a lidoderm patch on the dorsal side, but no acute changes. She is weak, but can suppinate it without apparent increase in discomfort. She can't flex either elbow enough to touch her mouth.  Heart - RR Abdomen - obese, non-tender without apparent mass.       Assessment & Plan:

## 2012-12-15 NOTE — Assessment & Plan Note (Signed)
Adequate control. 

## 2012-12-15 NOTE — Assessment & Plan Note (Signed)
Pain adequately controlled for her level of function. Will switch her Oxydodone meds to [facility]

## 2012-12-18 ENCOUNTER — Encounter: Payer: Self-pay | Admitting: Family Medicine

## 2012-12-18 ENCOUNTER — Non-Acute Institutional Stay: Payer: Self-pay | Admitting: Family Medicine

## 2012-12-18 LAB — CBC WITH DIFFERENTIAL/PLATELET
Chloride: 112 mmol/L
Creat: 1.85
HCT: 35 %
Hemoglobin: 11.3 g/dL — AB (ref 12.0–16.0)
MCV: 92.6 fL
Potassium: 4.5 mmol/L
RDW: 15.7

## 2012-12-18 NOTE — Progress Notes (Signed)
Subjective:     [name], female   DOB: Jul 14, 1933, 79 y.o.   MRN: [id number]  HPI 79 yo F nursing home patient seen due to report of sedation since yesterday. Patient responding to voice, minimally. Also responding to sternal rub. Reports that she is in pain.   Her narcotics were changed on 12/14/12 due to oxycontin no longer being covered by her insurance.   Her son noticed the changed in mentals status and was informed of the medication change. He is upset that he was not informed of the medication change sooner.   Review of Systems As per HPI    Objective:   Physical Exam BP 110/65  Pulse 90  Temp(Src) 98.7 F (37.1 C)  Resp 19  SpO2 96% on 3.5 L [place] (baseline) General appearance: asleep, arousbale. open eyes to sternal rub. open eyes and smiles when she see [facility] NP.  Lungs: clear to auscultation bilaterally Heart: regular rate and rhythm, S1, S2 normal, no murmur, click, rub or gallop  Meds reviewed: MS Contin 30 mg BID-patient received doses as schedule. Last dose yesterday PM.  MS IR 10 mg TID-patient received doses.   Labs: entered manually. Cr elevated to 1.85 from baseline 1.3-1.5    Assessment and Plan:      79 yo F evaluated for sedation   1. Sedation: Most likely etiology is narcotic OD.  She was previously receiving 35 mg of morphine daily (10 mg MS contin BID, and 5 mg MSIR TID).  She received 90 mg of oxycodone for 3 days.  Plan: Hold MScontin and MS IR. Hold lidoderm patch.  Decided against narcan as patient's sats are stable of baseline supplemental O2.   2. Elevated Cr from baseline. Suspect prerenal etiology as she had poor po intake for the past two days.  1/2 NS at 100 ml/hr x 10 hrs.   Will recheck tomorrow.

## 2012-12-19 ENCOUNTER — Non-Acute Institutional Stay: Payer: Self-pay | Admitting: Family Medicine

## 2012-12-19 NOTE — Progress Notes (Signed)
Patient ID: YURITZI KAMP, female   DOB: 08/20/1933, 79 y.o.   MRN: [id number] Subjective:     Patient ID: KOI YARBRO, female   DOB: 11/18/1932, 79 y.o.   MRN: [id number]  HPI 79 yo F nursing home patient seen to f/u sedation. She is more alert toady. Her daughters and great granddaughter are in her room. She is opening her eyes to voice and following commands. She denies pain.   Review of Systems As per HPI    Objective:   Physical Exam Pulse 94  SpO2 96% on 3.5 L [place] (baseline) General appearance: awake, eyes open to voice. Lungs: clear to auscultation bilaterally Heart: regular rate and rhythm, S1, S2 normal, no murmur, click, rub or gallop Neuro: awake, alert to person, eyes open to voice. Following commands.   D51/2 NS running at 100 ml/hr (there was no 1/2 NS)    Assessment and Plan:      79 yo F evaluated for sedation attributed to narcotic overdose.  1. Sedation: A: improving off narcotics. No pain.  P: Continue to hold all pain medications. Will f/u tomorrow AM.   2. Elevated Cr from baseline. Suspect prerenal etiology as she had poor po intake for the past three days with minimal improvement today. Plan: continue IVFs decrease to 75 ml/hr. Change to 1/2 NS when available.   Will recheck tomorrow.

## 2012-12-20 ENCOUNTER — Non-Acute Institutional Stay: Payer: Self-pay | Admitting: Family Medicine

## 2012-12-21 NOTE — Progress Notes (Signed)
[name], female   DOB: Jan 21, 1933, 79 y.o.   MRN: [id number] Subjective:   HPI 79 yo F nursing home patient seen to f/u sedation. She is slightly more alert today compared to yesterday. Per report she ate lunch yesterday. Did not eat breakfast today.   Review of Systems As per HPI    Objective:   Physical Exam BP 109/63  Pulse 82  Temp(Src) 96.8 F (36 C)  Resp 20  SpO2 98% on 3.5 L [place] (baseline) General appearance: awake, sitting up in bed.  Lungs: clear to auscultation bilaterally Heart: regular rate and rhythm, S1, S2 normal, no murmur, click, rub or gallop Neuro: awake, alert to person, eyes open to voice. Following commands.   D51/2 NS running at 100 ml/hr (there was no 1/2 NS)    Assessment and Plan:      79 yo F evaluated for sedation attributed to narcotic overdose.  1. Sedation: A: improving off narcotics. No pain.  P: Continue to hold all pain medications pending return to baseline level of alertness.  Patient has responded well to intraarticular wrist injections in the past, will defer to PCP.    2. Elevated Cr from baseline. Suspect prerenal etiology as she had poor po intake with improvement over the past 24 hrs.  Plan:  D/C IV fluids. Nursing staff notified to encourage PO intake.

## 2012-12-22 ENCOUNTER — Non-Acute Institutional Stay: Payer: Self-pay | Admitting: Family Medicine

## 2012-12-22 NOTE — Progress Notes (Signed)
Patient ID: SONDRA BLIXT, female   DOB: 01-20-1933, 79 y.o.   MRN: [id number]  Subjective:   HPI 79 yo F nursing home patient seen to f/u narcotic overdose characterized by sedation and labored breathing on 12/17/12. Her son and daughter-in -law, Mr. and Mrs. Lawrence are in her room. She is sitting up in her recliner.   Per reports she ate well today. She was up today.  She has not complained of pain.   Review of Systems As per HPI    Objective:   Physical Exam BP 126/94  Pulse 50  Temp(Src) 98.4 F (36.9 C)  Resp 20 on 3.5 L [place] (baseline) General appearance: asleep, sitting up in chair. No distress.  Lungs: clear to auscultation bilaterally Heart: regular rate and rhythm, S1, S2 normal, no murmur, click, rub or gallop Ext: no edema.     Assessment and Plan:      79 yo F evaluated for sedation attributed to narcotic overdose.  1. Sedation: A: improving off narcotics. No pain. Patient's son remain upset with the situation and with the fact that he was not informed of the medication change.  P: Plan to continue to hold all narcotics. Patient's son request that she not be restarted on morphine at any dose.  Patient's son also request a phone call from Dr. Sheffield Slider.  Recommend assessing need for chronic narcotics. If there is a need, opana at lowest starting dose is an option.   2. Elevated Cr from baseline. Suspect prerenal etiology. Patient s/p 48 hrs of IVFs. IVFs D/Cd 2/12/4.   P: Check BMP on 12/25/12. Nursing staff notified to encourage PO intake and notify MD if PO intake is poor.

## 2012-12-27 ENCOUNTER — Non-Acute Institutional Stay: Payer: Self-pay | Admitting: Family Medicine

## 2012-12-27 DIAGNOSIS — I10 Essential (primary) hypertension: Secondary | ICD-10-CM

## 2012-12-27 DIAGNOSIS — D649 Anemia, unspecified: Secondary | ICD-10-CM

## 2012-12-27 DIAGNOSIS — N19 Unspecified kidney failure: Secondary | ICD-10-CM

## 2012-12-27 NOTE — Assessment & Plan Note (Signed)
Fair control.

## 2012-12-27 NOTE — Progress Notes (Signed)
  Subjective:    [name], female    DOB: 1933/07/07, 79 y.o.   MRN: [id number]  HPI Yesterday she was very drowsy and denied any pain. Today she is alert sitting in the geri chair and complains when asked that her feet hurt. Staff confirms that that has been a chronic complaint. I met with her son and apologized for not informing him of the change in her medications to morphine. He doesn't want to use it again, and stated that he believed that some of the pain is "in her head" and that she may complain of pain even when receiving adequate medication. She currently is not receiving a narcotic   Review of Systems     Objective:   Physical Exam Alert sitting in wheel chair watching TV Cor RR  Abdomen obese, soft Ext feet contracted and painful to passive movement, but no lesions or redness. She can wiggle her toes a little. She has a Lidoderm patch on her right wrist and denies pain there. No edema.        Assessment & Plan:

## 2012-12-27 NOTE — Assessment & Plan Note (Signed)
Now off narcotic and having mild complaints of feet pains.

## 2012-12-27 NOTE — Assessment & Plan Note (Signed)
Stable

## 2013-01-22 ENCOUNTER — Non-Acute Institutional Stay (INDEPENDENT_AMBULATORY_CARE_PROVIDER_SITE_OTHER): Payer: Self-pay | Admitting: Family Medicine

## 2013-01-22 DIAGNOSIS — G894 Chronic pain syndrome: Secondary | ICD-10-CM

## 2013-01-22 DIAGNOSIS — E669 Obesity, unspecified: Secondary | ICD-10-CM

## 2013-01-22 DIAGNOSIS — F323 Major depressive disorder, single episode, severe with psychotic features: Secondary | ICD-10-CM

## 2013-01-22 DIAGNOSIS — I10 Essential (primary) hypertension: Secondary | ICD-10-CM

## 2013-01-22 NOTE — Progress Notes (Signed)
Subjective:  Sitting with son watching NCAA tournament on TV.  Both she and her son feel like she is doing well.  Her pain appears to be well controlled, despite stopping all narcotics last month due to increased somnolence.  She has not been crying, which she tends to do when her pain gets severe.  She will still complaining of pain all over, which is relatively status quo for her, but her son feels like she is doing pretty well.    Her main concern is feel like the fingers of her right hand are getting cold and cramping.  Son's main concern is that he has noticed she has not had her compression stockings on as frequently as she used to.    Chronic Issues:  1. Hemorrhoids: Rx Miralax, Tucks, Hydrocortisone.   2. Schizophrenia / Dementia: Rx Seroquel, Aricept, Cymbalta.  Psych previously reccommended decreasing Cymbalta and Trazodone and starting Klonopin. Would like to avoid benzos.  Decreased Cymbalta 03/22/12 to 30mg  daily. MMSE in 07/2011: 8/30.   3. Chronic cystitis: Pt with Hx multiple UTIs. Followed by [facility]. Do not get UA unless systemic symptoms.   4. Chronic Pain: Dx Gout, Back Pain, Osteoarthritis, Hx PMR - Continues to c/o pain chronically but has not needed any PRN Oxycodone 5mg . On Tylenol, Gabapentin, Oxycontin, Cymbalta, lidoderm patch 5%. Weaned from prednisone with no change in function. Increased gabapentin to 300 TID with improved pain control and no significant side effects; will continue. MONITOR FOR INCREASED PAIN after decreasing Cymbalta on 03/22/12 --> as of Nov 2013, pain still under good control. NOTE: PATIENT HAS CHRONIC RIGHT HAND PAIN.This is thought to be 2/2 to Cervical Spondylosis with myelopathy. Dr. Sheffield Slider suspects this is the main cause of her quadriparetic state. She is not a surgical candidate due to prolonged myelopathy. AS OF 12/2012, all narcotics were d/c'ed due to somnolence.  Patient has done well without them so far.   5. L femur frx: Pt with fall  02/10/08 with L femur frx; s/p ORIF 02/14/08 by Dr Shon Baton. No longer on coumadin. Repeat xray of leg per ortho showed separation at repair site. Pt no longer ambulatory. Rx ASA, Calcium, Vitamin D, Bisphosphonate.   6. Hypothyroidism: Rx Synthroid.   7. HTN: Rx Norvasc. BPs remain stable.   8. Nutrition/: Rx: Vitamin C, Beneprotein, Med Pass supplements, ST, dysphagia diet.   9. HLD: FLP 02/10/12: cholesterol 146, TG 151, HDL 39, LDL 77. Continue lipitor, consider recheck in 6 months to 1 year.  10. Dyspepsia: Rx Omeprazole.   11. CKD: Unchanged. Baseline Creatinine 1.15 (01/21/11).  Most recent Cr 1.17 (02/10/12).   12. Optho: Nuclear sclerosis both eyes, glaucoma. Recheck q 6 months.   13. NOTES: Patient has Evercare. DNR/DNI.   Review of Systems  SEE HPI.    Objective:  Vitals: wt: 225 (12/24) --> 210 (11/07/12) --> 211 (11/15/12) --> 215 (12/13/12) --> 207 (01/10/13) --> 205 (01/18/13) On 12/23/12 : Temp: 98.4, afebrile since last NH visit  BP: 143/77 (98-153/59-88) HR:  77 (63-103) RR:  15 (7-20) O2:  95% (94-98%) RA  Gen: NAD, alert and interactive, well appearing, sitting in her bed watching TV HEENT: MMM, EOMI CV: RRR, no murmurs Pulm: normal respiratory effort, breath sounds normal bilat, no wheezes noted Abd: soft, minimal diffuse tenderness to palpation without obvious bruise or swelling, obese Ext:  full ROM of bilateral hands and wrists, obvious wrist deformities, able to open and close hands entirely, no swelling or contractures noted, 3/5 grip  strength bilaterally   A/P: 79 yo F w/ multiple medical problems residing in nursing home -see problem list

## 2013-01-25 ENCOUNTER — Encounter: Payer: Self-pay | Admitting: Family Medicine

## 2013-01-25 NOTE — Assessment & Plan Note (Signed)
Off all narcotics since 12/19/12 from best I can tell in their MAR.  Son and pt feel like she is still well controlled on her Cymblata 30, Tylenol 1000mg  TID, gabapentin 900 TID.

## 2013-01-25 NOTE — Assessment & Plan Note (Signed)
Doing well with slightly lower seroquel dose (75mg  qd, every day except Wed when dose is 62.5mg ); also on cymbalta 30mg .

## 2013-01-25 NOTE — Assessment & Plan Note (Signed)
No overt symptoms. Last TSH was 03/2012, could consider repeating yearly to ensure no need for dose change, especially since patient has significant dementia and is not able to relay complaints well.

## 2013-01-25 NOTE — Assessment & Plan Note (Signed)
Weight trending down, now 205#.  Last A1c 06/2012.

## 2013-01-25 NOTE — Assessment & Plan Note (Signed)
Remains relatively well controlled. No changed. Last BMET 06/2012.

## 2013-01-26 NOTE — Progress Notes (Signed)
Yes, she should have an annual TSH. We'll check to make sure that it is already ordered.

## 2013-01-30 ENCOUNTER — Encounter: Payer: Self-pay | Admitting: Pharmacist

## 2013-02-05 ENCOUNTER — Encounter: Payer: Self-pay | Admitting: Family Medicine

## 2013-02-07 ENCOUNTER — Non-Acute Institutional Stay (INDEPENDENT_AMBULATORY_CARE_PROVIDER_SITE_OTHER): Payer: Self-pay | Admitting: Family Medicine

## 2013-02-07 DIAGNOSIS — N19 Unspecified kidney failure: Secondary | ICD-10-CM

## 2013-02-07 DIAGNOSIS — M25572 Pain in left ankle and joints of left foot: Secondary | ICD-10-CM

## 2013-02-07 DIAGNOSIS — M25579 Pain in unspecified ankle and joints of unspecified foot: Secondary | ICD-10-CM

## 2013-02-07 DIAGNOSIS — E559 Vitamin D deficiency, unspecified: Secondary | ICD-10-CM

## 2013-02-07 HISTORY — DX: Pain in unspecified ankle and joints of unspecified foot: M25.579

## 2013-02-07 NOTE — Assessment & Plan Note (Signed)
Cr continues to be at baseline

## 2013-02-07 NOTE — Assessment & Plan Note (Signed)
Left foot pain, unclear if acute, seems to be bilateral on exam at times with left pain greater than right.  No focal.  Possibly due to edema.  Is no longer on colchicine.  Will continue baseline chronic pain meds, and supportive care for edema with elevation and compression hose.  Will continue to monitor.

## 2013-02-07 NOTE — Progress Notes (Signed)
  Subjective:    Patient ID: KAAREN NASS, female    DOB: 03/09/1933, 79 y.o.   MRN: [id number]  HPI 79 yo Nursing Home resident seen at [facility] for routine follow-up  Was seen last week 3/31 for left foot pain and swelling.  Was given colchicine x 2 doses for history of gout.  Was not classic presentation at that time.  Uric acid drawn was normal.  Open toed compression stockings placed on feet and order written to elevate legs.  Patient reports bilateral foot pain, left greater than right.   Review of Systems see HPI     Objective:   Physical Exam  GEN: Alert, No acute distress, sitting upright in wheelchair, poor historian CV:  Regular Rate & Rhythm, no murmur Respiratory:  Normal work of breathing, CTAB Abd:  + BS, soft, no tenderness to palpation Ext: legs dependent in upright sitting position with ted hose on.  Bilateral feet 1+ edema.  No calf edema.  Tender to palpation.  No erythema.  No focal tenderness.        Assessment & Plan:

## 2013-02-07 NOTE — Assessment & Plan Note (Signed)
Is on vitamind 50K qmonthly.  Also on calcium citrate.  Would consider rechecking Vitamin D at next blood draw.

## 2013-02-22 ENCOUNTER — Encounter: Payer: Self-pay | Admitting: Pharmacist

## 2013-03-08 ENCOUNTER — Encounter: Payer: Self-pay | Admitting: Family Medicine

## 2013-03-08 ENCOUNTER — Non-Acute Institutional Stay (INDEPENDENT_AMBULATORY_CARE_PROVIDER_SITE_OTHER): Payer: Self-pay | Admitting: Family Medicine

## 2013-03-08 DIAGNOSIS — E785 Hyperlipidemia, unspecified: Secondary | ICD-10-CM

## 2013-03-08 DIAGNOSIS — E669 Obesity, unspecified: Secondary | ICD-10-CM

## 2013-03-08 DIAGNOSIS — E039 Hypothyroidism, unspecified: Secondary | ICD-10-CM

## 2013-03-08 DIAGNOSIS — G894 Chronic pain syndrome: Secondary | ICD-10-CM

## 2013-03-08 NOTE — Assessment & Plan Note (Signed)
Weight up 12lb from March to April.  Will wait to see May weight before drawing conclusions.  Does not appear fluid overloaded today.

## 2013-03-08 NOTE — Progress Notes (Signed)
Subjective:  No family with her today.  In the hair salon getting her hair done.  Still complains of some pain in legs.   Per nursing, she is still having some hallucinations, mostly of people doing things, but she does not get agitated or act on these hallucinations. She does still complain of her feet hurting, but seems to have improved in the past few weeks.    Chronic Issues:  1. Hemorrhoids: Rx Miralax, Tucks, Hydrocortisone.   2. Schizophrenia / Dementia: Rx Seroquel, Aricept, Cymbalta.  Psych previously reccommended decreasing Cymbalta and Trazodone and starting Klonopin. Would like to avoid benzos.  Decreased Cymbalta 03/22/12 to 30mg  daily. MMSE in 07/2011: 8/30.   3. Chronic cystitis: Pt with Hx multiple UTIs. Followed by [facility]. Do not get UA unless systemic symptoms.   4. Chronic Pain: Dx Gout, Back Pain, Osteoarthritis, Hx PMR - Continues to c/o pain chronically but has not needed any PRN Oxycodone 5mg . On Tylenol, Gabapentin, Oxycontin, Cymbalta, lidoderm patch 5%. Weaned from prednisone with no change in function. Increased gabapentin to 300 TID with improved pain control and no significant side effects; will continue. MONITOR FOR INCREASED PAIN after decreasing Cymbalta on 03/22/12 --> as of Nov 2013, pain still under good control. NOTE: PATIENT HAS CHRONIC RIGHT HAND PAIN.This is thought to be 2/2 to Cervical Spondylosis with myelopathy. Dr. Sheffield Slider suspects this is the main cause of her quadriparetic state. She is not a surgical candidate due to prolonged myelopathy. AS OF 12/2012, all narcotics were d/c'ed due to somnolence.  Patient has done well without them so far.   5. L femur frx: Pt with fall 02/10/08 with L femur frx; s/p ORIF 02/14/08 by Dr Shon Baton. No longer on coumadin. Repeat xray of leg per ortho showed separation at repair site. Pt no longer ambulatory. Rx ASA, Calcium, Vitamin D, Bisphosphonate.   6. Hypothyroidism: Rx Synthroid.   7. HTN: Rx Norvasc. BPs remain  stable.   8. Nutrition/: Rx: Vitamin C, Beneprotein, Med Pass supplements, ST, dysphagia diet.   9. HLD: FLP 02/10/12: cholesterol 146, TG 151, HDL 39, LDL 77. Continue lipitor, consider recheck in 6 months to 1 year.  10. Dyspepsia: Rx Omeprazole.   11. CKD: Unchanged. Baseline Creatinine 1.15 (01/21/11).  Most recent Cr 1.17 (02/10/12).   12. Optho: Nuclear sclerosis both eyes, glaucoma. Recheck q 6 months.   13. NOTES: Patient has Evercare. DNR/DNI.   Review of Systems  SEE HPI.    Objective:  Vitals: wt: 215 (12/13/12) --> 207 (01/10/13) --> 205 (01/18/13) --> 217 (02/10/13) On 02/24/13 : Temp: 97.1, afebrile since last NH visit  BP: 144/86 (110-144/68-86) HR:  76 (66-79) RR:  19 (18-19) O2:   93-100% RA  Gen: NAD, alert and interactive, well appearing, sitting in salon  Rest of PE deferred.    A/P: 79 yo F w/ multiple medical problems residing in nursing home -see problem list

## 2013-03-08 NOTE — Assessment & Plan Note (Signed)
Weight increased in past 1 month by 12lb, ?accurateness. Will monitor.  Does not appear to be fluid overloaded. Continue lipitor.

## 2013-03-08 NOTE — Assessment & Plan Note (Signed)
Doing better, had some increased pain 4-6 weeks ago but is complaining less.

## 2013-03-08 NOTE — Assessment & Plan Note (Signed)
TSH 01/2013 WNL, continue current synthroid dose.

## 2013-04-25 ENCOUNTER — Non-Acute Institutional Stay (INDEPENDENT_AMBULATORY_CARE_PROVIDER_SITE_OTHER): Payer: Self-pay | Admitting: Family Medicine

## 2013-04-25 DIAGNOSIS — E669 Obesity, unspecified: Secondary | ICD-10-CM

## 2013-04-25 DIAGNOSIS — F0390 Unspecified dementia without behavioral disturbance: Secondary | ICD-10-CM

## 2013-04-25 DIAGNOSIS — G894 Chronic pain syndrome: Secondary | ICD-10-CM

## 2013-04-25 NOTE — Progress Notes (Signed)
  Subjective:    [name], female    DOB: [birth year], 78 y.o.   MRN: [id number]  HPI Pain - says her left leg hurts so she can't walk  Dementia - No behavior problems per staff. Sitting in activity room "playing" bingo Review of Systems     Objective:   Physical Exam  Constitutional:  Generalized obesity   Cardiovascular: Normal rate and regular rhythm.   No murmur heard. Pulmonary/Chest: Effort normal and breath sounds normal.  Abdominal: Soft. Bowel sounds are normal. She exhibits no mass. There is no tenderness. There is no rebound and no guarding.  Musculoskeletal: She exhibits edema.  Trace ankle edema  Atrophy of hand and forearm musculature, greater on the left   Lidoderm patch on right wrist  Neurological: She is alert. No cranial nerve deficit.  Weak dorsiflexion of wrists Only partially able to spread fingers.  DTR's 1+ or not elicited.  Skin: Skin is warm and dry.  Psychiatric:  Pleasant and cooperative, but responses non-sensical          Assessment & Plan:

## 2013-04-25 NOTE — Assessment & Plan Note (Signed)
Well controlled 

## 2013-04-25 NOTE — Assessment & Plan Note (Signed)
Stable weight.  

## 2013-04-25 NOTE — Assessment & Plan Note (Signed)
unchanged

## 2013-05-31 ENCOUNTER — Encounter: Payer: Self-pay | Admitting: Family Medicine

## 2013-05-31 NOTE — Progress Notes (Signed)
I spoke to [employer] with [employer] [facility] regarding consultation with the patient's family about gradual dose reduction of Seroquel starting with her Monday night dose. I am agreeable to this as patient has significant polypharmacy. I have DC'd her Monday night dose of Seroquel.

## 2013-06-04 ENCOUNTER — Other Ambulatory Visit: Payer: Self-pay | Admitting: Family Medicine

## 2013-06-04 MED ORDER — QUETIAPINE FUMARATE 25 MG PO TABS: ORAL_TABLET | ORAL | Status: DC

## 2013-06-04 NOTE — Telephone Encounter (Signed)
Tapering down on seroquel my decreasing AM doses. Most recent decrease was from 75 mg BID on Monday. To 62.5 mg in AM and 75 mg in PM.

## 2013-06-13 ENCOUNTER — Non-Acute Institutional Stay: Payer: Self-pay | Admitting: Family Medicine

## 2013-06-13 DIAGNOSIS — J449 Chronic obstructive pulmonary disease, unspecified: Secondary | ICD-10-CM

## 2013-06-13 DIAGNOSIS — I10 Essential (primary) hypertension: Secondary | ICD-10-CM

## 2013-06-13 DIAGNOSIS — D649 Anemia, unspecified: Secondary | ICD-10-CM

## 2013-06-13 DIAGNOSIS — E039 Hypothyroidism, unspecified: Secondary | ICD-10-CM

## 2013-06-13 DIAGNOSIS — G4700 Insomnia, unspecified: Secondary | ICD-10-CM

## 2013-06-13 NOTE — Progress Notes (Signed)
Subjective:     [name], female   DOB: 07-20-1933, 79 y.o.   MRN: [id number]  HPI 79 year old female with past medical history of schizophrenia, dementia, polymyalgia rheumatica seen and evaluated at [facility] nursing home on 06/13/2013. Patient was in conjunction with Dr. Madelon Lips pharmacist for [facility] family practice. Patient has complaints of pain all over specifically in her joints as well as epigastric area, states that her pain is "9/10 ", she also states that she is having trouble sleeping at night however she is unable to relate if this is related to her chronic pain or anxiety. Per nursing staff no acute issues. Patient is tolerating diet, having regular bowel movements, patient  Does admit to epigas.tric pain, nausea, no vomiting. Per nursing staff previous sacral decubitus ulcer has resolved. Skin is intact per nursing staff. Patient is not a current smoker.  Review of Systems  Constitutional: Negative for fever and fatigue.  Respiratory: Negative for cough and wheezing.   Cardiovascular: Positive for leg swelling. Negative for chest pain.  Gastrointestinal: Negative for nausea, vomiting, diarrhea and constipation.       Objective:   Physical Exam vitals: Reviewed as per above, a set of vital signs identified slightly elevated blood pressure which has been previously well controlled General: African American female, sitting in wheelchair, watching TV, no acute distress Cardiac: Regular rate and rhythm, no murmurs gallops or rubs, no heaves or thrills Respiratory: Good auscultation bilaterally, poor effort Abdominal: Soft, positive bowel sounds all 4 quadrants, mild diffuse tenderness, no rebound or guarding Extremities: Compression stockings over bilateral legs, trace edema  Recent laboratory work performed on 05/22/2013 is reviewed: CBC showed WBC count of 6.2, hemoglobin of 12.2, hematocrit 37.1, platelets of 191; lipid panel showed total cholesterol of  161, triglycerides 142, HDL 42, LDL of 91     Assessment:     79 year old female with past medical history includes schizophrenia, dementia, gout, and polymyalgia rheumatica seen and evaluated at [facility] nursing home.  Plan:     *Please see problem specific assessment and plan    **

## 2013-06-13 NOTE — Assessment & Plan Note (Signed)
COPD is stable on current medication regimen. Currently on Pulmicort and Brovana.

## 2013-06-13 NOTE — Assessment & Plan Note (Signed)
Patient continues to have insomnia despite use of trazodone at nighttime. Given history of dementia would like to avoid other benzodiazepine-type medications such as open. We'll continue to monitor. No further recommendations at this time.

## 2013-06-13 NOTE — Assessment & Plan Note (Signed)
Blood pressure was slightly elevated on the last vitals check. Blood pressure at that time was 149/76. Previous blood pressures have been within normal range. We'll continue current regimen of amlodipine 10 mg daily. -Continue to monitor vitals, blood pressure continues to be elevated will consider addition of a second agent

## 2013-06-13 NOTE — Assessment & Plan Note (Signed)
TSH stable per most recent level on 01/2013 of 1.186. Consider repeat TSH 6 months from previous lab draw.

## 2013-06-13 NOTE — Assessment & Plan Note (Signed)
Hemoglobin stable, most recent hemoglobin in July of 2014 was 12.2

## 2013-06-26 ENCOUNTER — Encounter: Payer: Self-pay | Admitting: Pharmacist

## 2013-06-26 NOTE — Progress Notes (Signed)
[name], female   DOB: [birth year], 79 y.o.   MRN: [id number] Reviewed nursing home medication list.

## 2013-07-12 ENCOUNTER — Encounter: Payer: Self-pay | Admitting: Sports Medicine

## 2013-07-12 LAB — LIPID PANEL
HDL: 42 mg/dL (ref 35–70)
LDL (calc): 91
Triglycerides: 142

## 2013-07-18 ENCOUNTER — Non-Acute Institutional Stay: Payer: Medicare Other | Admitting: Family Medicine

## 2013-07-18 ENCOUNTER — Emergency Department (HOSPITAL_COMMUNITY): Payer: Medicare Other

## 2013-07-18 ENCOUNTER — Encounter (HOSPITAL_COMMUNITY): Payer: Self-pay | Admitting: Emergency Medicine

## 2013-07-18 ENCOUNTER — Emergency Department (HOSPITAL_COMMUNITY)
Admission: EM | Admit: 2013-07-18 | Discharge: 2013-07-18 | Disposition: A | Discharge status: Skilled Nursing Facility | Payer: Medicare Other | Attending: Emergency Medicine | Admitting: Emergency Medicine

## 2013-07-18 DIAGNOSIS — J441 Chronic obstructive pulmonary disease with (acute) exacerbation: Secondary | ICD-10-CM | POA: Insufficient documentation

## 2013-07-18 DIAGNOSIS — E785 Hyperlipidemia, unspecified: Secondary | ICD-10-CM | POA: Insufficient documentation

## 2013-07-18 DIAGNOSIS — F0390 Unspecified dementia without behavioral disturbance: Secondary | ICD-10-CM | POA: Insufficient documentation

## 2013-07-18 DIAGNOSIS — N390 Urinary tract infection, site not specified: Secondary | ICD-10-CM | POA: Insufficient documentation

## 2013-07-18 DIAGNOSIS — Z7982 Long term (current) use of aspirin: Secondary | ICD-10-CM | POA: Insufficient documentation

## 2013-07-18 DIAGNOSIS — Z8679 Personal history of other diseases of the circulatory system: Secondary | ICD-10-CM | POA: Insufficient documentation

## 2013-07-18 DIAGNOSIS — K219 Gastro-esophageal reflux disease without esophagitis: Secondary | ICD-10-CM | POA: Insufficient documentation

## 2013-07-18 DIAGNOSIS — Z9981 Dependence on supplemental oxygen: Secondary | ICD-10-CM | POA: Insufficient documentation

## 2013-07-18 DIAGNOSIS — E039 Hypothyroidism, unspecified: Secondary | ICD-10-CM | POA: Insufficient documentation

## 2013-07-18 DIAGNOSIS — Z79899 Other long term (current) drug therapy: Secondary | ICD-10-CM | POA: Insufficient documentation

## 2013-07-18 DIAGNOSIS — J189 Pneumonia, unspecified organism: Secondary | ICD-10-CM

## 2013-07-18 DIAGNOSIS — Z87448 Personal history of other diseases of urinary system: Secondary | ICD-10-CM | POA: Insufficient documentation

## 2013-07-18 DIAGNOSIS — F209 Schizophrenia, unspecified: Secondary | ICD-10-CM | POA: Insufficient documentation

## 2013-07-18 DIAGNOSIS — Z8781 Personal history of (healed) traumatic fracture: Secondary | ICD-10-CM | POA: Insufficient documentation

## 2013-07-18 DIAGNOSIS — J449 Chronic obstructive pulmonary disease, unspecified: Secondary | ICD-10-CM

## 2013-07-18 DIAGNOSIS — I129 Hypertensive chronic kidney disease with stage 1 through stage 4 chronic kidney disease, or unspecified chronic kidney disease: Secondary | ICD-10-CM | POA: Insufficient documentation

## 2013-07-18 DIAGNOSIS — G8929 Other chronic pain: Secondary | ICD-10-CM | POA: Insufficient documentation

## 2013-07-18 DIAGNOSIS — M109 Gout, unspecified: Secondary | ICD-10-CM | POA: Insufficient documentation

## 2013-07-18 DIAGNOSIS — N189 Chronic kidney disease, unspecified: Secondary | ICD-10-CM | POA: Insufficient documentation

## 2013-07-18 DIAGNOSIS — F411 Generalized anxiety disorder: Secondary | ICD-10-CM | POA: Insufficient documentation

## 2013-07-18 LAB — URINALYSIS, ROUTINE W REFLEX MICROSCOPIC
Glucose, UA: NEGATIVE mg/dL
Ketones, ur: NEGATIVE mg/dL
Protein, ur: 100 mg/dL — AB

## 2013-07-18 LAB — COMPREHENSIVE METABOLIC PANEL
AST: 16 U/L (ref 0–37)
Albumin: 3.3 g/dL — ABNORMAL LOW (ref 3.5–5.2)
Alkaline Phosphatase: 67 U/L (ref 39–117)
BUN: 33 mg/dL — ABNORMAL HIGH (ref 6–23)
Chloride: 103 mEq/L (ref 96–112)
Potassium: 5.4 mEq/L — ABNORMAL HIGH (ref 3.5–5.1)
Sodium: 137 mEq/L (ref 135–145)
Total Bilirubin: 0.2 mg/dL — ABNORMAL LOW (ref 0.3–1.2)
Total Protein: 6.9 g/dL (ref 6.0–8.3)

## 2013-07-18 LAB — URINE MICROSCOPIC-ADD ON

## 2013-07-18 LAB — CBC WITH DIFFERENTIAL/PLATELET
Basophils Absolute: 0 10*3/uL (ref 0.0–0.1)
Eosinophils Absolute: 0.2 10*3/uL (ref 0.0–0.7)
Eosinophils Relative: 3 % (ref 0–5)
HCT: 40.2 % (ref 36.0–46.0)
Lymphocytes Relative: 34 % (ref 12–46)
Lymphs Abs: 1.9 10*3/uL (ref 0.7–4.0)
MCH: 30 pg (ref 26.0–34.0)
MCV: 96.4 fL (ref 78.0–100.0)
Monocytes Absolute: 0.5 10*3/uL (ref 0.1–1.0)
Platelets: 184 10*3/uL (ref 150–400)
RDW: 16.1 % — ABNORMAL HIGH (ref 11.5–15.5)
WBC: 5.6 10*3/uL (ref 4.0–10.5)

## 2013-07-18 MED ORDER — CEPHALEXIN 250 MG PO CAPS: 250.0000 mg | ORAL_CAPSULE | Freq: Four times a day (QID) | ORAL | Status: DC

## 2013-07-18 MED ORDER — DEXTROSE 5 % IV SOLN
1.0000 g | Freq: Once | INTRAVENOUS | Status: AC
  Administered 2013-07-18: 1 g via INTRAVENOUS
  Filled 2013-07-18: qty 10

## 2013-07-18 NOTE — ED Notes (Signed)
Pt brought to ED by EMS with SOB which started few months ago.

## 2013-07-18 NOTE — ED Notes (Signed)
[facility] contacted for transport 

## 2013-07-18 NOTE — Assessment & Plan Note (Signed)
79 year old female with healthcare associated pneumonia with worsening respiratory status despite treatment with Levaquin, oxygen, and nebulizers. She currently meets sepsis criteria. -Patient will be transferred to [facility] emergency room for further evaluation and likely admission to the family practice inpatient service -Patient did not receive blood cultures prior to initiation of antibiotic treatment, would consider blood cultures at time of the ER visit -Would recommend increased antibiotic coverage with Zosyn and vancomycin

## 2013-07-18 NOTE — Progress Notes (Signed)
  Subjective:    [name], female    DOB: 26-Oct-1933, 79 y.o.   MRN: [id number]  HPI 79 year old [place] American female seen and evaluated at [facility] nursing home. Patient has been having increased shortness of breath for the past couple of days, she had recent chest x-ray that showed bilateral infiltrates suspicious for pneumonia, she was started on Levaquin on 07/16/2013, she has been receiving supplemental oxygen, patient is unable to provide much past medical history due to history of dementia, per nursing staff her respiratory status has been slowly declining, minimal improvement with albuterol treatments, she was seen this morning with Dr. Kirby Funk (resident physician) for followup of her respiratory status, per Dr. Margot Ables she appears to be clinically declining   Review of Systems  Constitutional: Negative for fever and chills.  Respiratory: Positive for cough and wheezing.   Cardiovascular: Negative for chest pain.       Objective:   Physical Exam Vitals: Reviewed General: Pleasant alert female, appears in respiratory distress Cardiac: Regular rate and rhythm, S1 and S2 present, no murmurs appreciated Respiratory: Decreased breath sounds bilaterally, rhonchi and crackles present in bilateral bases Abdomen: Soft, nontender, bowel sounds present Extremities: Trace edema       Assessment & Plan:  Please see problem specific assessment and plan.

## 2013-07-18 NOTE — ED Notes (Addendum)
Report given to Vita Erm, LPN; [facility]

## 2013-07-18 NOTE — ED Provider Notes (Signed)
CSN: [id number]     Arrival date & time 07/18/13  1112 History   First MD Initiated Contact with Patient 07/18/13 1115     Chief Complaint  Patient presents with  . Shortness of Breath   (Consider location/radiation/quality/duration/timing/severity/associated sxs/prior Treatment) HPI Patient brought to the emergency department from her nursing home by EMS for an episode of shortness of breath after eating last night and a drop in her oxygen saturations into the mid 80s.patient has a history of COPD and is currently on 2 L of oxygen by nasal cannula chronically. Patient has a history of schizophrenia and dementia. She is unable to contribute to her history. Level V caveat applies.. Past Medical History  Diagnosis Date  . Schizophrenia     Rx Seroquel, Aricept, Cymbalta. MMSE 22/30.  Marland Kitchen Dementia   . Hyperlipidemia   . Depression   . GERD (gastroesophageal reflux disease)   . Chronic kidney disease (CKD)     Creatinine 1.15 (01/21/11).  . Hypothyroid   . Chronic pain     Dx Gout, Back Pain, Hx PMR - Continues to c/o pain chronically. On Tylenol, Gabapentin, and Oxycontin.  Weaned from prednisone with no change in function. Now in OT 3x/week. NOTE: PATIENT HAS CHRONIC RIGHT HAND PAIN.  Marland Kitchen Hemorrhoids     Rx Miralax, Tucks, Hydrocortisone.   . Cystitis, chronic     Pt with Hx multiple UTIs. Followed by [facility]. Do not get UA unless systemic symptoms.  . Femur fracture, left     Pt with fall 4/409 with L femur frx; s/p ORIF 02/14/08 by Dr Shon Baton.  No longer on coumadin. Repeat xray of leg per ortho showed separation at repair site. Pt no longer ambulatory. Rx ASA, Calcium, Vitamin D, Bisphosphonate.  . Hypothyroid     Rx Synthroid.  Marland Kitchen Hypertension     Rx Norvasc.  Marland Kitchen Dysphagia   . Fracture, humerus, proximal 08/2009    Non-union   Past Surgical History  Procedure Laterality Date  . Total abdominal hysterectomy      left  . Orif hip fracture  2009    leftf  . Humeral  arthroplasty  08/15/09    left  . Posterior laminectomy / decompression lumbar spine      L 1-2  . Umbilical hernia repair     No family history on file. History  Substance Use Topics  . Smoking status: Never Smoker   . Smokeless tobacco: Never Used  . Alcohol Use: No   OB History   Grav Para Term Preterm Abortions TAB SAB Ect Mult Living                 Review of Systems  Unable to perform ROS: Dementia    Allergies  Hydrochlorothiazide  Home Medications   Current Outpatient Rx  Name  Route  Sig  Dispense  Refill  . acetaminophen (TYLENOL) 500 MG tablet   Oral   Take 2 tablets (1,000 mg total) by mouth 3 (three) times daily.         Marland Kitchen allopurinol (ZYLOPRIM) 300 MG tablet   Oral   Take 1 tablet (300 mg total) by mouth daily.         Marland Kitchen amLODipine (NORVASC) 10 MG tablet   Oral   Take 1 tablet (10 mg total) by mouth daily.         Marland Kitchen arformoterol (BROVANA) 15 MCG/2ML NEBU   Nebulization   Take 2 mLs (15 mcg total) by nebulization  2 (two) times daily.         Marland Kitchen aspirin 81 MG chewable tablet   Oral   Chew 81 mg by mouth daily.         Marland Kitchen atorvastatin (LIPITOR) 10 MG tablet   Oral   Take 1 tablet (10 mg total) by mouth at bedtime.         . budesonide (PULMICORT) 0.25 MG/2ML nebulizer solution   Nebulization   Take 2 mLs (0.25 mg total) by nebulization 2 (two) times daily. Twice a day prior to brovana         . Cholecalciferol 50000 UNITS capsule   Oral   Take 50,000 Units by mouth every 30 (thirty) days. Given on 14th of month.         . colchicine 0.6 MG tablet   Oral   Take 1 tablet (0.6 mg total) by mouth daily.         . cycloSPORINE (RESTASIS) 0.05 % ophthalmic emulsion   Both Eyes   Place 1 drop into both eyes 2 (two) times daily.          Marland Kitchen donepezil (ARICEPT) 10 MG tablet   Oral   Take 1 tablet (10 mg total) by mouth at bedtime.         . DULoxetine (CYMBALTA) 30 MG capsule   Oral   Take 1 capsule (30 mg total) by mouth  daily.         Marland Kitchen gabapentin (NEURONTIN) 300 MG tablet   Oral   Take 900 mg by mouth 3 (three) times daily.          . GuaiFENesin (MUCINEX FOR KIDS PO)   Oral   Take 500 mg by mouth 2 (two) times daily. Kids Mini-melts- Give 1 pack BID.         Marland Kitchen ipratropium-albuterol (DUONEB) 0.5-2.5 (3) MG/3ML SOLN   Nebulization   Take 3 mLs by nebulization every 6 (six) hours as needed (for shortness of breath).          Marland Kitchen levothyroxine (SYNTHROID, LEVOTHROID) 50 MCG tablet   Oral   Take 1 tablet (50 mcg total) by mouth daily.         Marland Kitchen loratadine (CLARITIN) 10 MG tablet   Oral   Take 10 mg by mouth daily.         Marland Kitchen LORazepam (ATIVAN) 0.5 MG tablet   Oral   Take 0.25 mg by mouth daily as needed for anxiety.         . Multiple Vitamin (MULTIVITAMIN) tablet   Oral   Take 1 tablet by mouth daily.          Marland Kitchen omeprazole (PRILOSEC) 20 MG capsule   Oral   Take 20 mg by mouth daily.          . polyethylene glycol (GLYCOLAX) packet   Oral   Take 17 g by mouth every other day. Mix into 8 ounces fluid for constipation         . Propylene Glycol (SYSTANE BALANCE) 0.6 % SOLN   Ophthalmic   Apply 1 drop to eye 2 (two) times daily. Instill 1 drop to both eyes twice daily.         . QUEtiapine (SEROQUEL) 25 MG tablet   Oral   Take 25 mg by mouth every morning. Take 75mg  every morning except Wednesday for psychosis.         Marland Kitchen QUEtiapine (SEROQUEL) 25 MG tablet  Take 75mg  po BID except on Monday AM and Wednesday AM- take 62.5mg  on Mon and Wed AM         . traZODone (DESYREL) 50 MG tablet      Take 1/2 tablet (25 mg) by mouth if needed for sleep, then 50mg  1 hour later if ineffective.          BP 98/70  Pulse 68  Temp(Src) 98.8 F (37.1 C) (Oral)  Resp 22  SpO2 98% Physical Exam  Nursing note and vitals reviewed. Constitutional: She appears well-developed and well-nourished. No distress.  HENT:  Head: Normocephalic and atraumatic.  Mouth/Throat:  Oropharynx is clear and moist.  Eyes: EOM are normal. Pupils are equal, round, and reactive to light.  Neck: Normal range of motion. Neck supple.  Cardiovascular: Normal rate and regular rhythm.   Pulmonary/Chest: Effort normal and breath sounds normal. No respiratory distress. She has no wheezes. She has no rales. She exhibits no tenderness.  Abdominal: Soft. Bowel sounds are normal. She exhibits no distension and no mass. There is no tenderness. There is no rebound and no guarding.  Musculoskeletal: Normal range of motion. She exhibits no edema and no tenderness.  Swelling or edema.  Neurological: She is alert.  Patient is mildly anxious, and oriented to self. She moves all 4 extremities without deficit. Sensation is grossly intact.  Skin: Skin is warm and dry. No rash noted. No erythema.  Psychiatric:  Anxious.    ED Course  Procedures (including critical care time) Labs Review Labs Reviewed  COMPREHENSIVE METABOLIC PANEL  CBC WITH DIFFERENTIAL  PRO B NATRIURETIC PEPTIDE  TROPONIN I  URINALYSIS, ROUTINE W REFLEX MICROSCOPIC   Imaging Review No results found.  Date: 07/18/2013  Rate: 63  Rhythm: normal sinus rhythm  QRS Axis: normal  Intervals: PR prolonged  ST/T Wave abnormalities: nonspecific T wave changes  Conduction Disutrbances:first-degree A-V block   Narrative Interpretation:   Old EKG Reviewed: none available    MDM  ED RN spoke with nursing home RN to confirm history of hypoxia that prompted transfer to the emergency department. Patient has had her current mental baseline.   The patient's workup was negative for acute pulmonary findings. She is maintaining adequate oxygenation on 2 L nasal cannula oxygen. Her vital signs remained stable. Patient did have a urinary tract infection was treated with IV antibiotics and PT and will be discharged home on oral antibiotics. Patient will need to followup with her primary doctor to assure resolution. Return precautions  given.  Loren Racer, MD 07/18/13 [id number]

## 2013-07-19 ENCOUNTER — Non-Acute Institutional Stay: Payer: Medicare Other | Admitting: Family Medicine

## 2013-07-19 DIAGNOSIS — J189 Pneumonia, unspecified organism: Secondary | ICD-10-CM

## 2013-07-19 DIAGNOSIS — N19 Unspecified kidney failure: Secondary | ICD-10-CM

## 2013-07-19 DIAGNOSIS — E875 Hyperkalemia: Secondary | ICD-10-CM

## 2013-07-19 HISTORY — DX: Hyperkalemia: E87.5

## 2013-07-19 LAB — URINE CULTURE
Colony Count: NO GROWTH
Culture: NO GROWTH

## 2013-07-19 NOTE — Assessment & Plan Note (Signed)
Patient's creatinine is elevated to 1.62, likely secondary to acute dehydration from acute respiratory illness  -Will check BMP in 3-4 days to monitor

## 2013-07-19 NOTE — Assessment & Plan Note (Signed)
Workup performed in the emergency room was not consistent with pneumonia. Patient was discharged back to [facility] nursing home on 07/18/2013. -As her clinical picture initially appeared to be a pneumonia we'll complete a five-day course of Levaquin. -Will continue breathing treatments as needed.

## 2013-07-19 NOTE — Progress Notes (Signed)
  Subjective:    [name], female    DOB: 1933/09/22, 79 y.o.   MRN: [id number]  HPI 79 year old female sent for evaluation at [facility] emergency room on 07/18/2013. She was evaluated for increasing work of breathing and suspected pneumonia with sepsis. Patient was given one dose of Rocephin in the ER. Her oxygen saturation improved with supplemental oxygen. Lab work was obtained and outlined in the objective section. Patient was not felt to have acute pneumonia and was discharged back to [facility] nursing home.  Patient is doing well this morning. Having less shortness of breath , not requiring supplemental oxygen, tolerating diet, no nausea, no vomiting, no fevers, patient states that she "has a cold ", she has been coughing overnight with sputum production, it is difficult to obtain additional history due to patient's dementia   Review of Systems  Constitutional: Negative for fever and chills.  Respiratory: Positive for cough. Negative for wheezing.   Cardiovascular: Negative for chest pain.  Gastrointestinal: Negative for nausea, vomiting, diarrhea and constipation.       Objective:   Physical Exam Vitals: Not obtained this morning, nurses were asked to obtain  HEENT: Pupils are equal round reactive to light, extraocular movements were intact, moist mucous membranes Cardiac: Regular in rhythm, S1 and S2 present, no murmurs, no heaves or thrills Respiratory: Improved air entry in bilateral lung bases, bilateral rhonchi and slight crackles present in bilateral bases Abdomen: Soft, nontender, bowel sounds present Extremities: No edema  Reviewed emergency room encounter  Laboratory performed on 07/18/2013: Sodium 137, potassium 5.4, chloride 103, bicarbonate of 28, BUN 33, creatinine 1.62, LFTs within normal limits, CBC of 5.6, hemoglobin of 12.5, hematocrit 40.2, platelet 184, MCV of 96.4, troponin negative, BNP 79.8, UA showed moderate leukocyte esterase, negative  nitrates, WBCs 11-20, few bacteria, many squamous cells, urine culture pending  Chest x-ray performed in the emergency room showed bilateral atelectasis with evidence of poor effort       Assessment & Plan:  Please see problem specific assessment and plan. Patient was seen and evaluated with resident physician Dr. Margot Ables.

## 2013-07-19 NOTE — Assessment & Plan Note (Signed)
Creatinine slightly elevated 5.4 -Will recheck BMP in 3-4 days

## 2013-07-31 ENCOUNTER — Encounter: Payer: Self-pay | Admitting: Student-PharmD

## 2013-08-17 ENCOUNTER — Non-Acute Institutional Stay: Payer: Self-pay | Admitting: Sports Medicine

## 2013-08-17 ENCOUNTER — Encounter: Payer: Self-pay | Admitting: Sports Medicine

## 2013-08-17 DIAGNOSIS — J449 Chronic obstructive pulmonary disease, unspecified: Secondary | ICD-10-CM

## 2013-08-17 DIAGNOSIS — E669 Obesity, unspecified: Secondary | ICD-10-CM

## 2013-08-17 DIAGNOSIS — F0390 Unspecified dementia without behavioral disturbance: Secondary | ICD-10-CM

## 2013-08-17 DIAGNOSIS — M353 Polymyalgia rheumatica: Secondary | ICD-10-CM

## 2013-08-17 DIAGNOSIS — I10 Essential (primary) hypertension: Secondary | ICD-10-CM

## 2013-08-17 DIAGNOSIS — E875 Hyperkalemia: Secondary | ICD-10-CM

## 2013-08-17 DIAGNOSIS — H4010X0 Unspecified open-angle glaucoma, stage unspecified: Secondary | ICD-10-CM

## 2013-08-17 DIAGNOSIS — E039 Hypothyroidism, unspecified: Secondary | ICD-10-CM

## 2013-08-17 DIAGNOSIS — M109 Gout, unspecified: Secondary | ICD-10-CM

## 2013-08-17 DIAGNOSIS — N3941 Urge incontinence: Secondary | ICD-10-CM

## 2013-08-17 DIAGNOSIS — M4712 Other spondylosis with myelopathy, cervical region: Secondary | ICD-10-CM

## 2013-08-17 DIAGNOSIS — N390 Urinary tract infection, site not specified: Secondary | ICD-10-CM

## 2013-08-17 DIAGNOSIS — G2581 Restless legs syndrome: Secondary | ICD-10-CM

## 2013-08-17 DIAGNOSIS — M4716 Other spondylosis with myelopathy, lumbar region: Secondary | ICD-10-CM

## 2013-08-17 DIAGNOSIS — M1710 Unilateral primary osteoarthritis, unspecified knee: Secondary | ICD-10-CM

## 2013-08-17 DIAGNOSIS — Z593 Problems related to living in residential institution: Secondary | ICD-10-CM

## 2013-08-17 DIAGNOSIS — M25539 Pain in unspecified wrist: Secondary | ICD-10-CM

## 2013-08-17 DIAGNOSIS — G894 Chronic pain syndrome: Secondary | ICD-10-CM

## 2013-08-17 NOTE — Assessment & Plan Note (Signed)
Recently complicated by HCAP Last SpO2 = 88 - goal of 80 or higher otherwise need to consider oxygen

## 2013-08-17 NOTE — Progress Notes (Signed)
This encounter was created in error - please disregard.

## 2013-08-17 NOTE — Assessment & Plan Note (Addendum)
Last TSH previously stable Due for recheck in the middle of the month

## 2013-08-17 NOTE — Assessment & Plan Note (Signed)
Not a candidate for opioid pain medicines. Nonspecific complaining of anything except for abdominal discomfort. Nonfocal exam

## 2013-08-17 NOTE — Assessment & Plan Note (Signed)
Patient appears to be at baseline from prior evaluations.

## 2013-08-17 NOTE — Assessment & Plan Note (Signed)
Weight has been reported as stable per nursing home

## 2013-08-17 NOTE — Assessment & Plan Note (Signed)
Stable/Well Controlled - no changes at this time. 

## 2013-08-17 NOTE — Progress Notes (Signed)
[facility] OMAR ORREGO - 79 y.o. female MRN [id number]  Date of birth: [birth year]  CC, HPI, Interval History & ROS  Tammy Salinas is seen today at [facility] to establish care as her new PCP No acute Problems Was seen at hospital and subsequently treated for HCAP back at the nursing home last month  She reports having some abdominal pain.  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 Never Smoker   Smokeless tobacco  . Never Used   Health Maintenance Due  Topic  . Tetanus/tdap   . Zostavax   . Influenza Vaccine     Recent Labs  10/12/12 0836 05/22/13  HGBA1C 5.6  --   TRIG 90 142  CHOL 131 161  HDL 45 42  LDLCALC 68 91     Otherwise past Medical, Surgical, Social, and Family History Reviewed per EMR Medications and Allergies reviewed and all updated if necessary. Objective Findings  VITALS: Filed Vitals:   08/02/13 1200  BP: 130/81  Pulse: 72  Temp: 97.9 F (36.6 C)  Resp: 15  SpO2: 88%    BP Readings from Last 3 Encounters:  08/02/13 130/81  07/18/13 147/74  07/18/13 94/64   Wt Readings from Last 3 Encounters:  02/10/13 217 lb (98.431 kg)  01/18/13 205 lb (92.987 kg)  11/22/12 211 lb (95.709 kg)     PHYSICAL EXAM: GENERAL:  adult morbidly obese African American  female. In no discomfort; no respiratory distress  PSYCH:  asleep but awakens with questioning.  Inappropriate.  Does not answer questions appropriately.  Not oriented to place, time, situation   HNEENT:  no JVD, broad-based neck   CARDIO: RRR, S1/S2 heard, no murmur appreciated   LUNGS:  anterior and lateral lung fields without crackles, but poor air movement bilaterally   ABDOMEN: +BS, soft, diffusely tender, no rigidity, no guarding, markedly obese   EXTREM:  Warm, well perfused.  Moves all 4 extremities spontaneously; no lateralization.  No noted foot lesions.  Distal pulses 1+/ 4.  3+/4 pretibial edema.  GU:   SKIN:     Assessment & Plan  For further discussion of A/P and for follow up issues see problem based charting.

## 2013-08-27 ENCOUNTER — Encounter: Payer: Self-pay | Admitting: Pharmacist

## 2013-09-04 ENCOUNTER — Telehealth: Payer: Self-pay | Admitting: Emergency Medicine

## 2013-09-04 NOTE — Telephone Encounter (Signed)
Emergency Line Call Tammy Salinas called with CXR results.  CXR shows perivascular congestion; no infiltrates.  She has some chest congestion and her O2 has been dropping.  She is currently breathing comfortably and satting well on 2L of O2 by nasal cannula.

## 2013-09-05 ENCOUNTER — Telehealth: Payer: Self-pay | Admitting: Family Medicine

## 2013-09-05 NOTE — Telephone Encounter (Signed)
After hours Line: Spoke with Kendal Hymen LPN at [facility] about EKG that had been performed.  She faxed over a copy of the EKG and I personally compared this to her old EKG.  She has minor TWI in the high lateral leads that may be slightly more pronounced that previous, however, there is no gross ST elevation and basically unchanged since last EKG.  Spoke with Dr. Mikel Cella, Geriatric Resident, in regards to the patient, and will continue to monitor.  Twana First Paulina Fusi, DO of [facility] 09/05/2013, 6:29 PM

## 2013-09-26 ENCOUNTER — Non-Acute Institutional Stay: Payer: Medicare Other | Admitting: Family Medicine

## 2013-09-26 DIAGNOSIS — I10 Essential (primary) hypertension: Secondary | ICD-10-CM

## 2013-09-26 DIAGNOSIS — R0902 Hypoxemia: Secondary | ICD-10-CM

## 2013-09-26 DIAGNOSIS — R635 Abnormal weight gain: Secondary | ICD-10-CM

## 2013-09-27 ENCOUNTER — Encounter: Payer: Self-pay | Admitting: Family Medicine

## 2013-09-27 DIAGNOSIS — R634 Abnormal weight loss: Secondary | ICD-10-CM | POA: Insufficient documentation

## 2013-09-27 NOTE — Progress Notes (Signed)
Attending Progress Note  Subjective:    [name], female    DOB: July 31, 1933, 79 y.o.   MRN: [id number]  HPI 79 yo F NH patient seen for routine f/u  Acute events: new O2 requirement. Two weeks ago. CXR revealed perivascular congestion. BNP wnl. EKG relatively unchanged. ECHO wnl.  She also complained of some epigastric pain, so GERD was considered and her PPI dose was increased. Speech consult to evaluate swallowing was placed.   This AM she reports L leg pain that is intermittent. She had some last night. None now. She also reports intermittent CP and SOB.    Review of Systems As per HPI     Objective:   Physical Exam BP 156/89  Pulse 89  Temp(Src) 97.5 F (36.4 C)  Resp 20  Wt 219 lb 3.2 oz (99.428 kg)  SpO2 95% Wt Readings from Last 3 Encounters:  09/10/13 219 lb 3.2 oz (99.428 kg)  02/10/13 217 lb (98.431 kg)  01/18/13 205 lb (92.987 kg)  General appearance: alert, cooperative and no distress, lying recline in rolling chair, no distress  Throat: moist mucus membranes  Lungs: clear to auscultation bilaterally Heart: regular rate and rhythm, S1, S2 normal, no murmur, click, rub or gallop Abdomen: soft, non-tender; bowel sounds normal; no masses,  no organomegaly Extremities: trace edema on L, non on right. no skin redness or dimpling. no palpable cords.  Pulses: symmetrically decreased in the LEs but present  Skin: Skin color, texture, turgor normal. No rashes or lesions Neurologic: Grossly normal    Assessment & Plan:

## 2013-09-27 NOTE — Assessment & Plan Note (Signed)
A: above goal of  <150/90. Wt on the rise P: May need to add a second BP control agent, would prefer not to. BP checks twice weekly for next two weeks if elevated, low salt diet x two weeks and recheck.

## 2013-09-27 NOTE — Assessment & Plan Note (Addendum)
A: Wt is up. No evidence of fluid overload. Perhaps Seroquel side effect. No evidence of psychosis P: F/u seroquel dose, unclear on EPIC MAR. Possibly decrease seroquel dose.

## 2013-09-27 NOTE — Assessment & Plan Note (Signed)
A: desat have resolved. P: F/u speech evaluation.

## 2013-10-01 ENCOUNTER — Encounter: Payer: Self-pay | Admitting: Pharmacist

## 2013-10-10 ENCOUNTER — Other Ambulatory Visit: Payer: Self-pay | Admitting: Emergency Medicine

## 2013-10-12 ENCOUNTER — Non-Acute Institutional Stay (INDEPENDENT_AMBULATORY_CARE_PROVIDER_SITE_OTHER): Payer: Medicare Other | Admitting: Sports Medicine

## 2013-10-12 DIAGNOSIS — I10 Essential (primary) hypertension: Secondary | ICD-10-CM

## 2013-10-12 DIAGNOSIS — J449 Chronic obstructive pulmonary disease, unspecified: Secondary | ICD-10-CM

## 2013-10-12 DIAGNOSIS — Z593 Problems related to living in residential institution: Secondary | ICD-10-CM

## 2013-10-12 DIAGNOSIS — F0390 Unspecified dementia without behavioral disturbance: Secondary | ICD-10-CM

## 2013-10-12 DIAGNOSIS — I050 Rheumatic mitral stenosis: Secondary | ICD-10-CM

## 2013-10-12 NOTE — Progress Notes (Signed)
[facility] Tammy Salinas - 79 y.o. female MRN [id number]  Date of birth: 1933/04/01  CC, HPI, Interval History & ROS  Tammy Salinas is seen today at [facility] for PCP visit. No acute Problems  There are no specific nursing concerns at this time.  Her overall fluid status has remained stable  She underwent an echo on 09/12/2013 he cannot assess for systolic function.  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 Never Smoker   Smokeless tobacco  . Never Used   Health Maintenance Due  Topic  . Tetanus/tdap   . Zostavax   . Influenza Vaccine     Recent Labs  05/22/13  TRIG 142  CHOL 161  HDL 42  LDLCALC 91     Otherwise past Medical, Surgical, Social, and Family History Reviewed per EMR Medications and Allergies reviewed and all updated if necessary. Objective Findings  VITALS: There were no vitals filed for this visit.  BP Readings from Last 3 Encounters:  09/22/13 156/89  08/02/13 130/81  07/18/13 147/74   Wt Readings from Last 3 Encounters:  09/10/13 219 lb 3.2 oz (99.428 kg)  02/10/13 217 lb (98.431 kg)  01/18/13 205 lb (92.987 kg)     PHYSICAL EXAM: GENERAL: adult morbidly obese African American  female. In no discomfort; no respiratory distress  PSYCH:  asleep but awakens with questioning.  Inappropriate and tangential.  Does not answer  questions appropriately.  Not oriented to place, time, situation   HNEENT:  no JVD, broad-based neck   CARDIO: RRR, S1/S2 heard, no murmur appreciated   LUNGS:  anterior and lateral lung fields with coarse breath sounds no crackles.   Moderate air movement   ABDOMEN: +BS, soft, diffusely tender, no rigidity, no guarding, markedly obese   EXTREM:  Warm, well perfused.  Moves all 4 extremities spontaneously; no lateralization.  No noted foot lesions.  Distal pulses 1+/ 4.  1-2+/4 pretibial edema.  GU:   SKIN:     Previous Medications   ACETAMINOPHEN (TYLENOL) 500 MG TABLET    Take 2 tablets (1,000 mg total) by mouth 3 (three) times daily.   ALLOPURINOL (ZYLOPRIM) 300 MG TABLET    Take 1 tablet (300 mg total) by mouth daily.   AMLODIPINE (NORVASC) 10 MG TABLET    Take 1 tablet (10 mg total) by mouth daily.   ARFORMOTEROL (BROVANA) 15 MCG/2ML NEBU    Take 2 mLs (15 mcg total) by nebulization 2 (two) times daily.   ASPIRIN 81 MG CHEWABLE TABLET    Chew 81 mg by mouth daily.   ATORVASTATIN (LIPITOR) 10 MG TABLET    Take 1 tablet (10 mg total) by mouth at bedtime.   BUDESONIDE (PULMICORT) 0.25 MG/2ML NEBULIZER SOLUTION    Take 0.25 mg by nebulization 2 (two) times daily. prior to brovana   CHOLECALCIFEROL 50000 UNITS CAPSULE    Take 50,000 Units by mouth  every 30 (thirty) days. Given on 14th of month.   COLCHICINE 0.6 MG TABLET    Take 1 tablet (0.6 mg total) by mouth daily.   CYCLOSPORINE (RESTASIS) 0.05 % OPHTHALMIC EMULSION    Place 1 drop into both eyes 2 (two) times daily.    DONEPEZIL (ARICEPT) 10 MG TABLET    Take 1 tablet (10 mg total) by mouth at bedtime.   DULOXETINE (CYMBALTA) 30 MG CAPSULE    Take 1 capsule (30 mg total) by mouth daily.   GABAPENTIN (NEURONTIN) 300 MG TABLET    Take 900 mg by mouth 3 (three) times daily.    GUAIFENESIN (MUCINEX FOR KIDS PO)    Take 500 mg by mouth 2 (two) times daily. Kids Mini-melts- Give 1 pack BID.   LEVOTHYROXINE (SYNTHROID, LEVOTHROID) 50 MCG  TABLET    Take 1 tablet (50 mcg total) by mouth daily.   LORATADINE (CLARITIN) 10 MG TABLET    Take 10 mg by mouth daily.   LORAZEPAM (ATIVAN) 0.5 MG TABLET    Take 0.25 mg by mouth daily as needed for anxiety. Only between 9am and 6pm.   MULTIPLE VITAMIN (MULTIVITAMIN) TABLET    Take 1 tablet by mouth daily.    OMEPRAZOLE (PRILOSEC) 20 MG CAPSULE    Take 20 mg by mouth daily.    POLYETHYLENE GLYCOL (GLYCOLAX) PACKET    Take 17 g by mouth every other day. Mix into 8 ounces fluid for constipation   PROPYLENE GLYCOL (SYSTANE BALANCE) 0.6 % SOLN    Place 1 drop into both eyes 2 (two) times daily.    QUETIAPINE (SEROQUEL) 25 MG TABLET    Take 62.5-75 mg by mouth every morning. Take 75mg  every morning except 62.5 mg on Wednesday for psychosis.   TRAZODONE (DESYREL) 50 MG TABLET    Take 25-50 mg by mouth at bedtime as needed for sleep. Take 1/2 tablet (25 mg) by mouth if needed for sleep, then 50mg  1 hour later if ineffective.   Modified Medications   No medications on file   New Prescriptions   No medications on file   Discontinued Medications   No medications on file  No orders of the defined types were placed in this encounter.     Echocardiogram 09/12/2013: Grossly normal however they're unable to fully evaluate systolic ejection fraction. Trivial tricuspid regurgitation normal PAS P. 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

## 2013-10-22 DIAGNOSIS — I050 Rheumatic mitral stenosis: Secondary | ICD-10-CM | POA: Insufficient documentation

## 2013-10-22 NOTE — Assessment & Plan Note (Signed)
good air movement.  No acute concerns.

## 2013-10-22 NOTE — Assessment & Plan Note (Signed)
Nursing home visit

## 2013-10-22 NOTE — Assessment & Plan Note (Signed)
Echo results reviewed as above. Less helpful but likely some component of volume overload. Continue to monitor weights

## 2013-10-22 NOTE — Assessment & Plan Note (Signed)
Very low functioning status. No acute changes

## 2013-10-22 NOTE — Assessment & Plan Note (Signed)
Blood pressure values not available at time of exam.  Will followup Volume status appears to be improved from last visit

## 2013-11-27 ENCOUNTER — Encounter: Payer: Self-pay | Admitting: Pharmacist

## 2013-12-05 ENCOUNTER — Non-Acute Institutional Stay: Payer: Medicare Other | Admitting: Family Medicine

## 2013-12-05 DIAGNOSIS — F0390 Unspecified dementia without behavioral disturbance: Secondary | ICD-10-CM

## 2013-12-05 DIAGNOSIS — D649 Anemia, unspecified: Secondary | ICD-10-CM

## 2013-12-05 DIAGNOSIS — H6123 Impacted cerumen, bilateral: Secondary | ICD-10-CM

## 2013-12-05 DIAGNOSIS — R635 Abnormal weight gain: Secondary | ICD-10-CM

## 2013-12-05 DIAGNOSIS — I499 Cardiac arrhythmia, unspecified: Secondary | ICD-10-CM

## 2013-12-05 DIAGNOSIS — G2401 Drug induced subacute dyskinesia: Secondary | ICD-10-CM

## 2013-12-05 DIAGNOSIS — I4891 Unspecified atrial fibrillation: Secondary | ICD-10-CM | POA: Insufficient documentation

## 2013-12-05 DIAGNOSIS — I10 Essential (primary) hypertension: Secondary | ICD-10-CM

## 2013-12-05 DIAGNOSIS — N19 Unspecified kidney failure: Secondary | ICD-10-CM

## 2013-12-05 DIAGNOSIS — H6120 Impacted cerumen, unspecified ear: Secondary | ICD-10-CM

## 2013-12-05 NOTE — Assessment & Plan Note (Signed)
No recent episodes of tardive dyskinesia.

## 2013-12-05 NOTE — Assessment & Plan Note (Signed)
Weight currently stable. Most recently week on November 08, 2013 was 213 pounds. Continue to monitor clinically

## 2013-12-05 NOTE — Progress Notes (Signed)
   Subjective:    [name], female    DOB: 03/02/1933, 80 y.o.   MRN: [id number]  HPI 80 year old female with past medical history of hypertension, dementia, obesity, chronic pain was seen and evaluated at [facility]. She was seen and evaluated in the dining hall. Patient has multiple aches and pains including her bilateral hands and feet. Additional history of present illness is difficult to obtain secondary to severe dementia.  No acute issues identified by the nursing staff. No family present for further history of present illness.   Review of Systems  Constitutional: Negative for fever, chills and fatigue.  Respiratory: Negative for cough and shortness of breath.   Gastrointestinal: Negative for vomiting, abdominal pain, diarrhea, constipation and abdominal distention.       Objective:   Physical Exam Vitals: Reviewed General: Pleasant African American female, no acute distress, sitting in wheelchair HEENT: Pupils equal in size bilaterally, no scleral icterus, nasal rhinorrhea pleasant, bilateral TMs obscured by cerumen, moist mucous membranes, neck was supple Cardiac: Regular rhythm with occasional irregular beats, S1 and S2 present, no murmurs, no heaves or thrills Respiratory: Clear to auscultation bilaterally, normal effort Abdomen: Soft, nontender, bowel sounds present Extremities: Trace pedal edema in bilateral lower extremities Neuro: Patient oriented to self, unable to name place or location, unable to name current president  Reviewed lab work performed within the past 6 months     Assessment & Plan:  Please see problem specific assessment and plan.

## 2013-12-05 NOTE — Assessment & Plan Note (Signed)
Patient continues to have severe dementia. -Consider discontinuation of Aricept in the future if patient continues to show decline.

## 2013-12-05 NOTE — Assessment & Plan Note (Signed)
Hemoglobin stable based on most recent hemoglobin in October 2014.

## 2013-12-05 NOTE — Assessment & Plan Note (Signed)
Sinus rhythm with irregular beats. -Will check EKG

## 2013-12-05 NOTE — Assessment & Plan Note (Signed)
Blood pressure well controlled on Norvasc 10 mg daily

## 2013-12-05 NOTE — Assessment & Plan Note (Signed)
Acute renal failure resolved. Creatinine 1.02 in November of 2014.

## 2013-12-05 NOTE — Assessment & Plan Note (Signed)
Cerumen impaction bilateral ears identified -Prescribed Debrox drops for 5 days

## 2013-12-09 LAB — HEMOGLOBIN A1C: HEMOGLOBIN A1C: 5.6 % (ref 4.0–6.0)

## 2013-12-09 LAB — BASIC METABOLIC PANEL
BUN: 27 mg/dL — AB (ref 4–21)
CREATININE: 1.2 mg/dL — AB (ref <=1.1)
Glucose: 109 mg/dL
Potassium: 4.4 mmol/L (ref 3.4–5.3)
Sodium: 144 mmol/L (ref 137–147)

## 2013-12-09 LAB — LIPID PANEL
CHOLESTEROL: 174 mg/dL (ref 0–200)
HDL: 54 mg/dL (ref 35–70)
LDL CALC: 97 mg/dL
TRIGLYCERIDES: 116 mg/dL (ref 40–160)

## 2013-12-09 LAB — TSH: TSH: 1.49 u[IU]/mL (ref 0.41–5.90)

## 2013-12-09 LAB — CBC AND DIFFERENTIAL
HEMOGLOBIN: 13.4 g/dL (ref 12.0–16.0)
PLATELETS: 194 10*3/uL (ref 150–399)
WBC: 5.5 10^3/mL

## 2013-12-12 ENCOUNTER — Other Ambulatory Visit: Payer: Self-pay | Admitting: Family Medicine

## 2013-12-12 MED ORDER — ASPIRIN 81 MG PO CHEW: 324.0000 mg | CHEWABLE_TABLET | Freq: Every day | ORAL | Status: DC

## 2013-12-13 ENCOUNTER — Encounter: Payer: Self-pay | Admitting: Family Medicine

## 2013-12-18 ENCOUNTER — Telehealth: Payer: Self-pay | Admitting: Family Medicine

## 2013-12-18 DIAGNOSIS — I499 Cardiac arrhythmia, unspecified: Secondary | ICD-10-CM

## 2013-12-18 NOTE — Telephone Encounter (Signed)
I spoke with pt's son about atrial fibrillation. He denies knowing that this was a problem for his mother in the past. I explained the condition and the risks of the condition. I explained the risks and benefits of anti-coagulation for the condition. When asked if he had a preference, he said the he would like for the doctors to do what they "think is best." I stated that based on the risks of anti-coagulation, we would not start it but rather just use anti-platelet therapy. He was agreeable. He had no other concerns and questions and actually states that his mother looks better to him than she had ina long time.

## 2013-12-18 NOTE — Assessment & Plan Note (Signed)
Discussed situation with son including risks and benefits of anti-coagulation and he deferred to the medical team to do what we think is best for the patient. Her CHADSVas score is 6 more 19% risk of stroke per year. Her HAS BLED score is 5, making her at high risk for major bleeding. After discussing it with her PCP and the attending physician, it was decided that her risk of complication from anti-coagulant is greater than the benefit. Continue with ASA 324 mg daily for anti-platelet treatment.

## 2013-12-21 ENCOUNTER — Encounter: Payer: Self-pay | Admitting: Family Medicine

## 2013-12-21 LAB — BRAIN NATRIURETIC PEPTIDE: BNP: 124.6 pg/mL

## 2013-12-27 ENCOUNTER — Encounter: Payer: Self-pay | Admitting: Pharmacist

## 2013-12-27 ENCOUNTER — Non-Acute Institutional Stay (INDEPENDENT_AMBULATORY_CARE_PROVIDER_SITE_OTHER): Payer: Medicare Other | Admitting: Sports Medicine

## 2013-12-27 DIAGNOSIS — Z593 Problems related to living in residential institution: Secondary | ICD-10-CM

## 2013-12-27 DIAGNOSIS — I10 Essential (primary) hypertension: Secondary | ICD-10-CM

## 2013-12-27 DIAGNOSIS — I4891 Unspecified atrial fibrillation: Secondary | ICD-10-CM

## 2013-12-27 NOTE — Progress Notes (Signed)
Tammy Salinas - 80 y.o. female MRN [id number]  Date of birth: 1933-01-11  Tammy Salinas is a 80 y.o. female who was evaluated today at [facility] for follow up of her chronic medical problems.  CC & Problems Addressed: General Plan & Pt Instructions:  No chief complaint on file.     No changes in therapy at this time     SUBJECTIVE:  She is sitting comfortably watching TV.  No concerns reported per nursing.  Diagnosis of A. fib noted since last SNF visit; no anticoagulation. 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 # Chronic Pain: See overview  -previously became unresponsive so off opioids # Hypothyroidism:   Other Pertinent Med/Surg/Hosp History: # Hx of Gout # Recurrent UTI with incontinence # Hx of Anemia # Hx of Left hip Fx  # Umbilical Surgery # S/p Abdominal hysterectomy  Follow up Issues:  - follow up A Fib     Recent Labs  05/22/13 12/09/13  HGBA1C  --  5.6  TRIG 142 116  CHOL 161 174  HDL 42 54  LDLCALC 91 97  TSH  --  1.49   Wt Readings from Last 3 Encounters:  09/10/13 219 lb 3.2 oz (99.428 kg)  02/10/13 217 lb (98.431 kg)  01/18/13 205 lb (92.987 kg)   BP Readings from Last 3 Encounters:  12/31/13 125/66  12/01/13 115/71  09/22/13 156/89    History  Smoking status  . Never Smoker   Smokeless tobacco  . Never Used   Health Maintenance Due  Topic  . Tetanus/tdap   . Zostavax   . Influenza Vaccine     Otherwise past Medical, Surgical, Social, and Family History Reviewed per EMR Medications and Allergies reviewed and updated per below.  VITALS: Not available at this time.  Will followup, no vital sign instability reported  per nursing  PHYSICAL EXAM: GENERAL:  elderly African American debilitated, bedbound, female. In no discomfort; no respiratory distress  PSYCH:  minimally verbal, nods her head, poor eye contact,  Seen sitting up comfortably in the dining room the day prior more alert than normal.  HNEENT:  no JVD   CARDIO: RRR, S1/S2 heard, no murmur  LUNGS:  poor respiratory effort but clear to auscultation throughout in anterior and posterior lung fields.  ABDOMEN:  obese, +BS, soft, non-tender, no rigidity, no guarding, no masses/hepatosplenomegaly  EXTREM:  Warm, well perfused. distal pulses 1+/4.  trace pretibial edema.  GU:   SKIN:     MEDICATIONS, LABS & OTHER ORDERS: Previous Medications   ACETAMINOPHEN (TYLENOL) 500 MG TABLET    Take 2 tablets (1,000 mg total) by mouth 3 (three) times daily.   ALBUTEROL (PROVENTIL) (2.5 MG/3ML) 0.083% NEBULIZER SOLUTION    Take 2.5 mg by nebulization every 4 (four) hours as needed for shortness of breath.   ALLOPURINOL (ZYLOPRIM) 300 MG TABLET    Take 1 tablet (300 mg total) by mouth daily.   AMLODIPINE (NORVASC) 10 MG TABLET    Take 1 tablet (10 mg total) by mouth daily.   ARFORMOTEROL (BROVANA) 15 MCG/2ML NEBU    Take 2 mLs (15 mcg total) by nebulization 2 (two) times daily.   ASPIRIN EC 325 MG TABLET    Take  325 mg by mouth daily.   ATORVASTATIN (LIPITOR) 10 MG TABLET    Take 1 tablet (10 mg total) by mouth at bedtime.   BISACODYL (DULCOLAX) 10 MG SUPPOSITORY    Place 10 mg rectally daily as needed for moderate constipation.   BUDESONIDE (PULMICORT) 0.25 MG/2ML NEBULIZER SOLUTION    Take 0.25 mg by nebulization 2 (two) times daily. prior to brovana   CHOLECALCIFEROL 50000 UNITS CAPSULE    Take 50,000 Units by mouth every 30 (thirty) days. Given on 14th of month.   COLCHICINE 0.6 MG TABLET    Take 1 tablet (0.6 mg total) by mouth daily.   CYCLOSPORINE (RESTASIS) 0.05 % OPHTHALMIC EMULSION    Place 1 drop into both eyes 2 (two) times daily.    DONEPEZIL  (ARICEPT) 10 MG TABLET    Take 1 tablet (10 mg total) by mouth at bedtime.   DULOXETINE (CYMBALTA) 30 MG CAPSULE    Take 1 capsule (30 mg total) by mouth daily.   GABAPENTIN (NEURONTIN) 300 MG TABLET    Take 900 mg by mouth 3 (three) times daily.    GUAIFENESIN (MUCINEX FOR KIDS PO)    Take 500 mg by mouth 2 (two) times daily. Kids Mini-melts- Give 1 pack BID.   GUAIFENESIN (ROBITUSSIN) 100 MG/5ML LIQUID    Take 400 mg by mouth every 6 (six) hours as needed for cough.   LEVOTHYROXINE (SYNTHROID, LEVOTHROID) 50 MCG TABLET    Take 1 tablet (50 mcg total) by mouth daily.   LORATADINE (CLARITIN) 10 MG TABLET    Take 10 mg by mouth daily as needed.    LORAZEPAM (ATIVAN) 0.5 MG TABLET    Take 0.25 mg by mouth daily as needed for anxiety. Only between 9am and 6pm.   MULTIPLE VITAMIN (MULTIVITAMIN) TABLET    Take 1 tablet by mouth daily.    OMEPRAZOLE (PRILOSEC) 20 MG CAPSULE    Take 40 mg by mouth daily.    POLYETHYLENE GLYCOL (GLYCOLAX) PACKET    Take 17 g by mouth every other day. Mix into 8 ounces fluid for constipation   PROPYLENE GLYCOL (SYSTANE BALANCE) 0.6 % SOLN    Place 1 drop into both eyes 2 (two) times daily.    QUETIAPINE (SEROQUEL) 25 MG TABLET    Take 62.5-75 mg by mouth every morning. Take 75mg  every morning except 62.5 mg on Wednesday for psychosis.   TRAZODONE (DESYREL) 50 MG TABLET    Take 25-50 mg by mouth at bedtime as needed for sleep. Take 1/2 tablet (25 mg) by mouth if needed for sleep, then 50mg  1 hour later if ineffective.   Modified Medications   No medications on file   New Prescriptions   No medications on file   Discontinued Medications   ASPIRIN 81 MG CHEWABLE TABLET    Chew 4 tablets (324 mg total) by mouth daily.  No orders of the defined types were placed in this encounter.    ASSESSMENT & PLAN:  See problem based charting & AVS for pt instructions.

## 2013-12-29 DIAGNOSIS — J189 Pneumonia, unspecified organism: Secondary | ICD-10-CM

## 2013-12-29 HISTORY — DX: Pneumonia, unspecified organism: J18.9

## 2013-12-31 ENCOUNTER — Emergency Department (HOSPITAL_COMMUNITY): Payer: PRIVATE HEALTH INSURANCE

## 2013-12-31 ENCOUNTER — Encounter (HOSPITAL_COMMUNITY): Payer: Self-pay | Admitting: Emergency Medicine

## 2013-12-31 ENCOUNTER — Inpatient Hospital Stay (HOSPITAL_COMMUNITY)
Admission: EM | Admit: 2013-12-31 | Discharge: 2014-01-04 | DRG: 871 | Disposition: A | Discharge status: Skilled Nursing Facility | Payer: PRIVATE HEALTH INSURANCE | Attending: Family Medicine | Admitting: Family Medicine

## 2013-12-31 DIAGNOSIS — J4489 Other specified chronic obstructive pulmonary disease: Secondary | ICD-10-CM | POA: Diagnosis present

## 2013-12-31 DIAGNOSIS — G4700 Insomnia, unspecified: Secondary | ICD-10-CM | POA: Diagnosis present

## 2013-12-31 DIAGNOSIS — E876 Hypokalemia: Secondary | ICD-10-CM | POA: Diagnosis not present

## 2013-12-31 DIAGNOSIS — F323 Major depressive disorder, single episode, severe with psychotic features: Secondary | ICD-10-CM

## 2013-12-31 DIAGNOSIS — M109 Gout, unspecified: Secondary | ICD-10-CM | POA: Diagnosis present

## 2013-12-31 DIAGNOSIS — F329 Major depressive disorder, single episode, unspecified: Secondary | ICD-10-CM | POA: Diagnosis present

## 2013-12-31 DIAGNOSIS — Z593 Problems related to living in residential institution: Secondary | ICD-10-CM

## 2013-12-31 DIAGNOSIS — N183 Chronic kidney disease, stage 3 unspecified: Secondary | ICD-10-CM | POA: Diagnosis present

## 2013-12-31 DIAGNOSIS — E039 Hypothyroidism, unspecified: Secondary | ICD-10-CM | POA: Diagnosis present

## 2013-12-31 DIAGNOSIS — R6520 Severe sepsis without septic shock: Secondary | ICD-10-CM

## 2013-12-31 DIAGNOSIS — G2401 Drug induced subacute dyskinesia: Secondary | ICD-10-CM | POA: Diagnosis present

## 2013-12-31 DIAGNOSIS — K219 Gastro-esophageal reflux disease without esophagitis: Secondary | ICD-10-CM | POA: Diagnosis present

## 2013-12-31 DIAGNOSIS — I059 Rheumatic mitral valve disease, unspecified: Secondary | ICD-10-CM | POA: Diagnosis present

## 2013-12-31 DIAGNOSIS — E559 Vitamin D deficiency, unspecified: Secondary | ICD-10-CM | POA: Diagnosis present

## 2013-12-31 DIAGNOSIS — G2581 Restless legs syndrome: Secondary | ICD-10-CM | POA: Diagnosis present

## 2013-12-31 DIAGNOSIS — J449 Chronic obstructive pulmonary disease, unspecified: Secondary | ICD-10-CM

## 2013-12-31 DIAGNOSIS — G8250 Quadriplegia, unspecified: Secondary | ICD-10-CM | POA: Diagnosis present

## 2013-12-31 DIAGNOSIS — J9600 Acute respiratory failure, unspecified whether with hypoxia or hypercapnia: Secondary | ICD-10-CM

## 2013-12-31 DIAGNOSIS — I2789 Other specified pulmonary heart diseases: Secondary | ICD-10-CM | POA: Diagnosis present

## 2013-12-31 DIAGNOSIS — Z9981 Dependence on supplemental oxygen: Secondary | ICD-10-CM

## 2013-12-31 DIAGNOSIS — N179 Acute kidney failure, unspecified: Secondary | ICD-10-CM | POA: Diagnosis present

## 2013-12-31 DIAGNOSIS — J189 Pneumonia, unspecified organism: Secondary | ICD-10-CM

## 2013-12-31 DIAGNOSIS — E875 Hyperkalemia: Secondary | ICD-10-CM | POA: Diagnosis present

## 2013-12-31 DIAGNOSIS — I10 Essential (primary) hypertension: Secondary | ICD-10-CM

## 2013-12-31 DIAGNOSIS — J9690 Respiratory failure, unspecified, unspecified whether with hypoxia or hypercapnia: Secondary | ICD-10-CM

## 2013-12-31 DIAGNOSIS — E870 Hyperosmolality and hypernatremia: Secondary | ICD-10-CM | POA: Diagnosis present

## 2013-12-31 DIAGNOSIS — Z7982 Long term (current) use of aspirin: Secondary | ICD-10-CM

## 2013-12-31 DIAGNOSIS — F32A Depression, unspecified: Secondary | ICD-10-CM

## 2013-12-31 DIAGNOSIS — M1710 Unilateral primary osteoarthritis, unspecified knee: Secondary | ICD-10-CM | POA: Diagnosis present

## 2013-12-31 DIAGNOSIS — Z66 Do not resuscitate: Secondary | ICD-10-CM | POA: Diagnosis present

## 2013-12-31 DIAGNOSIS — F3289 Other specified depressive episodes: Secondary | ICD-10-CM | POA: Diagnosis present

## 2013-12-31 DIAGNOSIS — E785 Hyperlipidemia, unspecified: Secondary | ICD-10-CM

## 2013-12-31 DIAGNOSIS — A419 Sepsis, unspecified organism: Principal | ICD-10-CM

## 2013-12-31 DIAGNOSIS — J9620 Acute and chronic respiratory failure, unspecified whether with hypoxia or hypercapnia: Secondary | ICD-10-CM | POA: Diagnosis present

## 2013-12-31 DIAGNOSIS — F209 Schizophrenia, unspecified: Secondary | ICD-10-CM | POA: Diagnosis present

## 2013-12-31 DIAGNOSIS — M4716 Other spondylosis with myelopathy, lumbar region: Secondary | ICD-10-CM | POA: Diagnosis present

## 2013-12-31 DIAGNOSIS — Z6831 Body mass index (BMI) 31.0-31.9, adult: Secondary | ICD-10-CM

## 2013-12-31 DIAGNOSIS — Z789 Other specified health status: Secondary | ICD-10-CM

## 2013-12-31 DIAGNOSIS — M4712 Other spondylosis with myelopathy, cervical region: Secondary | ICD-10-CM | POA: Diagnosis present

## 2013-12-31 DIAGNOSIS — I4891 Unspecified atrial fibrillation: Secondary | ICD-10-CM

## 2013-12-31 DIAGNOSIS — H04129 Dry eye syndrome of unspecified lacrimal gland: Secondary | ICD-10-CM | POA: Diagnosis present

## 2013-12-31 DIAGNOSIS — Z79899 Other long term (current) drug therapy: Secondary | ICD-10-CM

## 2013-12-31 DIAGNOSIS — N3020 Other chronic cystitis without hematuria: Secondary | ICD-10-CM | POA: Diagnosis present

## 2013-12-31 DIAGNOSIS — G9349 Other encephalopathy: Secondary | ICD-10-CM | POA: Diagnosis present

## 2013-12-31 DIAGNOSIS — I129 Hypertensive chronic kidney disease with stage 1 through stage 4 chronic kidney disease, or unspecified chronic kidney disease: Secondary | ICD-10-CM | POA: Diagnosis present

## 2013-12-31 DIAGNOSIS — G894 Chronic pain syndrome: Secondary | ICD-10-CM

## 2013-12-31 DIAGNOSIS — H4010X0 Unspecified open-angle glaucoma, stage unspecified: Secondary | ICD-10-CM | POA: Diagnosis present

## 2013-12-31 DIAGNOSIS — E669 Obesity, unspecified: Secondary | ICD-10-CM | POA: Diagnosis present

## 2013-12-31 DIAGNOSIS — F0390 Unspecified dementia without behavioral disturbance: Secondary | ICD-10-CM | POA: Diagnosis present

## 2013-12-31 DIAGNOSIS — F411 Generalized anxiety disorder: Secondary | ICD-10-CM | POA: Diagnosis present

## 2013-12-31 DIAGNOSIS — M353 Polymyalgia rheumatica: Secondary | ICD-10-CM | POA: Diagnosis present

## 2013-12-31 HISTORY — DX: Respiratory failure, unspecified, unspecified whether with hypoxia or hypercapnia: J96.90

## 2013-12-31 LAB — URINALYSIS, ROUTINE W REFLEX MICROSCOPIC
Glucose, UA: NEGATIVE mg/dL
Ketones, ur: NEGATIVE mg/dL
NITRITE: NEGATIVE
PH: 5 (ref 5.0–8.0)
Protein, ur: 100 mg/dL — AB
SPECIFIC GRAVITY, URINE: 1.028 (ref 1.005–1.030)
Urobilinogen, UA: 1 mg/dL (ref 0.0–1.0)

## 2013-12-31 LAB — CBC
HCT: 41.2 % (ref 36.0–46.0)
HCT: 39.2 % (ref 36.0–46.0)
HEMOGLOBIN: 11.8 g/dL — AB (ref 12.0–15.0)
Hemoglobin: 12.5 g/dL (ref 12.0–15.0)
MCH: 29.2 pg (ref 26.0–34.0)
MCH: 29.1 pg (ref 26.0–34.0)
MCHC: 30.3 g/dL (ref 30.0–36.0)
MCHC: 30.1 g/dL (ref 30.0–36.0)
MCV: 96.3 fL (ref 78.0–100.0)
MCV: 96.6 fL (ref 78.0–100.0)
PLATELETS: 136 10*3/uL — AB (ref 150–400)
Platelets: 160 10*3/uL (ref 150–400)
RBC: 4.28 MIL/uL (ref 3.87–5.11)
RBC: 4.06 MIL/uL (ref 3.87–5.11)
RDW: 17.2 % — ABNORMAL HIGH (ref 11.5–15.5)
RDW: 17 % — ABNORMAL HIGH (ref 11.5–15.5)
WBC: 5.1 10*3/uL (ref 4.0–10.5)
WBC: 3.7 10*3/uL — AB (ref 4.0–10.5)

## 2013-12-31 LAB — I-STAT VENOUS BLOOD GAS, ED
Acid-Base Excess: 5 mmol/L — ABNORMAL HIGH (ref 0.0–2.0)
Bicarbonate: 34.3 mEq/L — ABNORMAL HIGH (ref 20.0–24.0)
O2 Saturation: 75 %
PH VEN: 7.274 (ref 7.250–7.300)
TCO2: 37 mmol/L (ref 0–100)
pCO2, Ven: 74.1 mmHg (ref 45.0–50.0)
pO2, Ven: 47 mmHg — ABNORMAL HIGH (ref 30.0–45.0)

## 2013-12-31 LAB — COMPREHENSIVE METABOLIC PANEL
ALT: 14 U/L (ref 0–35)
AST: 13 U/L (ref 0–37)
Albumin: 3 g/dL — ABNORMAL LOW (ref 3.5–5.2)
Alkaline Phosphatase: 66 U/L (ref 39–117)
BUN: 39 mg/dL — ABNORMAL HIGH (ref 6–23)
CALCIUM: 9.6 mg/dL (ref 8.4–10.5)
CO2: 34 mEq/L — ABNORMAL HIGH (ref 19–32)
CREATININE: 1.82 mg/dL — AB (ref 0.50–1.10)
Chloride: 105 mEq/L (ref 96–112)
GFR calc Af Amer: 29 mL/min — ABNORMAL LOW (ref >90)
GFR calc non Af Amer: 25 mL/min — ABNORMAL LOW (ref >90)
Glucose, Bld: 139 mg/dL — ABNORMAL HIGH (ref 70–99)
Potassium: 5.4 mEq/L — ABNORMAL HIGH (ref 3.7–5.3)
Sodium: 146 mEq/L (ref 137–147)
TOTAL PROTEIN: 6.9 g/dL (ref 6.0–8.3)
Total Bilirubin: 0.2 mg/dL — ABNORMAL LOW (ref 0.3–1.2)

## 2013-12-31 LAB — URINE MICROSCOPIC-ADD ON

## 2013-12-31 LAB — MRSA PCR SCREENING: MRSA by PCR: NEGATIVE

## 2013-12-31 LAB — LACTIC ACID, PLASMA: Lactic Acid, Venous: 0.6 mmol/L (ref 0.5–2.2)

## 2013-12-31 LAB — PRO B NATRIURETIC PEPTIDE: PRO B NATRI PEPTIDE: 7743 pg/mL — AB (ref 0–450)

## 2013-12-31 LAB — CREATININE, SERUM
CREATININE: 1.58 mg/dL — AB (ref 0.50–1.10)
GFR calc non Af Amer: 30 mL/min — ABNORMAL LOW (ref >90)
GFR, EST AFRICAN AMERICAN: 35 mL/min — AB (ref >90)

## 2013-12-31 LAB — TROPONIN I

## 2013-12-31 LAB — CBG MONITORING, ED: GLUCOSE-CAPILLARY: 128 mg/dL — AB (ref 70–99)

## 2013-12-31 MED ORDER — SODIUM CHLORIDE 0.9 % IJ SOLN
3.0000 mL | Freq: Two times a day (BID) | INTRAMUSCULAR | Status: DC
  Administered 2013-12-31: 21:00:00 via INTRAVENOUS
  Administered 2014-01-02 – 2014-01-04 (×4): 3 mL via INTRAVENOUS

## 2013-12-31 MED ORDER — VANCOMYCIN HCL IN DEXTROSE 1-5 GM/200ML-% IV SOLN
1000.0000 mg | Freq: Once | INTRAVENOUS | Status: AC
  Administered 2013-12-31: 1000 mg via INTRAVENOUS
  Filled 2013-12-31: qty 200

## 2013-12-31 MED ORDER — IPRATROPIUM-ALBUTEROL 0.5-2.5 (3) MG/3ML IN SOLN: 3.0000 mL | RESPIRATORY_TRACT | Status: DC | PRN

## 2013-12-31 MED ORDER — CEFEPIME HCL 2 G IJ SOLR
2.0000 g | INTRAMUSCULAR | Status: DC
  Administered 2013-12-31 – 2014-01-02 (×3): 2 g via INTRAVENOUS
  Filled 2013-12-31 (×4): qty 2

## 2013-12-31 MED ORDER — FUROSEMIDE 10 MG/ML IJ SOLN
80.0000 mg | Freq: Once | INTRAMUSCULAR | Status: AC
  Administered 2013-12-31: 80 mg via INTRAVENOUS
  Filled 2013-12-31: qty 8

## 2013-12-31 MED ORDER — VANCOMYCIN HCL IN DEXTROSE 750-5 MG/150ML-% IV SOLN
750.0000 mg | Freq: Once | INTRAVENOUS | Status: AC
  Administered 2013-12-31: 750 mg via INTRAVENOUS
  Filled 2013-12-31: qty 150

## 2013-12-31 MED ORDER — ACETAMINOPHEN 650 MG RE SUPP: 650.0000 mg | Freq: Four times a day (QID) | RECTAL | Status: DC | PRN

## 2013-12-31 MED ORDER — VANCOMYCIN HCL 10 G IV SOLR
1250.0000 mg | INTRAVENOUS | Status: DC
  Administered 2014-01-01 – 2014-01-02 (×2): 1250 mg via INTRAVENOUS
  Filled 2013-12-31 (×3): qty 1250

## 2013-12-31 MED ORDER — PIPERACILLIN-TAZOBACTAM 3.375 G IVPB 30 MIN
3.3750 g | Freq: Once | INTRAVENOUS | Status: AC
  Administered 2013-12-31: 3.375 g via INTRAVENOUS
  Filled 2013-12-31: qty 50

## 2013-12-31 MED ORDER — IPRATROPIUM-ALBUTEROL 0.5-2.5 (3) MG/3ML IN SOLN
3.0000 mL | RESPIRATORY_TRACT | Status: DC
  Administered 2013-12-31 (×2): 3 mL via RESPIRATORY_TRACT
  Filled 2013-12-31 (×3): qty 3

## 2013-12-31 MED ORDER — ACETAMINOPHEN 325 MG PO TABS: 650.0000 mg | ORAL_TABLET | Freq: Four times a day (QID) | ORAL | Status: DC | PRN

## 2013-12-31 MED ORDER — HEPARIN SODIUM (PORCINE) 5000 UNIT/ML IJ SOLN
5000.0000 [IU] | Freq: Three times a day (TID) | INTRAMUSCULAR | Status: DC
  Administered 2013-12-31 – 2014-01-04 (×12): 5000 [IU] via SUBCUTANEOUS
  Filled 2013-12-31 (×15): qty 1

## 2013-12-31 MED ORDER — SODIUM CHLORIDE 0.9 % IV BOLUS (SEPSIS)
1000.0000 mL | Freq: Once | INTRAVENOUS | Status: AC
  Administered 2013-12-31: 1000 mL via INTRAVENOUS

## 2013-12-31 NOTE — ED Notes (Signed)
Family practice MD at bedside.

## 2013-12-31 NOTE — ED Notes (Signed)
This RN spoke with Efraim KaufmannMelissa, RN at [facility], to get more details of the pt's decline. Melissa reported the pt hadn't been eating or drinking all day, and been "lethargic" all day. MD was called and the pt was started on D5W at 75 ml/hour. Pt was diagnosed with left lower lobe pneumonia yesterday. Pt received Rocephin at 1900. Evening vital signs were taken at 2300, when the pt was noted to be satting 71% on 2L. Pt's oxygen was increased to 4L, and her sats increased to 81%. Pt reportedly continued to be "lethargic". At 0100, pt received an albuterol treatment and 125 mg of Solumedrol. Melissa, RN stated that the pt did not get up for dinner. Upon arrival to the ER, pt unresponsive, and her breathing being assisted with AMBU bag.

## 2013-12-31 NOTE — ED Notes (Signed)
Pt is from [facility]. Pt is a DNR. EMS was told that the pt has been lethargic since 2130 this evening. Pt wears 2L of O2 normally, and per nursing home staff was satting 70%. They increased the dose to 4 L and her sats increased to 78%. Pt is unresponsive to voice and pain. Per EMS, pt was unresponsive upon their arrival to the facility. Upon arrival to the ER, pt was breathing 3 breaths a minute, and was breathing via ambu bag. EMS reports that the pt was recently diagnosed with pneumonia.

## 2013-12-31 NOTE — H&P (Signed)
Attending Addendum  I examined the patient and discussed the assessment and plan with Dr. Aviva SignsPiloto. I have reviewed the note and agree.  Briefly, 80 yo F [facility] patient found unresponsive and in respiratory distress last evening. She is on BIPAP and unable to give a history.   BP 125/66  Pulse 105  Temp(Src) 99.9 F (37.7 C) (Core (Comment))  Resp 25  SpO2 98% General appearance: unresponsive. Moans and draws away from pain. Does not open eyes. GCS 7.  Lungs: BIPAP in place. Loud upper airways sounds. No crackles appreciated. Heart: regular, tachy.  Extremities: extremities normal, atraumatic, no cyanosis or edema  Reviewed labs and studies: No CBC available (ordered) Last ECHO 2008: normal EF. Elevated LVFP.   A/P:  80 yo F NH patient with dementia, chronic pain on narcotics, depression an COPD on chronic O2 via [place] admitted with unresponsive and acute on chronic respiratory failure:  1. Acute on chronic resp failure: HCAP vs COPD exacerbation vs CHF exacerbation. Patient is s/p lasix 40 I vx one and IV Vanc and CTX.  P: Admit to stepdown IV vanc and cefepine Hold lasix for now and fluid resuscitate with 1 L NS bolus give tachycardia, hypotension, presumed sepsis. CBC ECHO F/u response to 1 L NS and give additional vs give lasix prn resp status and lung exam. Blood Cx and Urine Cx pending.  Dispo back to SNF pending clinical improvement.      Dessa PhiFUNCHES,Jassica Zazueta, MD FAMILY MEDICINE TEACHING SERVICE

## 2013-12-31 NOTE — ED Notes (Signed)
While this RN was in the room, pt spontaneously opened her eyes. When addressed, pt shut her eyes and would not respond to voice.

## 2013-12-31 NOTE — H&P (Signed)
Family Medicine Teaching [facility] Admission History and Physical Service Pager: [phone]  Patient name: Tammy Salinas Medical record number: [id number] Date of birth: 11-05-1933 Age: 80 y.o. Gender: female  Primary Care Provider: Gaspar Bidding, DO Consultants: none Code Status: DNR  Chief Complaint: respiratory failure  Assessment and Plan: Tammy Salinas is a 80 y.o. female presenting with respiratory failure secondary to HCAP. PMH is significant for  obesity, HTN, HLP, COPD, Hypothyroidism, Senile Dementia, Tardive Dyskinesia  #1 Respiratory failure secondary to HCAP, sepsis and volume overload  pt was diagnosed yesterday with PNA at nursing home. 1 dose of Rocephin was given at 7:00PM yesterday and had a fever shortly after that (no precise temp). CXR done here corroborated this diagnosis. At ED pt has 4 breath per min upon arrival and was placed in BIPAP. Pt is DNI/DNR - admit to step down unit - telemetry monitoring.  - continue BIPAP - continue Vancomycin an d Cefepime per pharmacy - will obtain CBC and trend WBC - blood cultures drawn 2/23 - hx of COPD, no significant wheezing on exam. Will treat with PRN duoneb - received a dose of solumedrol at Nursing home.  - fluids only if hemodynamic instability.   #2. Concern for volume overload: pt with not known hx of CHF. Last ECHO was in 2008 with  Normal EF and no diastolic dysfunction. ProBNP has been in the ~79 range 5 months ago. Today BNP is 7743 with 1 troponin negative. EKG no signs of ischemia. Afib rate controlled. - Furosemide given in ED 80 mg X 1. Will hold furosemide due to low BP's and will restart diuresis when more hemodynamically stable. - ECHO - cycle troponin  #3. HTN: BP bordeline low.  - Will hold BP medication at this time. - Telemetry  #4. Afib: normal rate. No on rate Rate control medication. No on anticoagulation - will continue to monitor.   #5. Hx of anemia. No signs of acute bleeding -  will obtain CBC   # CKD: GFR on 07/2013 was 34. It was reported to be acute that resolved. Today is 29. Cr is 1.82, baseline seemed to be 1.2. Diuresis may help with possible cardio-renal insult. K is 5.4 without EKG changes for hyperkalemia. - Will continue to monitor K and Cr. Albuterol tx may help with mild hyperkalemia, will consider Nephrology consult if no improvement with current treatment.   #5. Hypothyroidism On levothyroxine : will hold when pt is not able to tolerate PO  #6 Dementia and tardive dyskinesia On Aricept, Cymbalta, Seroquel, and Ativan PRN. Will hold until pt is awake and this meds can be given PO  FEN/GI: NPO/ 1 bolus NS, then KVO Prophylaxis: Heparin   Disposition: Pending improvement  History of Present Illness: Tammy Salinas is a 80 y.o. female presenting to the ED from Nursing Home with newly diagnosed pneumonia. Per nursing home report she got one dose of Rocephin at 7:00 pm yesterday and shortly after that she had a fever. She also got one breathing treatment and solumedrol since family wanted to keep her out of the hospital as much as possible. We were called with concerns of a new 4 liter Oxygen requirement and the fact that Ms. Bergevin was declining on her level of alertness. Pt was brought to ED when was found to have very low RR (4per min) and was immediately placed in BIPAP. Pt is DNR/DNI.   Review Of Systems: Per HPI Otherwise 12 point review of systems was performed  and was unremarkable.  Patient Active Problem List   Diagnosis Date Noted  . Atrial fibrillation 12/05/2013  . Excessive cerumen in both ear canals 12/05/2013  . Mitral stenosis 10/22/2013  . Weight gain 09/27/2013  . Oxygen desaturation 09/27/2013  . Postural tremor 07/31/2012  . Dry eye syndrome 01/25/2012  . COPD (chronic obstructive pulmonary disease) 12/29/2011  . Insomnia 02/17/2011  . UNSPECIFIED OPEN-ANGLE GLAUCOMA 08/02/2010  . Chronic pain syndrome 02/18/2010  .  HYPERLIPIDEMIA 03/12/2009  . Depressive type psychosis 09/12/2008  . TARDIVE DYSKINESIA 08/29/2008  . VITAMIN D DEFICIENCY 08/21/2008  . DEMENTIA, SENILE 07/08/2008  . Person living in residential institution 12/08/2007  . HYPOTHYROIDISM 05/26/2007  . OBESITY 05/26/2007  . ANEMIA NOS 05/26/2007  . HYPERTENSION, BENIGN ESSENTIAL 05/26/2007  . INCONTINENCE, URGE 05/26/2007   Past Medical History: Past Medical History  Diagnosis Date  . Schizophrenia     Rx Seroquel, Aricept, Cymbalta. MMSE 22/30.  Marland Kitchen. Dementia   . Hyperlipidemia   . Depression   . GERD (gastroesophageal reflux disease)   . Chronic kidney disease (CKD)     Creatinine 1.15 (01/21/11).  . Hypothyroid   . Chronic pain     Dx Gout, Back Pain, Hx PMR - Continues to c/o pain chronically. On Tylenol, Gabapentin, and Oxycontin.  Weaned from prednisone with no change in function. Now in OT 3x/week. NOTE: PATIENT HAS CHRONIC RIGHT HAND PAIN.  Marland Kitchen. Hemorrhoids     Rx Miralax, Tucks, Hydrocortisone.   . Cystitis, chronic     Pt with Hx multiple UTIs. Followed by [facility]. Do not get UA unless systemic symptoms.  . Femur fracture, left     Pt with fall 4/409 with L femur frx; s/p ORIF 02/14/08 by Dr Shon BatonBrooks.  No longer on coumadin. Repeat xray of leg per ortho showed separation at repair site. Pt no longer ambulatory. Rx ASA, Calcium, Vitamin D, Bisphosphonate.  . Hypothyroid     Rx Synthroid.  Marland Kitchen. Hypertension     Rx Norvasc.  Marland Kitchen. Dysphagia   . Fracture, humerus, proximal 08/2009    Non-union  . RESTLESS LEG SYNDROME 05/26/2007  . HIP FRACTURE, LEFT 02/29/2008    Qualifier: Diagnosis of  By: Corliss MarcusEED CRAWFORD MD, MAKEECHA    . PRESSURE ULCER OTHER SITE 06/10/2010    Qualifier: Diagnosis of  By: Sheffield SliderHale MD, Deniece PortelaWayne    . GOUT 05/26/2007  . Hyperkalemia 07/19/2013  . Pain in joint, ankle and foot 02/07/2013  . Polymyalgia rheumatica 05/26/2007  . SPONDYLOSIS, LUMBAR W/MYELOPATHY 05/26/2007  . SPONDYLOSIS, CERVICAL W/MYELOPATHY 05/26/2007     Quadriparetic due to this and lumbar stenosis   . WRIST PAIN, RIGHT 10/08/2009  . DEGENERATIVE JOINT DISEASE, BOTH KNEES, SEVERE 05/26/2007  . HEMORRHOIDS 05/05/2010    Qualifier: Diagnosis of  By: Quincy CarnesWallace DO, Erica    . URINARY TRACT INFECTION, RECURRENT 05/31/2008   Past Surgical History: Past Surgical History  Procedure Laterality Date  . Total abdominal hysterectomy      left  . Orif hip fracture  2009    leftf  . Humeral arthroplasty  08/15/09    left  . Posterior laminectomy / decompression lumbar spine      L 1-2  . Umbilical hernia repair     Social History: History  Substance Use Topics  . Smoking status: Never Smoker   . Smokeless tobacco: Never Used  . Alcohol Use: No   Please also refer to relevant sections of EMR.  Family History: History reviewed. No pertinent family  history. Allergies and Medications: Allergies  Allergen Reactions  . Hydrochlorothiazide     REACTION: elevated calcium   No current facility-administered medications on file prior to encounter.   Current Outpatient Prescriptions on File Prior to Encounter  Medication Sig Dispense Refill  . acetaminophen (TYLENOL) 500 MG tablet Take 2 tablets (1,000 mg total) by mouth 3 (three) times daily.      Marland Kitchen albuterol (PROVENTIL) (2.5 MG/3ML) 0.083% nebulizer solution Take 2.5 mg by nebulization every 4 (four) hours as needed for shortness of breath.      . allopurinol (ZYLOPRIM) 300 MG tablet Take 1 tablet (300 mg total) by mouth daily.      Marland Kitchen amLODipine (NORVASC) 10 MG tablet Take 1 tablet (10 mg total) by mouth daily.      Marland Kitchen arformoterol (BROVANA) 15 MCG/2ML NEBU Take 2 mLs (15 mcg total) by nebulization 2 (two) times daily.      Marland Kitchen atorvastatin (LIPITOR) 10 MG tablet Take 1 tablet (10 mg total) by mouth at bedtime.      . Cholecalciferol 50000 UNITS capsule Take 50,000 Units by mouth every 30 (thirty) days. Given on 14th of month.      . colchicine 0.6 MG tablet Take 1 tablet (0.6 mg total) by mouth  daily.      . cycloSPORINE (RESTASIS) 0.05 % ophthalmic emulsion Place 1 drop into both eyes 2 (two) times daily.       Marland Kitchen donepezil (ARICEPT) 10 MG tablet Take 1 tablet (10 mg total) by mouth at bedtime.      . DULoxetine (CYMBALTA) 30 MG capsule Take 1 capsule (30 mg total) by mouth daily.      Marland Kitchen gabapentin (NEURONTIN) 300 MG tablet Take 900 mg by mouth 3 (three) times daily.       . GuaiFENesin (MUCINEX FOR KIDS PO) Take 500 mg by mouth 2 (two) times daily. Kids Mini-melts- Give 1 pack BID.      Marland Kitchen guaiFENesin (ROBITUSSIN) 100 MG/5ML liquid Take 400 mg by mouth every 6 (six) hours as needed for cough.      . levothyroxine (SYNTHROID, LEVOTHROID) 50 MCG tablet Take 1 tablet (50 mcg total) by mouth daily.      Marland Kitchen loratadine (CLARITIN) 10 MG tablet Take 10 mg by mouth daily as needed.       Marland Kitchen LORazepam (ATIVAN) 0.5 MG tablet Take 0.25 mg by mouth daily as needed for anxiety. Only between 9am and 6pm.      . Multiple Vitamin (MULTIVITAMIN) tablet Take 1 tablet by mouth daily.       Marland Kitchen omeprazole (PRILOSEC) 20 MG capsule Take 40 mg by mouth daily.       . polyethylene glycol (GLYCOLAX) packet Take 17 g by mouth every other day. Mix into 8 ounces fluid for constipation      . Propylene Glycol (SYSTANE BALANCE) 0.6 % SOLN Place 1 drop into both eyes 2 (two) times daily.       . traZODone (DESYREL) 50 MG tablet Take 25-50 mg by mouth at bedtime as needed for sleep. Take 1/2 tablet (25 mg) by mouth if needed for sleep, then 50mg  1 hour later if ineffective.      . budesonide (PULMICORT) 0.25 MG/2ML nebulizer solution Take 0.25 mg by nebulization 2 (two) times daily. prior to brovana      . QUEtiapine (SEROQUEL) 25 MG tablet Take 62.5-75 mg by mouth every morning. Take 75mg  every morning except 62.5 mg on Wednesday for psychosis.  Objective: BP 108/60  Pulse 55  Resp 24  SpO2 91% Exam: Gen:  Obese and only responsive to painful stimuli with eye movement.  HEENT: Moist mucous membranes. Neck  supple. No JVD apreciated. CV: irregularly irregular, no murmurs rubs or gallops PULM: diminished breath sound bilaterally and bibasilar rales present ABD: Soft, +BS EXT: downward feet positioning. Trace edema in ankles.  Labs and Imaging: CBC BMET  No results found for this basename: WBC, HGB, HCT, PLT,  in the last 168 hours  Recent Labs Lab 12/31/13 0253  NA 146  K 5.4*  CL 105  CO2 34*  BUN 39*  CREATININE 1.82*  GLUCOSE 139*  CALCIUM 9.6    Lactic acid 0.6 Troponin I 0.30 Pro BNP 7743.0  CXR 1. Increasing interstitial prominence, with the right upper lobe and retrocardiac airspace opacities, this could reflect bronchopneumonia.  2. Stable cardiomegaly and fullness of the pulmonary hila which may be seen with pulmonary arterial hypertension.  3. Small left pleural effusion with fluid tracking along the right major fissure versus discoid atelectasis.   Dayarmys Piloto de Criselda Peaches, MD 12/31/2013, 5:12 AM PGY-3, [facility] Family Medicine FPTS Intern pager: [phone], text pages welcome

## 2013-12-31 NOTE — Progress Notes (Signed)
Pt transported on bipap to 2h19 remains on previous setting ipap 18, epap 6, fio2 40% saturations 95%. Pt tolerating well.

## 2013-12-31 NOTE — Care Management Note (Signed)
    Page 1 of 1   12/31/2013     11:20:04 AM   CARE MANAGEMENT NOTE 12/31/2013  Patient:  Tammy Salinas,Tammy Salinas   Account Number:  [id number]  Date Initiated:  12/31/2013  Documentation initiated by:  Junius CreamerWELL,DEBBIE  Subjective/Objective Assessment:   adm w resp failure     Action/Plan:   lives w da, pcp dr m rigby   Anticipated DC Date:     Anticipated DC Plan:           Choice offered to / List presented to:             Status of service:   Medicare Important Message given?   (If response is "NO", the following Medicare IM given date fields will be blank) Date Medicare IM given:   Date Additional Medicare IM given:    Discharge Disposition:    Per UR Regulation:  Reviewed for med. necessity/level of care/duration of stay  If discussed at Long Length of Stay Meetings, dates discussed:    Comments:

## 2013-12-31 NOTE — Assessment & Plan Note (Addendum)
Visit 2/19 at [facility] This is the most alert I have seen the patient (although still minimally verbal).

## 2013-12-31 NOTE — ED Notes (Signed)
Called pt's son Lyman Bishop([place])  to notify that pt will be going to [facility]H19.

## 2013-12-31 NOTE — Progress Notes (Signed)
Family Medicine Teaching Service PCP note Service Pager: [phone]  Sherlene ShamsLeona C Cappucci 80 y.o. female  MRN: [id number]  DOB: Jul 10, 1933   Micah FlesherWent by and discussed clinical situation with patient, her son and her daughter.  Patient was on BiPAP and was minimally conversant.  Both her son and her daughter are frustrated that this decompensation seem to correlate with a change in her Seroquel per her primary psychiatrist.  Although this is a possibility it seems as though the development of a pneumonia in this short period is less likely.  I have recently seen the patient and she was doing extraordinarily well.  I am concerned she may having silent aspiration in her improved state and request speech therapy to evaluate the patient once she is improved from a respiratory standpoint.  Also agree with returning to her prior dose of Seroquel once taking by mouth..  I explained to the family the patient is currently critically ill and the priority right now is to work on her overall improvement.  I am hopeful the patient will continue to improve he is able to return to [facility] I will be happy to see her when she returns.  Should be noted that any medication changes as an outpatient need to be discussed first with her son who is her medical power of attorney.  They currently wish for her to live out the remainder of her life on as few medications as possible.    Andrena MewsMichael D Rigby, DO [facility] Res[facility] - PGY- 3  12/31/2013 6:07 PM

## 2013-12-31 NOTE — Assessment & Plan Note (Signed)
Chronic, previously stable condition no changes at this time. > Follow vital signs

## 2013-12-31 NOTE — ED Provider Notes (Signed)
CSN: [id number]     Arrival date & time 12/31/13  0229 History   First MD Initiated Contact with Patient 12/31/13 0230     Chief Complaint  Patient presents with  . Respiratory Distress  . Altered Mental Status     (Consider location/radiation/quality/duration/timing/severity/associated sxs/prior Treatment) HPI  Please note that this is a late entry. This patient is an elderly SNF resident with advanced dementia along with several other co-morbidities. She was diagnosed with pneumonia by CXR which was performed by a mobile unit yesterday and given a dose of Ceftriaxone IM.   Today, she is BIB by EMS from SNF with respiratory failure and AMS. Paramedics report that the patient was unresponsive when they arrived. They were told that the patient's oxygen sats were in the 70s. The patient was placed on a BVM for supplemental oxygen. It is noted that she has a DNR order with her paperwork from the facility.   I am unable to obtain hx from the patient due to AMS. Her son arrived shortly after she did and said that she is typically verbal but, confused and that she seemed to be doing very well just 2 days ago when he last visited her. He was told that her dose of Seroquel was doubled yesterday.   Past Medical History  Diagnosis Date  . Schizophrenia     Rx Seroquel, Aricept, Cymbalta. MMSE 22/30.  Marland Kitchen Dementia   . Hyperlipidemia   . Depression   . GERD (gastroesophageal reflux disease)   . Chronic kidney disease (CKD)     Creatinine 1.15 (01/21/11).  . Hypothyroid   . Chronic pain     Dx Gout, Back Pain, Hx PMR - Continues to c/o pain chronically. On Tylenol, Gabapentin, and Oxycontin.  Weaned from prednisone with no change in function. Now in OT 3x/week. NOTE: PATIENT HAS CHRONIC RIGHT HAND PAIN.  Marland Kitchen Hemorrhoids     Rx Miralax, Tucks, Hydrocortisone.   . Cystitis, chronic     Pt with Hx multiple UTIs. Followed by [facility]. Do not get UA unless systemic symptoms.  . Femur fracture,  left     Pt with fall 4/409 with L femur frx; s/p ORIF 02/14/08 by Dr Shon Baton.  No longer on coumadin. Repeat xray of leg per ortho showed separation at repair site. Pt no longer ambulatory. Rx ASA, Calcium, Vitamin D, Bisphosphonate.  . Hypothyroid     Rx Synthroid.  Marland Kitchen Hypertension     Rx Norvasc.  Marland Kitchen Dysphagia   . Fracture, humerus, proximal 08/2009    Non-union  . RESTLESS LEG SYNDROME 05/26/2007  . HIP FRACTURE, LEFT 02/29/2008    Qualifier: Diagnosis of  By: Corliss Marcus MD, MAKEECHA    . PRESSURE ULCER OTHER SITE 06/10/2010    Qualifier: Diagnosis of  By: Sheffield Slider MD, Deniece Portela    . GOUT 05/26/2007  . Hyperkalemia 07/19/2013  . Pain in joint, ankle and foot 02/07/2013  . Polymyalgia rheumatica 05/26/2007  . SPONDYLOSIS, LUMBAR W/MYELOPATHY 05/26/2007  . SPONDYLOSIS, CERVICAL W/MYELOPATHY 05/26/2007    Quadriparetic due to this and lumbar stenosis   . WRIST PAIN, RIGHT 10/08/2009  . DEGENERATIVE JOINT DISEASE, BOTH KNEES, SEVERE 05/26/2007  . HEMORRHOIDS 05/05/2010    Qualifier: Diagnosis of  By: Quincy Carnes    . URINARY TRACT INFECTION, RECURRENT 05/31/2008   Past Surgical History  Procedure Laterality Date  . Total abdominal hysterectomy      left  . Orif hip fracture  2009    leftf  .  Humeral arthroplasty  08/15/09    left  . Posterior laminectomy / decompression lumbar spine      L 1-2  . Umbilical hernia repair     History reviewed. No pertinent family history. History  Substance Use Topics  . Smoking status: Never Smoker   . Smokeless tobacco: Never Used  . Alcohol Use: No   OB History   Grav Para Term Preterm Abortions TAB SAB Ect Mult Living                 Review of Systems  UNABLE TO OBTAIN FROM THE PATIENT DUE TO AMS - NOTE LEVEL V CAVEAT APPLIES.     Allergies  Hydrochlorothiazide  Home Medications   Current Outpatient Rx  Name  Route  Sig  Dispense  Refill  . acetaminophen (TYLENOL) 500 MG tablet   Oral   Take 2 tablets (1,000 mg total) by mouth 3  (three) times daily.         Marland Kitchen albuterol (PROVENTIL) (2.5 MG/3ML) 0.083% nebulizer solution   Nebulization   Take 2.5 mg by nebulization every 4 (four) hours as needed for shortness of breath.         . allopurinol (ZYLOPRIM) 300 MG tablet   Oral   Take 1 tablet (300 mg total) by mouth daily.         Marland Kitchen amLODipine (NORVASC) 10 MG tablet   Oral   Take 1 tablet (10 mg total) by mouth daily.         Marland Kitchen arformoterol (BROVANA) 15 MCG/2ML NEBU   Nebulization   Take 2 mLs (15 mcg total) by nebulization 2 (two) times daily.         Marland Kitchen atorvastatin (LIPITOR) 10 MG tablet   Oral   Take 1 tablet (10 mg total) by mouth at bedtime.         . budesonide (PULMICORT) 0.25 MG/2ML nebulizer solution   Nebulization   Take 0.25 mg by nebulization 2 (two) times daily. prior to brovana         . Cholecalciferol 50000 UNITS capsule   Oral   Take 50,000 Units by mouth every 30 (thirty) days. Given on 14th of month.         . colchicine 0.6 MG tablet   Oral   Take 1 tablet (0.6 mg total) by mouth daily.         . cycloSPORINE (RESTASIS) 0.05 % ophthalmic emulsion   Both Eyes   Place 1 drop into both eyes 2 (two) times daily.          Marland Kitchen donepezil (ARICEPT) 10 MG tablet   Oral   Take 1 tablet (10 mg total) by mouth at bedtime.         . DULoxetine (CYMBALTA) 30 MG capsule   Oral   Take 1 capsule (30 mg total) by mouth daily.         Marland Kitchen gabapentin (NEURONTIN) 300 MG tablet   Oral   Take 900 mg by mouth 3 (three) times daily.          . GuaiFENesin (MUCINEX FOR KIDS PO)   Oral   Take 500 mg by mouth 2 (two) times daily. Kids Mini-melts- Give 1 pack BID.         Marland Kitchen guaiFENesin (ROBITUSSIN) 100 MG/5ML liquid   Oral   Take 400 mg by mouth every 6 (six) hours as needed for cough.         . levothyroxine (SYNTHROID,  LEVOTHROID) 50 MCG tablet   Oral   Take 1 tablet (50 mcg total) by mouth daily.         Marland Kitchen loratadine (CLARITIN) 10 MG tablet   Oral   Take 10 mg  by mouth daily as needed.          Marland Kitchen LORazepam (ATIVAN) 0.5 MG tablet   Oral   Take 0.25 mg by mouth daily as needed for anxiety. Only between 9am and 6pm.         . Multiple Vitamin (MULTIVITAMIN) tablet   Oral   Take 1 tablet by mouth daily.          Marland Kitchen omeprazole (PRILOSEC) 20 MG capsule   Oral   Take 20 mg by mouth daily.          . polyethylene glycol (GLYCOLAX) packet   Oral   Take 17 g by mouth every other day. Mix into 8 ounces fluid for constipation         . Propylene Glycol (SYSTANE BALANCE) 0.6 % SOLN   Both Eyes   Place 1 drop into both eyes 2 (two) times daily.          . QUEtiapine (SEROQUEL) 25 MG tablet   Oral   Take 62.5-75 mg by mouth every morning. Take 75mg  every morning except 62.5 mg on Wednesday for psychosis.         Marland Kitchen traZODone (DESYREL) 50 MG tablet   Oral   Take 25-50 mg by mouth at bedtime as needed for sleep. Take 1/2 tablet (25 mg) by mouth if needed for sleep, then 50mg  1 hour later if ineffective.          BP 107/67  Pulse 55  Resp 19  SpO2 91% Physical Exam Gen: well developed and well nourished appearing, obtunded Head: NCAT Eyes: PERL, EOMI pupils 2mm and reactive  Nose: no epistaixis or rhinorrhea Mouth/throat: mucosa is moist and pink Neck: supple, no stridor Lungs: RR approx 4/min with very poor chest rise and shallow inspirations, BS markedly diminished in all fields.  CV: rapid and regular, no murmur, extremities appear well perfused.  Abd: soft, obese, , nondistended Back: normal to inspection Skin: warm and dry Ext: normal to inspection, nonpitting bilateral pretibial edema Neuro: GCS: eye opening - none (1), Verbal - none (1), motor - extension to pain (2) = 4 Psyche; unable to assess.   ED Course  Procedures (including critical care time) Labs Review  Results for orders placed during the hospital encounter of 12/31/13 (from the past 24 hour(s))  CBG MONITORING, ED     Status: Abnormal   Collection  Time    12/31/13  2:42 AM      Result Value Ref Range   Glucose-Capillary 128 (*) 70 - 99 mg/dL  COMPREHENSIVE METABOLIC PANEL     Status: Abnormal   Collection Time    12/31/13  2:53 AM      Result Value Ref Range   Sodium 146  137 - 147 mEq/L   Potassium 5.4 (*) 3.7 - 5.3 mEq/L   Chloride 105  96 - 112 mEq/L   CO2 34 (*) 19 - 32 mEq/L   Glucose, Bld 139 (*) 70 - 99 mg/dL   BUN 39 (*) 6 - 23 mg/dL   Creatinine, Ser 1.47 (*) 0.50 - 1.10 mg/dL   Calcium 9.6  8.4 - 82.9 mg/dL   Total Protein 6.9  6.0 - 8.3 g/dL   Albumin 3.0 (*) 3.5 -  5.2 g/dL   AST 13  0 - 37 U/L   ALT 14  0 - 35 U/L   Alkaline Phosphatase 66  39 - 117 U/L   Total Bilirubin 0.2 (*) 0.3 - 1.2 mg/dL   GFR calc non Af Amer 25 (*) >90 mL/min   GFR calc Af Amer 29 (*) >90 mL/min  PRO B NATRIURETIC PEPTIDE     Status: Abnormal   Collection Time    12/31/13  2:53 AM      Result Value Ref Range   Pro B Natriuretic peptide (BNP) 7743.0 (*) 0 - 450 pg/mL  TROPONIN I     Status: None   Collection Time    12/31/13  2:54 AM      Result Value Ref Range   Troponin I <0.30  <0.30 ng/mL  LACTIC ACID, PLASMA     Status: None   Collection Time    12/31/13  2:55 AM      Result Value Ref Range   Lactic Acid, Venous 0.6  0.5 - 2.2 mmol/L  URINALYSIS, ROUTINE W REFLEX MICROSCOPIC     Status: Abnormal   Collection Time    12/31/13  3:22 AM      Result Value Ref Range   Color, Urine AMBER (*) YELLOW   APPearance CLOUDY (*) CLEAR   Specific Gravity, Urine 1.028  1.005 - 1.030   pH 5.0  5.0 - 8.0   Glucose, UA NEGATIVE  NEGATIVE mg/dL   Hgb urine dipstick MODERATE (*) NEGATIVE   Bilirubin Urine SMALL (*) NEGATIVE   Ketones, ur NEGATIVE  NEGATIVE mg/dL   Protein, ur 409100 (*) NEGATIVE mg/dL   Urobilinogen, UA 1.0  0.0 - 1.0 mg/dL   Nitrite NEGATIVE  NEGATIVE   Leukocytes, UA TRACE (*) NEGATIVE  URINE MICROSCOPIC-ADD ON     Status: Abnormal   Collection Time    12/31/13  3:22 AM      Result Value Ref Range   Squamous  Epithelial / LPF RARE  RARE   WBC, UA 0-2  <3 WBC/hpf   RBC / HPF 3-6  <3 RBC/hpf   Bacteria, UA MANY (*) RARE   Urine-Other AMORPHOUS URATES/PHOSPHATES    I-STAT VENOUS BLOOD GAS, ED     Status: Abnormal   Collection Time    12/31/13  4:22 AM      Result Value Ref Range   pH, Ven 7.274  7.250 - 7.300   pCO2, Ven 74.1 (*) 45.0 - 50.0 mmHg   pO2, Ven 47.0 (*) 30.0 - 45.0 mmHg   Bicarbonate 34.3 (*) 20.0 - 24.0 mEq/L   TCO2 37  0 - 100 mmol/L   O2 Saturation 75.0     Acid-Base Excess 5.0 (*) 0.0 - 2.0 mmol/L   Sample type VENOUS     Comment NOTIFIED PHYSICIAN        Imaging Review Dg Chest Port 1 View  12/31/2013   CLINICAL DATA:  Lethargy, hypoxemic.  EXAM: PORTABLE CHEST - 1 VIEW  COMPARISON:  DG CHEST 2 VIEW dated 07/18/2013  FINDINGS: Cardiac silhouette appears at least mildly enlarged even with consideration to this low inspiratory portable examination with crowded vasculature markings. Moderately calcified aortic knob. Similar fullness of pulmonary hila. Increasing interstitial prominence with patchy airspace opacity in the right upper lobe. Pleural thickening along the right fissure versus subsegmental atelectasis, mildly elevated right hemidiaphragm. Increasing retrocardiac airspace opacity. Suspected small left pleural effusion. No pneumothorax.  Partially imaged ACDF. Multiple EKG lines  overlie the patient and may obscure subtle underlying pathology. Osseous structures are nonsuspicious, at least moderate degenerative change of the shoulders.  IMPRESSION: Increasing interstitial prominence, with the right upper lobe and retrocardiac airspace opacities, this could reflect bronchopneumonia.  Stable cardiomegaly and fullness of the pulmonary hila which may be seen with pulmonary arterial hypertension.  Small left pleural effusion with fluid tracking along the right major fissure versus discoid atelectasis.   Electronically Signed   By: Awilda Metro   On: 12/31/2013 03:34    EKG:  AF with RVR, rate 112, normal axis, prolonged QT interval, normal QRS, no acute ischemic changes.   CRITICAL CARE Performed by: Brandt Loosen   Total critical care time: 19m  Critical care time was exclusive of separately billable procedures and treating other patients.  Critical care was necessary to treat or prevent imminent or life-threatening deterioration.  Critical care was time spent personally by me on the following activities: development of treatment plan with patient and/or surrogate as well as nursing, discussions with consultants, evaluation of patient's response to treatment, examination of patient, obtaining history from patient or surrogate, ordering and performing treatments and interventions, ordering and review of laboratory studies, ordering and review of radiographic studies, pulse oximetry and re-evaluation of patient's condition.   MDM   Patient with acute respiratory failure and CO2 narcosis. Treated with BiPAP with significant improvement in mental status. Patient now opens eyes to stimuli and responds to some verbal communication with her son. However, she remains in critical condition. We are treating with Zosyn and Vanc for HCAP. CXR is also concerning for component of pulmonary edema and BNP level is 7700. Thus, we are treating with IV lasix also.  Foley placed to monitor UOP.   Case discussed with FP resident who will see and admit to the SDU. Diagnosis, critical condition and poor prognosis discussed with the patient's son outside of her room and with her daughter over the telephone.   Final diagnoses:  Acute respiratory failure  Healthcare-associated pneumonia  Sepsis        Brandt Loosen, MD 12/31/13 [id number]

## 2013-12-31 NOTE — Assessment & Plan Note (Signed)
Regular rate and rhythm today.  Not being anticoagulated due to a high bleeding risk due to complete dependence on transfers.

## 2013-12-31 NOTE — ED Notes (Signed)
While this RN was in the pt's room, when the pt lifted her arm, opened her eyes, and said "oh mama" 3 times. Pt then went back to her obtunded state.

## 2013-12-31 NOTE — ED Notes (Signed)
Pt satting 87% on Bi-PAP. Lavella LemonsManly, MD made aware, and told this RN that she wanted to keep the pt at this saturation level to increase the pt's respiratory drive. RT is aware.

## 2014-01-01 DIAGNOSIS — I4891 Unspecified atrial fibrillation: Secondary | ICD-10-CM

## 2014-01-01 DIAGNOSIS — J189 Pneumonia, unspecified organism: Secondary | ICD-10-CM

## 2014-01-01 DIAGNOSIS — I369 Nonrheumatic tricuspid valve disorder, unspecified: Secondary | ICD-10-CM

## 2014-01-01 LAB — BASIC METABOLIC PANEL
BUN: 40 mg/dL — AB (ref 6–23)
CO2: 36 mEq/L — ABNORMAL HIGH (ref 19–32)
CREATININE: 1.48 mg/dL — AB (ref 0.50–1.10)
Calcium: 10.1 mg/dL (ref 8.4–10.5)
Chloride: 103 mEq/L (ref 96–112)
GFR calc Af Amer: 38 mL/min — ABNORMAL LOW (ref >90)
GFR calc non Af Amer: 32 mL/min — ABNORMAL LOW (ref >90)
Glucose, Bld: 93 mg/dL (ref 70–99)
POTASSIUM: 4.4 meq/L (ref 3.7–5.3)
Sodium: 150 mEq/L — ABNORMAL HIGH (ref 137–147)

## 2014-01-01 LAB — CBC
HEMATOCRIT: 37.6 % (ref 36.0–46.0)
Hemoglobin: 11.4 g/dL — ABNORMAL LOW (ref 12.0–15.0)
MCH: 29.2 pg (ref 26.0–34.0)
MCHC: 30.3 g/dL (ref 30.0–36.0)
MCV: 96.4 fL (ref 78.0–100.0)
Platelets: 179 10*3/uL (ref 150–400)
RBC: 3.9 MIL/uL (ref 3.87–5.11)
RDW: 17.3 % — ABNORMAL HIGH (ref 11.5–15.5)
WBC: 5.1 10*3/uL (ref 4.0–10.5)

## 2014-01-01 LAB — TROPONIN I: Troponin I: <0.3 ng/mL (ref <0.30)

## 2014-01-01 MED ORDER — FUROSEMIDE 10 MG/ML IJ SOLN
80.0000 mg | Freq: Two times a day (BID) | INTRAMUSCULAR | Status: DC
  Administered 2014-01-01 – 2014-01-02 (×4): 80 mg via INTRAVENOUS
  Filled 2014-01-01 (×7): qty 8

## 2014-01-01 MED ORDER — DEXTROSE 5 % IV SOLN
INTRAVENOUS | Status: DC
  Administered 2014-01-01: 14:00:00 via INTRAVENOUS

## 2014-01-01 NOTE — Progress Notes (Signed)
  Echocardiogram 2D Echocardiogram has been performed.  Georgian CoWILLIAMS, Rosealyn Little 01/01/2014, 11:12 AM

## 2014-01-01 NOTE — Discharge Summary (Signed)
Family Medicine Teaching [facility] Discharge Summary  Patient name: Tammy Salinas Medical record number: [id number] Date of birth: 1933/10/06 Age: 80 y.o. Gender: female Date of Admission: 12/31/2013  Date of Discharge: 01/04/14 Admitting Physician: Lora Paula, MD  Primary Care Provider: Gaspar Bidding, DO Consultants: none  Indication for Hospitalization: Acute on chronic respiratory failure, Acute encephalopathy, HCAP  Discharge Diagnoses/Problem List:  Acute Respiratory failure secondary to HCAP, considered sepsis w/o hypotension - Improved Hypervolemia, no known CHF - Resolved Acute encephalopathy, in setting of chronic dementia, likely secondary to infectious etiology (HCAP) - Resolved AKI, in setting of CKD Stage III - Resolved HTN Atrial Fibrillation, chronic Dementia / Depression / h/o tardive dyskinesia Hypothyroidism COPD H/o Anemia Poor PO intake, secondary to acute encephalopathy / dementia - Resolved  Disposition: SNF  Discharge Condition: Stable  Discharge Exam: Gen: sitting up in bed about to eat breakfast, 1L [place], pleasant, improved strength with speech, NAD  HEENT: MMM  CV: irregularly irregular, normal rate, no murmurs  PULM: Mostly CTAB without significant wheezing, crackles, or rhonchi. Improved air movement b/l. Normal WOB  ABD: soft, obese, NTND, +active BS  EXT: non-tender, improved trace pitting edema in ankles b/l, SCD's in place  Neuro - awake, alert, oriented (name, place, month, not day / year), dramatic improvement in strength of verbal communication, grossly non-focal with generalized chronic weakness  Brief Hospital Course: Tammy Salinas is a 80 y.o. female who presented with acute on chronic respiratory failure, found to have HCAP. PMH is significant for obesity, HTN, HLP, COPD, Hypothyroidism, Senile Dementia, Tardive Dyskinesia  # Acute Respiratory failure secondary to HCAP, considered sepsis w/o hypotension - Improved Presented  from SNF with fever, AMS, new 4L O2 req (no prior O2 req), received CTX x 1 dose, concern for pneumonia. In ED, found to have acute respiratory failure, likely secondary to new RUL HCAP on CXR, placed on BiPAP (as maximum airway intervention given DNI). Initial work-up with ABG (acidotic, hypercarbic / hypoxic), Lactic Acid (0.6), Trop-I (neg x 3), WBC (nml), blood cultures, UA / Ur Cx (no growth), hemodynamically stable w/o hypotension, appeared more volume overloaded (elevated ProBNP 7743). Admitted to SDU for continued BiPAP, started empiric Vanc / Cefepime IV. Overall, significant improvement in respiratory / mental status within first 48 hours, titrated down to from 4 to 1 L O2 [place], improved alertness / orientation (near baseline), remained afebrile and antibiotics transitioned to Levaquin PO 750mg  q 48 hr (start 2/26, last dose 3/8). Additionally, initial blood cultures had 1 tube grow coag neg staph species (contaminate) other tube no growth.  # Hypervolemia, no known CHF - Improved No known hx of CHF. Last ECHO was in 2008 (nml EF / no diastolic dysfunction). On admission, clinically volume overloaded, with ProBNP (7743, last 80, 5 months ago). Troponin-I (neg x 3). EKG no signs of ischemia. S/p Lasix IV 80mg  in ED. Wt 213 on admission. Closely monitored fluid status, overall good diuresis response to Lasix IV 80mg  BID initially with clinical improvement. ECHO, performed (01/01/14) - nml EF 55-60%, no diastolic dysfunction, moderate MR, mod pulm HTN. Transitioned Lasix to PO 40mg  daily prior to discharge. Recommend reducing to Lasix 20mg  daily, eventually titrate off if needed.  # Acute encephalopathy, in setting of chronic dementia, likely secondary to acute resp failure / HCAP - Resolved # Dementia / Depression / h/o tardive dyskinesia Reportedly at baseline able to communicate, however unable to perform ADLs / ambulate. On admission, acutely altered with minimal responsiveness (due  to respiratory  failure / infection), however significant improvement within first 48 hours following BiPAP, IV antibiotics, and diuresis. Continued improvement and prior to discharge with inc alertness, follows commands, and inc strength with communication. Remains weak / soft spoken, but oriented and with appropriate verbal responses. Note, acutely held psych / sedating medications (Seroquel, Cymbalta, Aricept, Ativan, Gabapentin) given mental status / acute hospitalization, especially given concern that recent increased Seroquel dose (60 vs previous 30mg ) may have contributed to respiratory depression / sedation. Continue to hold on discharge, re-evaluate need for resuming / titration up on admission to SNF.  # AKI, in setting of CKD Stage III Baseline Cr appears to be around 1.2 with GFR 50s. On admission elevated SCr to 1.82 / GFR 29 / BUN 39, indicative of acute pre-renal etiology, given volume status suspect possible cardio-renal insult with kidney hypoperfusion. Monitored SCr trend with daily improvement to Cr 1.19 / GFR 49 prior to discharge.  # HTN  Borderline low on admission, without hypotension. Remained stable during hospitalization, held home Amlodipine, continue to hold on discharge.  # Atrial Fibrillation, chronic Confirmed AFib on EKG on admission, HR remained stable w/o RVR. Currently no rate control or anticoagulation.  # Hypothyroidism Held home levothyroxine 50mcg initially due to NPO / AMS. Resumed prior to discharge.  # Hypernatremia, mild - Resolved  Initial normal Na at 146 --> inc to 150 --> improved 146. Likely secondary to intravascular volume depletion, cautious with IVF hydration, given overloaded initially on exam. Received D5W temporarily to correct Na, continued stable on improved PO intake.  # Poor PO intake, secondary to acute encephalopathy / dementia - Resolved SLP assessment determined no evidence of aspiration concerns. Continued dysphagia 3 diet, thin liquids. Nutrition  followed, consider supplements between meals if decreased PO.  # COPD - Stable  # H/o Anemia  Issues for Follow Up: 1. Complete Levaquin PO 750mg  q 48 hr (start 2/26, last dose 3/8) - total 14 day coverage for HCAP  2. Resp status / O2 requirement - Continue to titrate down O2 PRN (1L on discharge)  3. Fluid status - No dx CHF on ECHO. Continue Lasix 20mg  daily (can titrate off if needed).  4. Removed Foley - prior to discharge. Re-evaluate if needed on admission to SNF.  5. Mental status / Psych meds - Held during hospitalization given initial altered mental status. Consider resuming lower dose Seroquel. Following meds were held on admission / did not receive during hospitalization / stopped on DC: - Seroquel (75mg  daily in AM, except 62.5 on Wednesday) - (consider low dose daily) - Cymbalta 30mg  daily - Aricept 10mg  nightly - Ativan 0.25mg  PRN anxiety (9am - 6pm only) - Gabapentin 900mg  TID (held d/t sedation concern) (Continue Trazodone 25-50mg  PRN sleep - did not require during hospitalization)  Significant Procedures: none  Significant Labs and Imaging:   Recent Labs Lab 01/02/14 0300 01/03/14 0327 01/04/14 0328  WBC 6.6 6.6 6.6  HGB 10.6* 12.2 11.7*  HCT 35.5* 39.1 38.4  PLT 186 214 214    Recent Labs Lab 12/31/13 0253 12/31/13 1207 01/01/14 0854 01/02/14 0300 01/03/14 0327 01/04/14 0328  NA 146  --  150* 146 146 149*  K 5.4*  --  4.4 3.7 3.3* 4.2  CL 105  --  103 100 96 100  CO2 34*  --  36* 38* 39* 39*  GLUCOSE 139*  --  93 100* 82 96  BUN 39*  --  40* 40* 35* 31*  CREATININE 1.82*  1.58* 1.48* 1.37* 1.19* 1.30*  CALCIUM 9.6  --  10.1 10.0 10.5 10.5  ALKPHOS 66  --   --   --   --   --   AST 13  --   --   --   --   --   ALT 14  --   --   --   --   --   ALBUMIN 3.0*  --   --   --   --   --    ProBNP 7743  Troponin-I - negative x 3  UA - trace leuk, neg nitrite, mod Hgb, WBC 3-6, rare squam, many bacteria,  Blood culture (collected 2/23 --> No growth  (except 1 tube positive coag neg staph species - contaminate) Urine culture - No growth  Imaging/Diagnostic Tests:  2/23 CXR Portable 1v  IMPRESSION:  Increasing interstitial prominence, with the right upper lobe and  retrocardiac airspace opacities, this could reflect  bronchopneumonia.  Stable cardiomegaly and fullness of the pulmonary hila which may be  seen with pulmonary arterial hypertension.  Small left pleural effusion with fluid tracking along the right  major fissure versus discoid atelectasis.  2/24 2D ECHO  Left ventricle: The cavity size was normal. There was mild concentric hypertrophy. Systolic function was normal. The estimated ejection fraction was in the range of 55% to 60%. Although no diagnostic regional wall motion abnormality was identified, this possibility cannot be completely excluded on the basis of this study. - Mitral valve: Mild thickening, consistent with rheumatic disease. Mobility of the anterior and posterior leaflet was mildly restricted. The process was limited to the leaflet tips, and leaflet body motion was preserved.Diastolic leaflet doming was present. The findings are consistent with mild stenosis. Mean gradient: 7mm Hg (D). Gradients recorded at a mean heart rate of 100 bpm. Valve area by pressure half-time: 2.28cm^2. - Left atrium: The atrium was mildly to moderately dilated. - Tricuspid valve: Moderate regurgitation. - Pulmonary arteries: Systolic pressure was moderately increased. PA peak pressure: 54mm Hg (S).  Results/Tests Pending at Time of Discharge: none  Discharge Medications:    Medication List    STOP taking these medications       amLODipine 10 MG tablet  Commonly known as:  NORVASC     CYMBALTA 30 MG capsule  Generic drug:  DULoxetine     donepezil 10 MG tablet  Commonly known as:  ARICEPT     gabapentin 300 MG tablet  Commonly known as:  NEURONTIN     LORazepam 0.5 MG tablet  Commonly known as:  ATIVAN      QUEtiapine 25 MG tablet  Commonly known as:  SEROQUEL      TAKE these medications       acetaminophen 500 MG tablet  Commonly known as:  TYLENOL  Take 2 tablets (1,000 mg total) by mouth 3 (three) times daily.     albuterol (2.5 MG/3ML) 0.083% nebulizer solution  Commonly known as:  PROVENTIL  Take 2.5 mg by nebulization every 4 (four) hours as needed for shortness of breath.     allopurinol 300 MG tablet  Commonly known as:  ZYLOPRIM  Take 1 tablet (300 mg total) by mouth daily.     aspirin EC 325 MG tablet  Take 325 mg by mouth daily.     bisacodyl 10 MG suppository  Commonly known as:  DULCOLAX  Place 10 mg rectally daily as needed for moderate constipation.     BROVANA 15 MCG/2ML Nebu  Generic drug:  arformoterol  Take 2 mLs (15 mcg total) by nebulization 2 (two) times daily.     Cholecalciferol 50000 UNITS capsule  Take 50,000 Units by mouth every 30 (thirty) days. Given on 14th of month.     colchicine 0.6 MG tablet  Take 1 tablet (0.6 mg total) by mouth daily.     levofloxacin 750 MG tablet  Commonly known as:  LEVAQUIN  Take 1 tablet (750 mg total) by mouth every other day.  Start taking on:  01/05/2014     levothyroxine 50 MCG tablet  Commonly known as:  SYNTHROID, LEVOTHROID  Take 1 tablet (50 mcg total) by mouth daily.     LIPITOR 10 MG tablet  Generic drug:  atorvastatin  Take 1 tablet (10 mg total) by mouth at bedtime.     loratadine 10 MG tablet  Commonly known as:  CLARITIN  Take 10 mg by mouth daily as needed.     MUCINEX FOR KIDS PO  Take 500 mg by mouth 2 (two) times daily. Kids Mini-melts- Give 1 pack BID.     guaiFENesin 100 MG/5ML liquid  Commonly known as:  ROBITUSSIN  Take 400 mg by mouth every 6 (six) hours as needed for cough.     multivitamin tablet  Take 1 tablet by mouth daily.     omeprazole 20 MG capsule  Commonly known as:  PRILOSEC  Take 40 mg by mouth daily.     polyethylene glycol packet  Commonly known as:   MIRALAX / GLYCOLAX  Take 17 g by mouth every other day. Mix into 8 ounces fluid for constipation     PULMICORT 0.25 MG/2ML nebulizer solution  Generic drug:  budesonide  Take 0.25 mg by nebulization 2 (two) times daily. prior to brovana     RESTASIS 0.05 % ophthalmic emulsion  Generic drug:  cycloSPORINE  Place 1 drop into both eyes 2 (two) times daily.     SYSTANE BALANCE 0.6 % Soln  Generic drug:  Propylene Glycol  Place 1 drop into both eyes 2 (two) times daily.     traZODone 50 MG tablet  Commonly known as:  DESYREL  Take 25-50 mg by mouth at bedtime as needed for sleep. Take 1/2 tablet (25 mg) by mouth if needed for sleep, then 50mg  1 hour later if ineffective.        Discharge Instructions: Please refer to Patient Instructions section of EMR for full details.  Patient was counseled important signs and symptoms that should prompt return to medical care, changes in medications, dietary instructions, activity restrictions, and follow up appointments.   Follow-Up Appointments: Follow-up Information   Follow up with RIGBY, MICHAEL, DO In 1 week. (As needed)    Specialty:  Family Medicine   Contact information:   [address] [place] [address] [phone]       Saralyn Pilar, DO 01/04/2014, 12:20 PM PGY-1, [facility] Family [facility]

## 2014-01-01 NOTE — Evaluation (Signed)
Clinical/Bedside Swallow Evaluation Patient Details  Name: Tammy Salinas MRN: [id number] Date of Birth: 02-16-1933  Today's Date: 01/01/2014 Time: [id number] SLP Time Calculation (min): 23 min  Past Medical History:  Past Medical History  Diagnosis Date  . Schizophrenia     Rx Seroquel, Aricept, Cymbalta. MMSE 22/30.  Marland Kitchen. Dementia   . Hyperlipidemia   . Depression   . GERD (gastroesophageal reflux disease)   . Chronic kidney disease (CKD)     Creatinine 1.15 (01/21/11).  . Hypothyroid   . Chronic pain     Dx Gout, Back Pain, Hx PMR - Continues to c/o pain chronically. On Tylenol, Gabapentin, and Oxycontin.  Weaned from prednisone with no change in function. Now in OT 3x/week. NOTE: PATIENT HAS CHRONIC RIGHT HAND PAIN.  Marland Kitchen. Hemorrhoids     Rx Miralax, Tucks, Hydrocortisone.   . Cystitis, chronic     Pt with Hx multiple UTIs. Followed by [facility]. Do not get UA unless systemic symptoms.  . Femur fracture, left     Pt with fall 4/409 with L femur frx; s/p ORIF 02/14/08 by Dr Shon BatonBrooks.  No longer on coumadin. Repeat xray of leg per ortho showed separation at repair site. Pt no longer ambulatory. Rx ASA, Calcium, Vitamin D, Bisphosphonate.  . Hypothyroid     Rx Synthroid.  Marland Kitchen. Hypertension     Rx Norvasc.  Marland Kitchen. Dysphagia   . Fracture, humerus, proximal 08/2009    Non-union  . RESTLESS LEG SYNDROME 05/26/2007  . HIP FRACTURE, LEFT 02/29/2008    Qualifier: Diagnosis of  By: Corliss MarcusEED CRAWFORD MD, MAKEECHA    . PRESSURE ULCER OTHER SITE 06/10/2010    Qualifier: Diagnosis of  By: Sheffield SliderHale MD, Deniece PortelaWayne    . GOUT 05/26/2007  . Hyperkalemia 07/19/2013  . Pain in joint, ankle and foot 02/07/2013  . Polymyalgia rheumatica 05/26/2007  . SPONDYLOSIS, LUMBAR W/MYELOPATHY 05/26/2007  . SPONDYLOSIS, CERVICAL W/MYELOPATHY 05/26/2007    Quadriparetic due to this and lumbar stenosis   . WRIST PAIN, RIGHT 10/08/2009  . DEGENERATIVE JOINT DISEASE, BOTH KNEES, SEVERE 05/26/2007  . HEMORRHOIDS 05/05/2010    Qualifier:  Diagnosis of  By: Quincy CarnesWallace DO, Erica    . URINARY TRACT INFECTION, RECURRENT 05/31/2008   Past Surgical History:  Past Surgical History  Procedure Laterality Date  . Total abdominal hysterectomy      left  . Orif hip fracture  2009    leftf  . Humeral arthroplasty  08/15/09    left  . Posterior laminectomy / decompression lumbar spine      L 1-2  . Umbilical hernia repair     HPI:  Tammy Salinas is a 80 y.o. female presenting with respiratory failure secondary to HCAP. PMH is significant for obesity, HTN, HLP, COPD, Hypothyroidism, Senile Dementia, Tardive Dyskinesia   Assessment / Plan / Recommendation Clinical Impression  Pt presents with mildly reduced hyolaryngeal movement upon palpation but with no overt s/s of aspiration throughout PO intake with thorough challenging. Pt demonstrated prolonged mastication with Dys 3 textures likely due to edentulous state, and may initially benefit from mechanical soft textures for energy conservation. Recommend to initiate Dys 3 diet and thin liquids. SLP to follow for tolerance and advancement.    Aspiration Risk  Mild    Diet Recommendation Dysphagia 3 (Mechanical Soft);Thin liquid   Liquid Administration via: Cup;Straw Medication Administration: Whole meds with liquid Supervision: Staff to assist with self feeding;Intermittent supervision to cue for compensatory strategies Compensations: Slow rate;Small sips/bites Postural Changes  and/or Swallow Maneuvers: Seated upright 90 degrees;Upright 30-60 min after meal    Other  Recommendations Oral Care Recommendations: Oral care BID   Follow Up Recommendations  Skilled Nursing facility    Frequency and Duration min 2x/week  1 week   Pertinent Vitals/Pain N/A    SLP Swallow Goals     Swallow Study Prior Functional Status       General Date of Onset: 12/31/13 HPI: Tammy Salinas is a 80 y.o. female presenting with respiratory failure secondary to HCAP. PMH is significant for  obesity, HTN, HLP, COPD, Hypothyroidism, Senile Dementia, Tardive Dyskinesia Type of Study: Bedside swallow evaluation Previous Swallow Assessment: none in chart; son reports SLP at SNF evaluating her last year with pt maintaining regular textures and thin liquids Diet Prior to this Study: NPO Temperature Spikes Noted: No Respiratory Status: Nasal cannula (4 L) History of Recent Intubation: No Behavior/Cognition: Alert;Cooperative;Pleasant mood;Requires cueing Oral Cavity - Dentition: Edentulous Self-Feeding Abilities: Total assist Patient Positioning: Upright in bed Baseline Vocal Quality: Aphonic Volitional Swallow: Unable to elicit    Oral/Motor/Sensory Function Overall Oral Motor/Sensory Function: Appears within functional limits for tasks assessed (tardive dyskinesia)   Ice Chips Ice chips: Not tested   Thin Liquid Thin Liquid: Impaired Presentation: Cup;Straw Pharyngeal  Phase Impairments: Decreased hyoid-laryngeal movement    Nectar Thick Nectar Thick Liquid: Not tested   Honey Thick Honey Thick Liquid: Not tested   Puree Puree: Impaired Pharyngeal Phase Impairments: Decreased hyoid-laryngeal movement   Solid   GO    Solid: Impaired Oral Phase Impairments: Impaired mastication (pt edentulous, likely has prolonged mastication at baseline) Pharyngeal Phase Impairments: Decreased hyoid-laryngeal movement        Maxcine Ham, M.A. CCC-SLP [phone]  Maxcine Ham 01/01/2014,2:52 PM

## 2014-01-01 NOTE — Progress Notes (Signed)
CRITICAL VALUE ALERT  Critical value received:  aerobic gram positive cocci in clusters  Date of notification:  01/01/2014  Time of notification:  1500  Critical value read back: Yes  Nurse who received alert:  SwazilandJordan Dencil Cayson RN  MD notified (1st page):  Saralyn PilarAlexander Karamalegos  Time of first page:  1530  Time MD responded:  1540

## 2014-01-01 NOTE — Progress Notes (Signed)
INITIAL NUTRITION ASSESSMENT  DOCUMENTATION CODES Per approved criteria  -Not Applicable   INTERVENTION: 1.  Modify diet; resume PO diet once medically appropriate per MD discretion.  NUTRITION DIAGNOSIS: Inadequate oral intake related to inability to eat as evidenced by NPO, respiratory difficulty.   Monitor:  1.  Food/Beverage; diet advanced with pt meeting >/=90% estimated needs with tolerance. 2.  Wt/wt change; monitor trends  Reason for Assessment: vent  80 y.o. female  Admitting Dx: unresponsive  ASSESSMENT: Pt found unresponsive at [facility]. Pt alert and lying in bed at time of visit.  Family at bedside state that pt was eating well and per her usual PTA.  They state pt typically eats "what you give her, especially if she likes it."  She also likes to snack.  They state pt's usual weight is 220 lbs but she does tend to fluctuate around this number.   Family states pt does have dentures, however she rarely wears them when eating.  They state she was on a regular diet PTA.  She does not have her dentures with her.   Nutrition Focused Physical Exam:  Subcutaneous Fat:  Orbital Region: WNL Upper Arm Region: WNL Thoracic and Lumbar Region: WNL  Muscle:  Temple Region: WNL Clavicle Bone Region: WNL Clavicle and Acromion Bone Region: WNL Scapular Bone Region: WNL Dorsal Hand: WNL Patellar Region: WNL Anterior Thigh Region: WNL Posterior Calf Region: WNL  Edema: none present  Height: Ht Readings from Last 1 Encounters:  12/31/13 5\' 7"  (1.702 m)    Weight: Wt Readings from Last 1 Encounters:  12/31/13 218 lb 7.6 oz (99.1 kg)    Ideal Body Weight: 61.3 kg  % Ideal Body Weight: 161%  Wt Readings from Last 10 Encounters:  12/31/13 218 lb 7.6 oz (99.1 kg)  09/10/13 219 lb 3.2 oz (99.428 kg)  02/10/13 217 lb (98.431 kg)  01/18/13 205 lb (92.987 kg)  11/22/12 211 lb (95.709 kg)  08/05/12 216 lb (97.977 kg)  05/13/12 213 lb 6.4 oz (96.798 kg)  03/08/12 214 lb 8  oz (97.297 kg)  01/07/12 220 lb (99.791 kg)  09/09/11 222 lb 12.8 oz (101.061 kg)    Usual Body Weight: 220 lbs  % Usual Body Weight: 99%  BMI:  Body mass index is 34.21 kg/(m^2).  Estimated Nutritional Needs: Kcal: 1700-1850 Protein: 61-73g Fluid: ~1.8 L/day  Skin: intact  Diet Order: NPO  EDUCATION NEEDS: -No education needs identified at this time   Intake/Output Summary (Last 24 hours) at 01/01/14 1114 Last data filed at 01/01/14 0800  Gross per 24 hour  Intake    200 ml  Output   1325 ml  Net  -1125 ml    Last BM: 2/23  Labs:   Recent Labs Lab 12/31/13 0253 12/31/13 1207 01/01/14 0854  NA 146  --  150*  K 5.4*  --  4.4  CL 105  --  103  CO2 34*  --  36*  BUN 39*  --  40*  CREATININE 1.82* 1.58* 1.48*  CALCIUM 9.6  --  10.1  GLUCOSE 139*  --  93    CBG (last 3)   Recent Labs  12/31/13 0242  GLUCAP 128*    Scheduled Meds: . ceFEPime (MAXIPIME) IV  2 g Intravenous Q24H  . heparin  5,000 Units Subcutaneous 3 times per day  . sodium chloride  3 mL Intravenous Q12H  . vancomycin  1,250 mg Intravenous Q24H    Continuous Infusions:   Past Medical  History  Diagnosis Date  . Schizophrenia     Rx Seroquel, Aricept, Cymbalta. MMSE 22/30.  Marland Kitchen. Dementia   . Hyperlipidemia   . Depression   . GERD (gastroesophageal reflux disease)   . Chronic kidney disease (CKD)     Creatinine 1.15 (01/21/11).  . Hypothyroid   . Chronic pain     Dx Gout, Back Pain, Hx PMR - Continues to c/o pain chronically. On Tylenol, Gabapentin, and Oxycontin.  Weaned from prednisone with no change in function. Now in OT 3x/week. NOTE: PATIENT HAS CHRONIC RIGHT HAND PAIN.  Marland Kitchen. Hemorrhoids     Rx Miralax, Tucks, Hydrocortisone.   . Cystitis, chronic     Pt with Hx multiple UTIs. Followed by [facility]. Do not get UA unless systemic symptoms.  . Femur fracture, left     Pt with fall 4/409 with L femur frx; s/p ORIF 02/14/08 by Dr Shon BatonBrooks.  No longer on coumadin. Repeat xray  of leg per ortho showed separation at repair site. Pt no longer ambulatory. Rx ASA, Calcium, Vitamin D, Bisphosphonate.  . Hypothyroid     Rx Synthroid.  Marland Kitchen. Hypertension     Rx Norvasc.  Marland Kitchen. Dysphagia   . Fracture, humerus, proximal 08/2009    Non-union  . RESTLESS LEG SYNDROME 05/26/2007  . HIP FRACTURE, LEFT 02/29/2008    Qualifier: Diagnosis of  By: Corliss MarcusEED CRAWFORD MD, MAKEECHA    . PRESSURE ULCER OTHER SITE 06/10/2010    Qualifier: Diagnosis of  By: Sheffield SliderHale MD, Deniece PortelaWayne    . GOUT 05/26/2007  . Hyperkalemia 07/19/2013  . Pain in joint, ankle and foot 02/07/2013  . Polymyalgia rheumatica 05/26/2007  . SPONDYLOSIS, LUMBAR W/MYELOPATHY 05/26/2007  . SPONDYLOSIS, CERVICAL W/MYELOPATHY 05/26/2007    Quadriparetic due to this and lumbar stenosis   . WRIST PAIN, RIGHT 10/08/2009  . DEGENERATIVE JOINT DISEASE, BOTH KNEES, SEVERE 05/26/2007  . HEMORRHOIDS 05/05/2010    Qualifier: Diagnosis of  By: Quincy CarnesWallace DO, Erica    . URINARY TRACT INFECTION, RECURRENT 05/31/2008    Past Surgical History  Procedure Laterality Date  . Total abdominal hysterectomy      left  . Orif hip fracture  2009    leftf  . Humeral arthroplasty  08/15/09    left  . Posterior laminectomy / decompression lumbar spine      L 1-2  . Umbilical hernia repair      Loyce DysKacie Raydel Hosick, MS RD LDN Clinical Inpatient Dietitian Pager: [phone] Weekend/After hours pager: [phone]

## 2014-01-01 NOTE — Progress Notes (Signed)
Family Medicine Teaching Service Daily Progress Note Intern Pager: [phone]  Patient name: Tammy Salinas Medical record number: [id number] Date of birth: Aug 27, 1933 Age: 80 y.o. Gender: female  Primary Care Provider: Gaspar Bidding, DO Consultants: none Code Status: DNR/DNI  Pt Overview and Major Events to Date:  2/23 - admitted, acute resp failure / AMS, required BiPAP, RUL HCAP, Vanc / Cefepime IV 2/24 - 4L [place] (>99%), mild improved mental status (decreased communication)  Assessment and Plan: Tammy Salinas is a 80 y.o. female presenting with respiratory failure secondary to HCAP. PMH is significant for obesity, HTN, HLP, COPD, Hypothyroidism, Senile Dementia, Tardive Dyskinesia  # Acute Respiratory failure secondary to HCAP, sepsis and volume overload - Improved New acute respiratory change on admission (12/31/13), likely secondary to new RUL HCAP on repeat CXR. No prior O2 req at SNF. Prior to arrival s/p Rocephin x 1 dose. In ED pt observed to have 4 breath per min upon arrival and was placed in BIPAP. Pt is DNI/DNR. Additionally, possible UTI (trace leuk, WBC 3-6) - remains SDU status, telemetry - improved O2 req, currently on 4L [place] (sat >99%), off BiPAP (2/23) - f/u fever curve (last fever 2/23 to 101.1 @ 1100), afebrile x 24 hours - variable WBC trend 5.1-->3.7-->5.1 - continue BSA empiric antibiotics for HCAP, if afebrile and clinically improved can narrow within 48 hours     - Vancomycin IV (2/23>>)     - Cefepime IV (2/23>>)  - variable WBC trend 5.1-->3.7-->5.1 - f/u blood cultures (collected 2/23 --> NGTD) - ordered urine culture (previously collected 2/23) --> prior to abx  # Concern for volume overload: pt with not known hx of CHF. Last ECHO was in 2008 with Normal EF and no diastolic dysfunction. ProBNP has been in the ~79 range 5 months ago. Today BNP is 7743 with 1 troponin negative. EKG no signs of ischemia. Afib rate controlled. - s/p Lasix IV 80mg  x 1 in ED, repeat  80 IV dose given yesterday PM - fluid status still appears elevated, BP stable, Cr improved - continue diuresis with Lasix IV 80mg  BID - 2D ECHO, performed (01/01/14) - pending result - troponin negative x 3 - f/u strict I/O (decent UOP - 1L yesterday), daily wts (pending wt today)  # HTN: BP bordeline low.  - Will hold BP medication at this time.  - Telemetry  # Afib: normal rate.  No on rate Rate control medication. No on anticoagulation  - will continue to monitor.  # Hx of anemia. No signs of acute bleeding  - Hgb 12.5 on admission - stable trend 11.8-->11.4  # AKI in setting of CKD: GFR on 07/2013 was 34. It was reported to be acute that resolved. Today is 29. Cr is 1.82, baseline seemed to be 1.2. Diuresis may help with possible cardio-renal insult. K is 5.4 without EKG changes for hyperkalemia.  - Monitor Cr, improving trend 1.82-->1.58-->1.48 - elevated BUN 39-->40, and BUN:Cr >27, suggestive of pre-renal etiology - will consider Nephrology consult if no improvement with current treatment.  # Hypothyroidism  On levothyroxine : will hold when pt is not able to tolerate PO  # hx of COPD, no significant wheezing on exam. Will treat with PRN duoneb  - received a dose of solumedrol at Nursing home.  # Acute encephalopathy, in setting of chronic dementia, likely infectious etiology 2/2 HCAP Reportedly at baseline able to communicate, however unable to perform ADLs / ambulate - currently patient improved alertness and attempting communication,  but difficult to understand - disoriented at this time - hold sedating psych meds at this time.  # Dementia, Depression, Tardive dyskinesia  On Aricept, Cymbalta, Seroquel, and Ativan PRN. Will hold until pt is more awake and no longer altered.  # Hypernatremia, mild - initial normal Na at 146 --> increased to 150 - likely secondary to dehydration, cautious with IVF hydration, given overloaded on exam and pending ECHO - start D5-W  @ 50cc/hr, x 24 hours, titrate if needed  FEN/GI: NPO/ 1 bolus NS, then KVO - D5-W @ 50cc/hr, x 24 hours  Prophylaxis: Heparin  Disposition: currently SDU status, pending further work-up / monitoring / clinical improvement, expect to be discharged back to SNF, remain hospitalized >2 days  Subjective: No acute events overnight. Per nursing, able to transition from BiPAP yesterday to 4L [place] (O2 sat >99%), note no prior O2 req. This morning no family at bedside. Patient awake, alert, but very weak and difficult communication. Understands / follows most commands, and unable to appreciate orientation.  Objective: Temp:  [97.4 F (36.3 C)-100.9 F (38.3 C)] 98.5 F (36.9 C) (02/24 0800) Pulse Rate:  [47-103] 85 (02/24 0800) Resp:  [14-29] 14 (02/24 0800) BP: (82-142)/(51-89) 114/79 mmHg (02/24 0800) SpO2:  [95 %-100 %] 99 % (02/24 0800) FiO2 (%):  [36 %] 36 % (02/23 1734) Physical Exam: Gen: laying in bed on O2 [place], awakens to voice and touch, mouthing words/faint speech, NAD  HEENT: mild dry MM. Neck supple. No JVD apreciated.  CV: irregularly irregular, no murmurs rubs or gallops  PULM: Diffusely diminished breath sounds with bilateral coarse rhonchi, non-focal, limited participation in resp exam. Normal WOB, no tachypnea or retractions. ABD: soft, generalized mild pain response to palpation, non-distended, +active BS  EXT: non-tender, +1 pitting edema in ankles b/l, SCD's in place Neuro - awake, alert, disoriented (attempts to answer verbally, but non-verbal yes/no, indicates disorientation), grossly non-focal, muscle strength 4/5 b/l upper and lower ext, limited cooperation.  Laboratory:  Recent Labs Lab 12/31/13 0800 12/31/13 1207 01/01/14 0854  WBC 5.1 3.7* 5.1  HGB 12.5 11.8* 11.4*  HCT 41.2 39.2 37.6  PLT 136* 160 179    Recent Labs Lab 12/31/13 0253 12/31/13 1207 01/01/14 0854  NA 146  --  150*  K 5.4*  --  4.4  CL 105  --  103  CO2 34*  --  36*  BUN 39*  --  40*   CREATININE 1.82* 1.58* 1.48*  CALCIUM 9.6  --  10.1  PROT 6.9  --   --   BILITOT 0.2*  --   --   ALKPHOS 66  --   --   ALT 14  --   --   AST 13  --   --   GLUCOSE 139*  --  93   ProBNP 7743 Troponin-I - negative x 3 UA - trace leuk, neg nitrite, mod Hgb, WBC 3-6, rare squam, many bacteria,   Blood culture (collected 2/23 --> NGTD) Urine culture - not collected initially, re-order  Imaging/Diagnostic Tests:  2/23 CXR Portable 1v IMPRESSION:  Increasing interstitial prominence, with the right upper lobe and  retrocardiac airspace opacities, this could reflect  bronchopneumonia.  Stable cardiomegaly and fullness of the pulmonary hila which may be  seen with pulmonary arterial hypertension.  Small left pleural effusion with fluid tracking along the right  major fissure versus discoid atelectasis.  Saralyn PilarAlexander Khai Arrona, DO 01/01/2014, 12:11 PM PGY-1, [facility] FPTS Intern pager: [phone], text  pages welcome

## 2014-01-01 NOTE — Progress Notes (Signed)
FMTS Attending  Note: Angely Dietz,MD I  have seen and examined this patient, reviewed their chart. I have discussed this patient with the resident. I agree with the resident's findings, assessment and care plan.  

## 2014-01-02 LAB — BASIC METABOLIC PANEL
BUN: 40 mg/dL — ABNORMAL HIGH (ref 6–23)
CALCIUM: 10 mg/dL (ref 8.4–10.5)
CO2: 38 mEq/L — ABNORMAL HIGH (ref 19–32)
Chloride: 100 mEq/L (ref 96–112)
Creatinine, Ser: 1.37 mg/dL — ABNORMAL HIGH (ref 0.50–1.10)
GFR calc non Af Amer: 36 mL/min — ABNORMAL LOW (ref >90)
GFR, EST AFRICAN AMERICAN: 41 mL/min — AB (ref >90)
GLUCOSE: 100 mg/dL — AB (ref 70–99)
Potassium: 3.7 mEq/L (ref 3.7–5.3)
SODIUM: 146 meq/L (ref 137–147)

## 2014-01-02 LAB — CBC WITH DIFFERENTIAL/PLATELET
Basophils Absolute: 0 10*3/uL (ref 0.0–0.1)
Basophils Relative: 0 % (ref 0–1)
EOS ABS: 0 10*3/uL (ref 0.0–0.7)
Eosinophils Relative: 1 % (ref 0–5)
HCT: 35.5 % — ABNORMAL LOW (ref 36.0–46.0)
Hemoglobin: 10.6 g/dL — ABNORMAL LOW (ref 12.0–15.0)
LYMPHS ABS: 1.8 10*3/uL (ref 0.7–4.0)
Lymphocytes Relative: 27 % (ref 12–46)
MCH: 28.9 pg (ref 26.0–34.0)
MCHC: 29.9 g/dL — ABNORMAL LOW (ref 30.0–36.0)
MCV: 96.7 fL (ref 78.0–100.0)
Monocytes Absolute: 0.8 10*3/uL (ref 0.1–1.0)
Monocytes Relative: 12 % (ref 3–12)
NEUTROS PCT: 60 % (ref 43–77)
Neutro Abs: 4 10*3/uL (ref 1.7–7.7)
Platelets: 186 10*3/uL (ref 150–400)
RBC: 3.67 MIL/uL — ABNORMAL LOW (ref 3.87–5.11)
RDW: 17 % — ABNORMAL HIGH (ref 11.5–15.5)
WBC: 6.6 10*3/uL (ref 4.0–10.5)

## 2014-01-02 LAB — URINE CULTURE
CULTURE: NO GROWTH
Colony Count: NO GROWTH

## 2014-01-02 NOTE — Progress Notes (Signed)
FMTS ATTENDING NOTE Tammy Rivers,MD I  have seen and examined this patient, reviewed their chart. I have discussed this patient with the resident. I agree with the resident's findings, assessment and care plan.  Patient is doing well in general, responding well to treatment. Blood culture report reviewed with one bottle positive for gram + cocci, this could be a contaminant but we will wait for final report, for now we plan to continue antibiotic.Her value status improved as well as her kidney function, requirement for O2 via [place] is reducing. Continue to monitor for improvement.

## 2014-01-02 NOTE — Progress Notes (Addendum)
Clinical Social Work Department CLINICAL SOCIAL WORK PLACEMENT NOTE 01/02/2014  Patient:  [name],Tammy C  Account Number:  [id number] Admit date:  12/31/2013  Clinical Social Worker:  Maryclare LabradorJULIE ANDERSON, Theresia MajorsLCSWA  Date/time:  01/02/2014 03:21 PM  Clinical Social Work is seeking post-discharge placement for this patient at the following level of care:   SKILLED NURSING   (*CSW will update this form in Epic as items are completed)   N/A-[facility] and [facility]. Referral only sent to [facility]. Patient/family provided with [facility] Department of Clinical Social Work's list of facilities offering this level of care within the geographic area requested by the patient (or if unable, by the patient's family).  01/02/2014  Patient/family informed of their freedom to choose among providers that offer the needed level of care, that participate in Medicare, Medicaid or managed care program needed by the patient, have an available bed and are willing to accept the patient.  CSW will inform family of this if pt gets a bed at this SNF Patient/family informed of [facility]' ownership interest in [facility], as well as of the fact that they are under no obligation to receive care at this facility.  PASARR submitted to EDS on EXISTING PASARR number received from EDS on EXISTING  FL2 transmitted to all facilities in geographic area requested by pt/family on  01/02/2014 FL2 transmitted to all facilities within larger geographic area on   Patient informed that his/her managed care company has contracts with or will negotiate with  certain facilities, including the following:     Patient/family informed of bed offers received:  01/03/14 [facility] Physician recommends and patient chooses bed at    Patient to be transferred to  on  01/04/14 Patient to be transferred to facility by [facility]  The following physician request were entered in  Epic:   Additional Comments:   Maryclare LabradorJulie Anderson, MSW, [facility] Clinical Social Worker [phone]

## 2014-01-02 NOTE — Progress Notes (Signed)
Speech Language Pathology Treatment: Dysphagia  Patient Details Name: Tammy Salinas MRN: [id number] DOB: 1933-02-21 Today's Date: 01/02/2014 Time: 7505-1071 SLP Time Calculation (min): 8 min  Assessment / Plan / Recommendation Clinical Impression  Pt finishing up am meal upon SLp arrival. Softer prepared texture aiding pt in self feeding and facilitating mastication and conserving energy. No evidence of aspiration observed though suspect there is a slight delay in swallow initiation subjectively. Recommend pt continue current diet through d/c and possibly beyond. No SLP f/u needed in this setting as pt is tolerating well. Will sign off.    HPI HPI: Tammy Salinas is a 80 y.o. female presenting with respiratory failure secondary to HCAP. PMH is significant for obesity, HTN, HLP, COPD, Hypothyroidism, Senile Dementia, Tardive Dyskinesia   Pertinent Vitals NA  SLP Plan  All goals met;Discharge SLP treatment due to (comment)    Recommendations Diet recommendations: Dysphagia 3 (mechanical soft);Thin liquid Liquids provided via: Cup;Straw Medication Administration: Whole meds with liquid Supervision: Staff to assist with self feeding;Intermittent supervision to cue for compensatory strategies Compensations: Slow rate;Small sips/bites Postural Changes and/or Swallow Maneuvers: Seated upright 90 degrees;Upright 30-60 min after meal              Oral Care Recommendations: Oral care BID Follow up Recommendations: Skilled Nursing facility Plan: All goals met;Discharge SLP treatment due to (comment)    GO    Herbie Baltimore, MA CCC-SLP [phone]  Lynann Beaver 01/02/2014, 8:57 AM

## 2014-01-02 NOTE — Progress Notes (Addendum)
Clinical Social Work Department BRIEF PSYCHOSOCIAL ASSESSMENT 01/02/2014  Patient:  Tammy Salinas,Tammy Salinas     Account Number:  [id number]     Admit date:  12/31/2013  Clinical Social Worker:  Varney BilesANDERSON,Bettina Warn, LCSWA  Date/Time:  01/02/2014 01:24 PM  Referred by:  Physician  Date Referred:  01/02/2014 Referred for  SNF Placement   Other Referral:   Interview type:  Family Other interview type:    PSYCHOSOCIAL DATA Living Status:  FACILITY Admitted from facility[facility] Level of care:  Skilled Nursing Facility Primary support name:  Tammy PertBelinda Salinas [phone]) Primary support relationship to patient:  CHILD, ADULT Degree of support available:   Good--[facility], per daughter Tammy Salinas, and pt receives support from son and daughter.    CURRENT CONCERNS Current Concerns  Post-Acute Placement   Other Concerns:    SOCIAL WORK ASSESSMENT / PLAN CSW called pt's daughter Tammy Salinas and introduced self, explaining role in discharge back to [facility]. Tammy Salinas states [facility] a [facility] yeas, but the family is not sure if they want pt to return. CSW spoke at length with Tammy Salinas about pt's experience at [facility], and Tammy Salinas says her brother has especially been suspect of the quality of care pt receives at [facility]. Tammy Salinas explains the doctors that see pt at the SNF recently altered her medications and started her on a medication that Tammy Salinas believes pt should never have been put on because it has negative side effects for pts with heart conditions. CSW provided support and explained CSW is happy to make referrals to other facilities--Tammy states she will call her brother when she is off work and speak to him about which facilities they might be interested in specifically. CSW explained even if pt discharges back to [facility], the facility can help them transition to a different facility. Tammy Salinas states she is happy to hear this.  CSW invited Tammy Salinas or her brother Tammy Salinas to call CSW on CSW's work cell. CSW explained CSW leaves at 4:30pm, but if the family calls after this time to leave a voicemail and CSW will follow up as soon as CSW is back to work the next day. CSW explained that due to impending winter weather tomorrow, CSW might not be at work. However, CSW will leave a handoff letting whoever is covering know about the family's wishes to potentially switch facilities. CSW explained that the worst case scenario is CSW follows up with family on Friday. Tammy thanked CSW for assistance and expressed understanding of conversation with CSW.  Addendum: CSW received call from pt's son Tammy Salinas. Lawrence requested [name]. CSW has done this. CSW asked if there are any other facilities the family is interested in, and Tammy Salinas explained that there are not--[facility] so [facility] her at this SNF. CSW will update Tammy Salinas when CSW hears back from [facility]. Lawrence understands that he might not hear back from CSW tomorrow due to inclement weather.    Assessment/plan status:  Psychosocial Support/Ongoing Assessment of Needs Other assessment/ plan:   Information/referral to community resources:   [facility]NF [name]    PATIENT'S/FAMILY'S RESPONSE TO PLAN OF CARE: Good--pt's daughter Tammy Salinas friendly in phone conversation and expressed understanding of CSW role. Tammy thanked CSW for assistance with potentially finding another facility, and CSW has invited family to call with the names of specific facilities family is interested in--CSW assured Tammy Salinas that even if she [facility] back [facility],  she can be transferred to another facility if this is what the family wishes to do. Tammy expressed she is happy to know that pt is not "stuck" at [facility] if she returns. CSW assured Tammy and addressed her concerns, inviting her to call anytime.  Addendum: Pt's son Tammy Bishop friendly in phone  conversation with CSW and expressed appreciation of CSW assistance. CSW explained that if a bed is not available at [facility] but they can clinically accept her, pt can be discharged back to [facility] and the family can work with [place] on getting her over to [place]. CSW to update Tammy Bishop when [place] responds to bed request.     Maryclare Labrador, MSW, [facility] Clinical Social Worker [phone]

## 2014-01-02 NOTE — Progress Notes (Signed)
Family Medicine Teaching Service Daily Progress Note Intern Pager: [phone]  Patient name: Tammy Salinas Medical record number: [id number] Date of birth: May 21, 1933 Age: 80 y.o. Gender: female  Primary Care Provider: Gaspar BiddingIGBY, MICHAEL, DO Consultants: none Code Status: DNR/DNI  Pt Overview and Major Events to Date:  2/23 - admitted, acute resp failure / AMS, required BiPAP, RUL HCAP, Vanc / Cefepime IV 2/24 - 4L [place] (>99%), mild improved mental status (decreased communication), ECHO performed, SLP adv diet dys 3         - BCx (12/31/13) positive for GPC clusters x 1 tube only 2/25 - remains afebrile, dec O2 req to 3L, improved MS (oriented), Na+ 150-->146, Cr 1.48-->1.37  Assessment and Plan: Tammy ShamsLeona C Mehlhoff is a 80 y.o. female presenting with respiratory failure secondary to HCAP. PMH is significant for obesity, HTN, HLP, COPD, Hypothyroidism, Senile Dementia, Tardive Dyskinesia  # Acute Respiratory failure secondary to HCAP, sepsis (possible GPC Bacteremia) and volume overload - Improved New acute respiratory change on admission (12/31/13), likely secondary to new RUL HCAP on repeat CXR. No prior O2 req at SNF. Prior to arrival s/p Rocephin x 1 dose. In ED pt observed to have 4 breath per min upon arrival and was placed in BIPAP. Pt is DNI/DNR. Additionally, possible UTI (trace leuk, WBC 3-6) - remains SDU status, telemetry - improved O2 req, currently on 3L [place] (sat >99%), off BiPAP (2/23) - f/u fever curve (last fever 2/23 to 101.1 @ 1100), afebrile x 48 hours - variable WBC trend 5.1-->3.7-->5.1 - continue BSA empiric antibiotics for HCAP, if afebrile and clinically improved can narrow within 48 hours, pending S/S     - Vancomycin IV (2/23>>)     - Cefepime IV (2/23>>)  - variable WBC trend 5.1-->3.7-->5.1 - f/u blood cultures (collected 2/23 --> positive for GPC clusters x 1 tube only) - pending S/S. Given significant clinical improvement, afebrile, improved WBC, dec O2 req, stable VS, no  indication to repeat BCx - Urine Cx (pending)  # Volume overload No known hx of CHF. Last ECHO was in 2008 (nml EF / no diastolic dysfunction). On admission, clinically volume overloaded, with ProBNP (7743, last 80, 5 months ago). Troponin-I (neg x 3). EKG no signs of ischemia. S/p Lasix IV 80mg  in ED. Wt 213 on admission. - Telemetry - Strict I/O, daily wts - Good response to diuresis, dec wt to 208 lb, UOP (- 2L in 24 hrs), also note improved Cr, persistent b/l ankle pitting edema, b/l lower lung field coarse/crackles - Continue Lasix IV 80mg  BID today, monitor response and titrate - 2D ECHO, performed (01/01/14) - nml EF 55-60%, no diastolic dysfunction, moderate MR, mod pulm HTN  # HTN Borderline low on admission - currently stable, SBP 110-130 / DBP 70-80s - Hold home Amlodipine 10mg  daily  # Afib, chronic - Confirmed AFib on EKG on admission - Stable HR 50-60s, previously 70-80s - No on rate Rate control medication. No on anticoagulation  - will continue to monitor.  # Hx of anemia No signs of acute bleeding  - Hgb 12.5 on admission - stable trend 11.8-->11.4  # AKI in setting of CKD - Improved - GFR on 07/2013 was 34. It was reported to be acute that resolved. Today is 29. Cr is 1.82, baseline seemed to be 1.2. - suspect diuresis effective in setting of possible cardio-renal insult secondary to kidney hypoperfusion. - Monitor Cr, improving trend 1.82-->1.58-->1.48-->1.37 - elevated BUN 39-->40-->40, and BUN:Cr >27, suggestive of pre-renal etiology  #  Hypothyroidism  On levothyroxine : will hold when pt is not able to tolerate PO  # hx of COPD, no significant wheezing on exam. Will treat with PRN duoneb  - received a dose of solumedrol at Nursing home.  # Acute encephalopathy, in setting of chronic dementia, likely infectious etiology 2/2 HCAP - Improved Reportedly at baseline able to communicate, however unable to perform ADLs / ambulate - Significant improvement in  mental status today, patient improved alertness and inc strength with communication - Currently oriented, appropriate, following commands - Continue to hold home psych medications now, given concern for sedation. Consider resuming Seroquel 30mg  daily on discharge  # Dementia, Depression, Tardive dyskinesia  Home meds - Aricept, Cymbalta, Seroquel, and Ativan PRN - Continue to hold, improved mental status today - continue to monitor  # Hypernatremia, mild - Resolved - initial normal Na at 146 --> inc to 150 --> improved 146 - likely secondary to dehydration, cautious with IVF hydration, given overloaded on exam and pending ECHO - DC'd D5-W @ 50cc/hr, x 24 hours, titrate if needed - Improved PO intake  # Poor PO intake - Improved - SLP assessment determined no evidence of aspiration concerns - Continue dysphagia 3 diet, thin liquids - Nutrition following, consider supplements between meals if decreased PO  FEN/GI: NPO/ 1 bolus NS, then KVO - DC'd D5-W - Improved PO intake today, continue Dysphagia 3 diet (per SLP eval)  Prophylaxis: Heparin  Disposition: currently SDU status expect to transfer to regular floor today (01/02/14), pending further work-up / monitoring / clinical improvement, expect to be discharged back to SNF, remain hospitalized 1-2 days - given significant clinical improvement, no expectation for palliative care consult at this time, recommend follow-up with family after acute hospitalization  Subjective: No acute events overnight. Per nursing, patient improved and more alert, down to 3L [place] (from 4L), fed self breakfast this AM. Patient reports feeling "a little bit better", limited verbal responses but with non-verbal yes/no, improved breathing, resolved CP / Abd pain. Good UOP, decreased wt, no other concerns.  Objective: Temp:  [98.5 F (36.9 C)-99.2 F (37.3 C)] 99.1 F (37.3 C) (02/25 1144) Pulse Rate:  [57-102] 57 (02/25 1144) Resp:  [18-25] 18 (02/25  1144) BP: (119-131)/(73-89) 119/89 mmHg (02/25 1144) SpO2:  [95 %-96 %] 96 % (02/25 1144) Weight:  [208 lb 15.9 oz (94.8 kg)] 208 lb 15.9 oz (94.8 kg) (02/25 0501) Physical Exam: Gen: sitting up in bed on 3L O2 [place], improved str to speech, cooperative, NAD HEENT: improved MMM. Neck supple. No JVD apreciated.  CV: irregularly irregular, normal rate, no murmurs rubs or gallops  PULM: Significant improved air movement b/l, persistent but improved coarse rhonchi / bibasilar crackles mid-lower lobes. Limited participation in resp exam. Normal WOB, no tachypnea or retractions. ABD: soft, obese, NTND (improved from generalized mild pain), +active BS  EXT: non-tender, persistent +1 pitting edema in ankles b/l, SCD's in place Neuro - awake, alert, oriented (name, [place], not year), improved verbal communication and non-verbal yes/no, grossly non-focal, muscle strength 5/5 b/l upper, limited participation b/l LE.  Laboratory:  Recent Labs Lab 12/31/13 1207 01/01/14 0854 01/02/14 0300  WBC 3.7* 5.1 6.6  HGB 11.8* 11.4* 10.6*  HCT 39.2 37.6 35.5*  PLT 160 179 186    Recent Labs Lab 12/31/13 0253 12/31/13 1207 01/01/14 0854 01/02/14 0300  NA 146  --  150* 146  K 5.4*  --  4.4 3.7  CL 105  --  103 100  CO2  34*  --  36* 38*  BUN 39*  --  40* 40*  CREATININE 1.82* 1.58* 1.48* 1.37*  CALCIUM 9.6  --  10.1 10.0  PROT 6.9  --   --   --   BILITOT 0.2*  --   --   --   ALKPHOS 66  --   --   --   ALT 14  --   --   --   AST 13  --   --   --   GLUCOSE 139*  --  93 100*   ProBNP 7743 Troponin-I - negative x 3 UA - trace leuk, neg nitrite, mod Hgb, WBC 3-6, rare squam, many bacteria,   Blood culture (collected 2/23 --> positive for GPC clusters x 1 tube only) Urine culture - pending  Imaging/Diagnostic Tests:  2/23 CXR Portable 1v IMPRESSION:  Increasing interstitial prominence, with the right upper lobe and  retrocardiac airspace opacities, this could reflect  bronchopneumonia.   Stable cardiomegaly and fullness of the pulmonary hila which may be  seen with pulmonary arterial hypertension.  Small left pleural effusion with fluid tracking along the right  major fissure versus discoid atelectasis.  2/24 2D ECHO Left ventricle: The cavity size was normal. There was mild concentric hypertrophy. Systolic function was normal. The estimated ejection fraction was in the range of 55% to 60%. Although no diagnostic regional wall motion abnormality was identified, this possibility cannot be completely excluded on the basis of this study. - Mitral valve: Mild thickening, consistent with rheumatic disease. Mobility of the anterior and posterior leaflet was mildly restricted. The process was limited to the leaflet tips, and leaflet body motion was preserved.Diastolic leaflet doming was present. The findings are consistent with mild stenosis. Mean gradient: 7mm Hg (D). Gradients recorded at a mean heart rate of 100 bpm. Valve area by pressure half-time: 2.28cm^2. - Left atrium: The atrium was mildly to moderately dilated. - Tricuspid valve: Moderate regurgitation. - Pulmonary arteries: Systolic pressure was moderately increased. PA peak pressure: 54mm Hg (S).  Saralyn Pilar, DO 01/02/2014, 11:59 AM PGY-1, [facility] Family Medicine FPTS Intern pager: [phone], text pages welcome

## 2014-01-03 DIAGNOSIS — I10 Essential (primary) hypertension: Secondary | ICD-10-CM

## 2014-01-03 DIAGNOSIS — E785 Hyperlipidemia, unspecified: Secondary | ICD-10-CM

## 2014-01-03 LAB — CBC WITH DIFFERENTIAL/PLATELET
BASOS ABS: 0 10*3/uL (ref 0.0–0.1)
Basophils Relative: 0 % (ref 0–1)
Eosinophils Absolute: 0.2 10*3/uL (ref 0.0–0.7)
Eosinophils Relative: 3 % (ref 0–5)
HCT: 39.1 % (ref 36.0–46.0)
HEMOGLOBIN: 12.2 g/dL (ref 12.0–15.0)
Lymphocytes Relative: 33 % (ref 12–46)
Lymphs Abs: 2.2 10*3/uL (ref 0.7–4.0)
MCH: 29.3 pg (ref 26.0–34.0)
MCHC: 31.2 g/dL (ref 30.0–36.0)
MCV: 93.8 fL (ref 78.0–100.0)
MONOS PCT: 12 % (ref 3–12)
Monocytes Absolute: 0.8 10*3/uL (ref 0.1–1.0)
NEUTROS ABS: 3.5 10*3/uL (ref 1.7–7.7)
NEUTROS PCT: 53 % (ref 43–77)
Platelets: 214 10*3/uL (ref 150–400)
RBC: 4.17 MIL/uL (ref 3.87–5.11)
RDW: 16.5 % — ABNORMAL HIGH (ref 11.5–15.5)
WBC: 6.6 10*3/uL (ref 4.0–10.5)

## 2014-01-03 LAB — BASIC METABOLIC PANEL
BUN: 35 mg/dL — AB (ref 6–23)
CO2: 39 mEq/L — ABNORMAL HIGH (ref 19–32)
Calcium: 10.5 mg/dL (ref 8.4–10.5)
Chloride: 96 mEq/L (ref 96–112)
Creatinine, Ser: 1.19 mg/dL — ABNORMAL HIGH (ref 0.50–1.10)
GFR calc non Af Amer: 42 mL/min — ABNORMAL LOW (ref >90)
GFR, EST AFRICAN AMERICAN: 49 mL/min — AB (ref >90)
Glucose, Bld: 82 mg/dL (ref 70–99)
POTASSIUM: 3.3 meq/L — AB (ref 3.7–5.3)
Sodium: 146 mEq/L (ref 137–147)

## 2014-01-03 LAB — CULTURE, BLOOD (ROUTINE X 2)

## 2014-01-03 LAB — VANCOMYCIN, TROUGH: Vancomycin Tr: 26.5 ug/mL (ref 10.0–20.0)

## 2014-01-03 MED ORDER — LEVOFLOXACIN 750 MG PO TABS
750.0000 mg | ORAL_TABLET | ORAL | Status: DC
  Administered 2014-01-03: 750 mg via ORAL
  Filled 2014-01-03 (×2): qty 1

## 2014-01-03 MED ORDER — VANCOMYCIN HCL 10 G IV SOLR
1250.0000 mg | INTRAVENOUS | Status: DC
  Filled 2014-01-03: qty 1250

## 2014-01-03 MED ORDER — LEVOTHYROXINE SODIUM 50 MCG PO TABS
50.0000 ug | ORAL_TABLET | Freq: Every day | ORAL | Status: DC
  Administered 2014-01-04: 50 ug via ORAL
  Filled 2014-01-03 (×2): qty 1

## 2014-01-03 MED ORDER — FUROSEMIDE 40 MG PO TABS
40.0000 mg | ORAL_TABLET | Freq: Every day | ORAL | Status: DC
  Administered 2014-01-03 – 2014-01-04 (×2): 40 mg via ORAL
  Filled 2014-01-03 (×3): qty 1

## 2014-01-03 MED ORDER — POTASSIUM CHLORIDE CRYS ER 20 MEQ PO TBCR
40.0000 meq | EXTENDED_RELEASE_TABLET | Freq: Two times a day (BID) | ORAL | Status: AC
  Administered 2014-01-03 (×2): 40 meq via ORAL
  Filled 2014-01-03 (×2): qty 2

## 2014-01-03 NOTE — Evaluation (Signed)
Physical Therapy Evaluation Patient Details Name: Tammy Salinas MRN: [id number] DOB: Aug 18, 1933 Today's Date: 01/03/2014 Time: [id number] PT Time Calculation (min): 14 min  PT Assessment / Plan / Recommendation History of Present Illness  patient with dementia, chronic pain on narcotics, depression and COPD on chronic O2 via [place] admitted with unresponsive and acute on chronic respiratory failure: secondary to HCAP.  Clinical Impression  Patient was at total assist level of care prior to admission and plans to return to SNF at discharge, which is appropriate level of care.  Do not feel further PT is indicated and will sign off.    PT Assessment  Patent does not need any further PT services    Follow Up Recommendations  SNF    Does the patient have the potential to tolerate intense rehabilitation      Barriers to Discharge        Equipment Recommendations  None recommended by PT    Recommendations for Other Services     Frequency      Precautions / Restrictions Precautions Precautions: None   Pertinent Vitals/Pain No pain indicated      Mobility  Bed Mobility Overal bed mobility: Needs Assistance Bed Mobility: Supine to Sit;Sit to Supine Supine to sit: Total assist Sit to supine: Total assist    Exercises     PT Diagnosis:    PT Problem List:   PT Treatment Interventions:       PT Goals(Current goals can be found in the care plan section) Acute Rehab PT Goals PT Goal Formulation: No goals set, d/c therapy  Visit Information  Last PT Received On: 01/03/14 Assistance Needed: +1 History of Present Illness: patient with dementia, chronic pain on narcotics, depression and COPD on chronic O2 via [place] admitted with unresponsive and acute on chronic respiratory failure: secondary to HCAP.       Prior Functioning  Home Living Family/patient expects to be discharged to:: Skilled nursing facility Prior Function Level of Independence: Needs assistance Gait /  Transfers Assistance Needed: dependent with all ADL's and [facility]. Communication Communication: No difficulties (very quiet voice)    Cognition  Cognition Arousal/Alertness: Lethargic Behavior During Therapy: Flat affect Overall Cognitive Status: No family/caregiver present to determine baseline cognitive functioning    Extremity/Trunk Assessment Upper Extremity Assessment Upper Extremity Assessment: RUE deficits/detail;LUE deficits/detail RUE Deficits / Details: grossly 3/5 for elbow, fair grip, difficulty following commands to fully test; definitely some generalized weakness LUE Deficits / Details: grossly 3/5 for elbow, fair grip, difficulty following commands to fully test; definitely some generalized weakness Lower Extremity Assessment Lower Extremity Assessment: RLE deficits/detail;LLE deficits/detail RLE Deficits / Details: unable to move LE's against gravity; decreased ROM bilateral ankles (unable to reach neutral dorsiflexion) LLE Deficits / Details: unable to move LE's against gravity; decreased ROM bilateral ankles (unable to reach neutral dorsiflexion) Cervical / Trunk Assessment Cervical / Trunk Assessment: Normal   Balance Balance Overall balance assessment: Needs assistance Sitting-balance support: Bilateral upper extremity supported Sitting balance-Leahy Scale: Zero  End of Session PT - End of Session Activity Tolerance: Patient tolerated treatment well Patient left: in bed;with call bell/phone within reach  GP     Olivia CanterMoton, Glynn Freas M, [place]PT [phone] 01/03/2014, 4:13 PM

## 2014-01-03 NOTE — Progress Notes (Signed)
Family Medicine Teaching Service Daily Progress Note Intern Pager: [phone]  Patient name: Tammy Salinas Medical record number: [id number] Date of birth: [birth year] Age: 80 y.o. Gender: female  Primary Care Provider: Gaspar Bidding, DO Consultants: none Code Status: DNR/DNI  Pt Overview and Major Events to Date:  2/23 - admitted, acute resp failure / AMS, required BiPAP, RUL HCAP, Vanc / Cefepime IV 2/24 - 4L [place] (>99%), mild improved mental status (decreased communication), ECHO performed, SLP adv diet dys 3         - BCx (12/31/13) positive for GPC clusters x 1 tube only 2/25 - dec O2 req to 3L, improved MS (oriented), Na+ 150-->146, Cr 1.48-->1.37 2/26 - remains afebrile, continue to titrate O2 down, Cr 1.37 --> 1.19, Lasix IV --> PO  Assessment and Plan: Tammy Salinas is a 80 y.o. female presenting with respiratory failure secondary to HCAP. PMH is significant for obesity, HTN, HLP, COPD, Hypothyroidism, Senile Dementia, Tardive Dyskinesia  # Acute Respiratory failure secondary to HCAP, sepsis (possible GPC Bacteremia) and volume overload - Improved New acute respiratory change on admission (12/31/13), likely secondary to new RUL HCAP on repeat CXR. No prior O2 req at SNF. Prior to arrival s/p Rocephin x 1 dose. In ED pt observed to have 4 breath per min upon arrival and was placed in BIPAP, pt DNI/DNR. Additionally, UA suspicious for UTI, but Urine culture (no growth). - transferred from SDU to regular floor yesterday - Continue to titrate down O2 req, currently on 3L [place] (sat >99%), off BiPAP (2/23) - f/u fever curve (last fever 2/23 to 101.1 @ 1100), afebrile since - stable WBC (6.6) - de-escalate BSA antibiotics and transition to PO (remains afebrile and clinically improved)     - Start Levaquin PO 750mg  q 48 hr (renal dosing) - complete total 14 day course (last dose 01/13/14)     - Continue Vancomycin IV (2/23>>) - empiric MRSA coverage given +BCx, pending species     - DC'd  Cefepime IV (2/23>>2/26)  - f/u blood cultures (collected 2/23 --> positive for GPC clusters x 1 tube only) - pending S/S. Strongly suspect it is a contaminant, as clinically improving  # Volume overload, no evidence of CHF - Improved No known hx of CHF. Last ECHO was in 2008 (nml EF / no diastolic dysfunction). On admission, clinically volume overloaded, with ProBNP (7743, last 80, 5 months ago). Troponin-I (neg x 3). EKG no signs of ischemia. S/p Lasix IV 80mg  in ED. Wt 213 on admission. - 2D ECHO, performed (01/01/14) - nml EF 55-60%, no diastolic dysfunction, moderate MR, mod pulm HTN - Telemetry - Strict I/O, daily wts - Good response to diuresis, dec wt to 208 lb, UOP (- 3.4 L in 24 hrs), improved Cr to 1.19, improved b/l ankle edema, reduced crackles b/l crackles. Appears euvolemic / dry on exam - Transition to Lasix PO 40mg  daily today (DC'd Lasix IV 80mg  BID)  # HTN Borderline low on admission - currently stable, SBP 110-130 / DBP 70-80s - Hold home Amlodipine 10mg  daily  # Afib, chronic - Confirmed AFib on EKG on admission - Stable HR 50-60s, previously 70-80s - No on rate Rate control medication. No on anticoagulation  - will continue to monitor.  # Hx of anemia No signs of acute bleeding  - Hgb 12.5 on admission - stable trend 11.8-->11.4  # AKI in setting of CKD - Improved - GFR on 07/2013 was 34. It was reported to be acute that  resolved. Today is 29. Cr is 1.82, baseline seemed to be 1.2. - suspect diuresis effective in setting of possible cardio-renal insult secondary to kidney hypoperfusion. - Monitor Cr, improving trend 1.82-->1.58-->1.48-->1.37-->1.19 - elevated BUN 39-->40-->40, and BUN:Cr >27, suggestive of pre-renal etiology  # Hypothyroidism  On levothyroxine : will hold when pt is not able to tolerate PO  # hx of COPD, no significant wheezing on exam. Will treat with PRN duoneb  - received a dose of solumedrol at Nursing home.  # Acute encephalopathy,  in setting of chronic dementia, likely infectious etiology 2/2 HCAP - Improved Reportedly at baseline able to communicate, however unable to perform ADLs / ambulate - Stable improved mental status today, patient improved alertness and inc strength with communication - Currently oriented, appropriate, following commands - Continue to hold home psych medications now, given concern for sedation. Consider resuming Seroquel 30mg  daily on discharge  # Dementia, Depression, Tardive dyskinesia  Home meds - Aricept, Cymbalta, Seroquel, and Ativan PRN - Continue to hold, improved mental status today - continue to monitor  # Hypernatremia, mild - Resolved - initial normal Na at 146 --> inc to 150 --> improved 146 - likely secondary to dehydration, cautious with IVF hydration, given overloaded on exam and pending ECHO - DC'd D5-W @ 50cc/hr - Improved PO intake  # Poor PO intake - Improved - SLP assessment determined no evidence of aspiration concerns - Continue dysphagia 3 diet, thin liquids - Nutrition following, consider supplements between meals if decreased PO  # Hypokalemia, mild - mild K decrease 3.7 --> 3.3 - replete with K BID  FEN/GI: KVO - continue Dysphagia 3 diet (per SLP eval)  Prophylaxis: Heparin  Disposition: currently SDU status expect to transfer to regular floor today (01/02/14), pending further work-up / monitoring / clinical improvement, expect to be discharged back to SNF, remain hospitalized 1-2 more days pending bed status, expect to be medically stable for discharge tomorrow. - given significant clinical improvement, no expectation for palliative care consult at this time, recommend follow-up with family after acute hospitalization - CSW working on placement (potential goal for 01/04/14) - Interested in [facility], initially came from [place] and agreeable to returning if no availability at [facility]. Pending further updates / bed availability / transfer status  d/t weather  Subjective: No acute events overnight. This morning endorses no new complaints, breathing improved. Denies any pain. Tolerating regular diet. Good UOP.  Objective: Temp:  [98.7 F (37.1 C)-99.3 F (37.4 C)] 99 F (37.2 C) (02/26 0453) Pulse Rate:  [47-86] 47 (02/26 0453) Resp:  [18-23] 18 (02/26 0453) BP: (110-131)/(68-89) 117/71 mmHg (02/26 0453) SpO2:  [95 %-100 %] 99 % (02/26 0453) Weight:  [208 lb 1.6 oz (94.394 kg)-215 lb 3.2 oz (97.614 kg)] 208 lb 1.6 oz (94.394 kg) (02/26 0453) Physical Exam: Gen: sleeping, wakes up easily, 3L [place], pleasant, improved strength with speech, NAD HEENT: mild dry MM. Neck supple. No JVD apreciated.  CV: irregularly irregular, normal rate, no murmurs rubs or gallops  PULM: Continued improved air movement b/l with reduced coarse rhonchi / bibasilar crackles mid-lower lobes. Limited participation in resp exam. Normal WOB, no tachypnea or retractions. ABD: soft, obese, NTND, +active BS  EXT: non-tender, improved trace pitting edema in ankles b/l, SCD's in place Neuro - awake, alert, oriented (name, [place]), improved verbal communication and non-verbal yes/no, grossly non-focal, muscle strength 5/5 b/l upper, limited participation b/l LE.  Laboratory:  Recent Labs Lab 01/01/14 [id number] 01/02/14 0300 01/03/14 0327  WBC 5.1 6.6 6.6  HGB 11.4* 10.6* 12.2  HCT 37.6 35.5* 39.1  PLT 179 186 214    Recent Labs Lab 12/31/13 0253  01/01/14 0854 01/02/14 0300 01/03/14 0327  NA 146  --  150* 146 146  K 5.4*  --  4.4 3.7 3.3*  CL 105  --  103 100 96  CO2 34*  --  36* 38* 39*  BUN 39*  --  40* 40* 35*  CREATININE 1.82*  < > 1.48* 1.37* 1.19*  CALCIUM 9.6  --  10.1 10.0 10.5  PROT 6.9  --   --   --   --   BILITOT 0.2*  --   --   --   --   ALKPHOS 66  --   --   --   --   ALT 14  --   --   --   --   AST 13  --   --   --   --   GLUCOSE 139*  --  93 100* 82  < > = values in this interval not displayed. ProBNP 7743 Troponin-I -  negative x 3 UA - trace leuk, neg nitrite, mod Hgb, WBC 3-6, rare squam, many bacteria,   Blood culture (collected 2/23 --> positive for GPC clusters x 1 tube only) Urine culture - No growth  Imaging/Diagnostic Tests:  2/23 CXR Portable 1v IMPRESSION:  Increasing interstitial prominence, with the right upper lobe and  retrocardiac airspace opacities, this could reflect  bronchopneumonia.  Stable cardiomegaly and fullness of the pulmonary hila which may be  seen with pulmonary arterial hypertension.  Small left pleural effusion with fluid tracking along the right  major fissure versus discoid atelectasis.  2/24 2D ECHO Left ventricle: The cavity size was normal. There was mild concentric hypertrophy. Systolic function was normal. The estimated ejection fraction was in the range of 55% to 60%. Although no diagnostic regional wall motion abnormality was identified, this possibility cannot be completely excluded on the basis of this study. - Mitral valve: Mild thickening, consistent with rheumatic disease. Mobility of the anterior and posterior leaflet was mildly restricted. The process was limited to the leaflet tips, and leaflet body motion was preserved.Diastolic leaflet doming was present. The findings are consistent with mild stenosis. Mean gradient: 7mm Hg (D). Gradients recorded at a mean heart rate of 100 bpm. Valve area by pressure half-time: 2.28cm^2. - Left atrium: The atrium was mildly to moderately dilated. - Tricuspid valve: Moderate regurgitation. - Pulmonary arteries: Systolic pressure was moderately increased. PA peak pressure: 54mm Hg (S).  Tammy PilarAlexander Terrika Zuver, DO 01/03/2014, 7:10 AM PGY-1, [facility] FPTS Intern pager: [phone], text pages welcome

## 2014-01-03 NOTE — Progress Notes (Signed)
FMTS Attending Note  I personally saw and evaluated the patient. The plan of care was discussed with the resident team. I agree with the assessment and plan as documented by the resident.   Agree with transition to Levaquin PO. Discontinue Cefepime today. Discontinue Vancomycin once Blood culture is finalized. Suspect positive blood culture is due to contamination. Wean oxygen as tolerated. [facility].   Donnella ShamKyle Tanis Hensarling MD

## 2014-01-04 ENCOUNTER — Non-Acute Institutional Stay (INDEPENDENT_AMBULATORY_CARE_PROVIDER_SITE_OTHER): Payer: Medicare Other | Admitting: Sports Medicine

## 2014-01-04 DIAGNOSIS — F323 Major depressive disorder, single episode, severe with psychotic features: Secondary | ICD-10-CM

## 2014-01-04 DIAGNOSIS — Z593 Problems related to living in residential institution: Secondary | ICD-10-CM

## 2014-01-04 DIAGNOSIS — Z789 Other specified health status: Secondary | ICD-10-CM

## 2014-01-04 DIAGNOSIS — G4700 Insomnia, unspecified: Secondary | ICD-10-CM

## 2014-01-04 DIAGNOSIS — J189 Pneumonia, unspecified organism: Secondary | ICD-10-CM

## 2014-01-04 DIAGNOSIS — E039 Hypothyroidism, unspecified: Secondary | ICD-10-CM

## 2014-01-04 DIAGNOSIS — I10 Essential (primary) hypertension: Secondary | ICD-10-CM

## 2014-01-04 DIAGNOSIS — G2401 Drug induced subacute dyskinesia: Secondary | ICD-10-CM

## 2014-01-04 DIAGNOSIS — F0390 Unspecified dementia without behavioral disturbance: Secondary | ICD-10-CM

## 2014-01-04 DIAGNOSIS — F32A Depression, unspecified: Secondary | ICD-10-CM

## 2014-01-04 DIAGNOSIS — I4891 Unspecified atrial fibrillation: Secondary | ICD-10-CM

## 2014-01-04 LAB — CBC WITH DIFFERENTIAL/PLATELET
Basophils Absolute: 0 10*3/uL (ref 0.0–0.1)
Basophils Relative: 0 % (ref 0–1)
Eosinophils Absolute: 0.2 10*3/uL (ref 0.0–0.7)
Eosinophils Relative: 3 % (ref 0–5)
HCT: 38.4 % (ref 36.0–46.0)
HEMOGLOBIN: 11.7 g/dL — AB (ref 12.0–15.0)
LYMPHS ABS: 2 10*3/uL (ref 0.7–4.0)
LYMPHS PCT: 30 % (ref 12–46)
MCH: 28.8 pg (ref 26.0–34.0)
MCHC: 30.5 g/dL (ref 30.0–36.0)
MCV: 94.6 fL (ref 78.0–100.0)
Monocytes Absolute: 0.9 10*3/uL (ref 0.1–1.0)
Monocytes Relative: 13 % — ABNORMAL HIGH (ref 3–12)
NEUTROS ABS: 3.5 10*3/uL (ref 1.7–7.7)
Neutrophils Relative %: 54 % (ref 43–77)
Platelets: 214 10*3/uL (ref 150–400)
RBC: 4.06 MIL/uL (ref 3.87–5.11)
RDW: 16.7 % — ABNORMAL HIGH (ref 11.5–15.5)
WBC: 6.6 10*3/uL (ref 4.0–10.5)

## 2014-01-04 LAB — BASIC METABOLIC PANEL
BUN: 31 mg/dL — ABNORMAL HIGH (ref 6–23)
CO2: 39 mEq/L — ABNORMAL HIGH (ref 19–32)
Calcium: 10.5 mg/dL (ref 8.4–10.5)
Chloride: 100 mEq/L (ref 96–112)
Creatinine, Ser: 1.3 mg/dL — ABNORMAL HIGH (ref 0.50–1.10)
GFR, EST AFRICAN AMERICAN: 44 mL/min — AB (ref >90)
GFR, EST NON AFRICAN AMERICAN: 38 mL/min — AB (ref >90)
GLUCOSE: 96 mg/dL (ref 70–99)
POTASSIUM: 4.2 meq/L (ref 3.7–5.3)
SODIUM: 149 meq/L — AB (ref 137–147)

## 2014-01-04 MED ORDER — LEVOFLOXACIN 750 MG PO TABS: 750.0000 mg | ORAL_TABLET | ORAL | Status: AC

## 2014-01-04 NOTE — Assessment & Plan Note (Addendum)
Chart review reveals essentially stable versus oversedated without mention of mania or aggressive behaviors.  Medications have been discontinued during admission to the hospital and she has had marked improvement being off of Seroquel, Cymbalta, Aricept, Ativan and gabapentin.  Continue trazodone 50 mg by mouth each bedtime.  Consider restarting low dose Seroquel 25 mg each bedtime either in addition to or as primary sleep a  Any and all medication changes the family would like to be involved with and this will be addressed going forward.

## 2014-01-04 NOTE — Assessment & Plan Note (Addendum)
Recently well controlled without medications.  Lasix added to her medication regimen.  Will need to be readdressed if needing chronically.

## 2014-01-04 NOTE — Discharge Summary (Signed)
FMTS ATTENDING  NOTE Laurent Cargile,MD I  have seen and examined this patient, reviewed their chart. I have discussed this patient with the resident. I agree with the resident's findings, assessment and care plan. 

## 2014-01-04 NOTE — Progress Notes (Signed)
Pt transferred to 3W. CSW called unit CSW and left voicemail providing handoff.    Maryclare LabradorJulie Nayleah Gamel, MSW, [facility] CenterCSWA Clinical Social Worker [phone]

## 2014-01-04 NOTE — Progress Notes (Signed)
[facility] Patient ADMISSION H&P KRISTYANA NOTTE 80 y.o. female  MRN: [id number]  DOB: 1933-10-25   PCP: Andrena Mews, DO  Active care team: Psych Code Status: DNR  HPI & ROS: JAZZLYN HUIZENGA is a 80 y.o. year old female readmitted to [facility] on 01/04/14 following hospitalization for acute respiratory failure.  She was found to have a healthcare associated pneumonia by an outside provider and started on Rocephin and IV fluids but continued to decline and at time of admission and was requiring BiPAP.  Her pro BNP was 7743 and she was aggressively diuresed.  She was initially started on empiric antibiotics and was narrowed to Levaquin monotherapy for a total of 14 day coverage.  She was gradually able to be weaned down to 1 L nasal cannula.  Prior to her respiratory decline Seroquel had been increased and family has concerns this may worsen her mental status resulting in a pneumonia.  SLP evaluation during hospitalization demonstrated no evidence of aspiration.   Her PMHx is significant for dementia, depression with psychotic features, hypothyroidism, hypertension, CKD.  She had previously been steadily improving prior to this and was much more conversant the week prior to admission.  She is quadriparetic at baseline to 2 cervical and lumbar spinal stenosis and is completely dependent on all activities of daily living.  She is recently diagnosed with atrial fibrillation and in discussion with the family deferring anticoagulation is felt to be appropriate.  She did have A. fib with controlled rate at the time of discharge.  For further subjective including (HPI, Interval History & ROS) please see problem based charting  HISTORY: Past Medical History  Diagnosis Date  . Schizophrenia     Rx Seroquel, Aricept, Cymbalta. MMSE 22/30.  Marland Kitchen Dementia   . Hyperlipidemia   . Depression   . GERD (gastroesophageal reflux disease)   . Chronic kidney disease (CKD)     Creatinine 1.15 (01/21/11).  .  Hypothyroid   . Chronic pain     Dx Gout, Back Pain, Hx PMR - Continues to c/o pain chronically. On Tylenol, Gabapentin, and Oxycontin.  Weaned from prednisone with no change in function. Now in OT 3x/week. NOTE: PATIENT HAS CHRONIC RIGHT HAND PAIN.  Marland Kitchen Hemorrhoids     Rx Miralax, Tucks, Hydrocortisone.   . Cystitis, chronic     Pt with Hx multiple UTIs. Followed by [facility]. Do not get UA unless systemic symptoms.  . Femur fracture, left     Pt with fall 4/409 with L femur frx; s/p ORIF 02/14/08 by Dr Shon Baton.  No longer on coumadin. Repeat xray of leg per ortho showed separation at repair site. Pt no longer ambulatory. Rx ASA, Calcium, Vitamin D, Bisphosphonate.  . Hypothyroid     Rx Synthroid.  Marland Kitchen Hypertension     Rx Norvasc.  Marland Kitchen Dysphagia   . Fracture, humerus, proximal 08/2009    Non-union  . RESTLESS LEG SYNDROME 05/26/2007  . HIP FRACTURE, LEFT 02/29/2008    Qualifier: Diagnosis of  By: Corliss Marcus MD, MAKEECHA    . PRESSURE ULCER OTHER SITE 06/10/2010    Qualifier: Diagnosis of  By: Sheffield Slider MD, Deniece Portela    . GOUT 05/26/2007  . Hyperkalemia 07/19/2013  . Pain in joint, ankle and foot 02/07/2013  . Polymyalgia rheumatica 05/26/2007  . SPONDYLOSIS, LUMBAR W/MYELOPATHY 05/26/2007  . SPONDYLOSIS, CERVICAL W/MYELOPATHY 05/26/2007    Quadriparetic due to this and lumbar stenosis   . WRIST PAIN, RIGHT 10/08/2009  . DEGENERATIVE  JOINT DISEASE, BOTH KNEES, SEVERE 05/26/2007  . HEMORRHOIDS 05/05/2010    Qualifier: Diagnosis of  By: Quincy Carnes    . URINARY TRACT INFECTION, RECURRENT 05/31/2008   Past Surgical History  Procedure Laterality Date  . Total abdominal hysterectomy      left  . Orif hip fracture  2009    leftf  . Humeral arthroplasty  08/15/09    left  . Posterior laminectomy / decompression lumbar spine      L 1-2  . Umbilical hernia repair     Social Hx: History   Social History  . Marital Status: Legally Separated    Spouse Name: N/A    Number of Children: N/A  .  Years of Education: N/A   Social History Main Topics  . Smoking status: Never Smoker   . Smokeless tobacco: Never Used  . Alcohol Use: No  . Drug Use: No  . Sexual Activity: No   Other Topics Concern  . Not on file   Social History Narrative   Attentive son, Marinell Blight [phone], who visits her frequently   No family history on file. Allergies  Allergen Reactions  . Hydrochlorothiazide     REACTION: elevated calcium    OBJECTIVE: HR: 92 bpm  BP: 125/88 mmHg  TEMP: 98.8 F (37.1 C) ( )  RESP: 100 % (on 1L)  HT:    WT:    BMI:     Wt Readings from Last 3 Encounters:  01/04/14 201 lb 9.6 oz (91.445 kg)  09/10/13 219 lb 3.2 oz (99.428 kg)  02/10/13 217 lb (98.431 kg)      Physical Exam:  GENERAL:  elderly obese African American female. In no discomfort; no respiratory distress  PSYCH: alert and  Interactive.oriented to place and person.  Not situation or time   HNEENT:  no JVD   CARDIO:  irregularly irregular with regular rate, no murmur appreciated but distant heart sounds   LUNGS:  overall poor inspiratory and expiratory effort no focal consolidation   ABDOMEN:   EXTREM:   generally poor mobility.  Unable to actively follow instructions.  No contractures.  No swelling, distal pulses 1+ out of 4   GU:   SKIN:    EKG:  2/23 - A. fib   LABS:  Basic Labs Extended:   Recent Labs Lab 01/02/14 0300 01/03/14 0327 01/04/14 0328  WBC 6.6 6.6 6.6  HGB 10.6* 12.2 11.7*  HCT 35.5* 39.1 38.4  PLT 186 214 214     Recent Labs Lab 01/02/14 0300 01/03/14 0327 01/04/14 0328  NA 146 146 149*  K 3.7 3.3* 4.2  CL 100 96 100  CO2 38* 39* 39*  BUN 40* 35* 31*  CREATININE 1.37* 1.19* 1.30*  GLUCOSE 100* 82 96  CALCIUM 10.0 10.5 10.5    No results found for this basename: ALBUMIN, PROTEIN, ALT, AST, ALKPHOS, BILITOT, MG, LIPASE,  in the last 168 hours  Recent Labs  05/22/13 12/09/13  HGBA1C  --  5.6  TRIG 142 116  CHOL 161 174  HDL 42 54  LDLCALC 91  97  TSH  --  1.49       Recent Labs Lab 12/31/13 1207 12/31/13 1646 12/31/13 2320  TROPONINI <0.30 <0.30 <0.30     IMAGING: 2/24 2D ECHO: EF 55-60%, rheumatic mitral valve changes with mild stenosis.  PA peak pressure 54 2/23 CXR: Likely right upper lobe and retrocardiac bronchopneumonia.  Cardiomegaly.  Small left pleural effusion  CURRENT  MEDICATIONS: Previous Medications   ACETAMINOPHEN (TYLENOL) 500 MG TABLET    Take 2 tablets (1,000 mg total) by mouth 3 (three) times daily.   ALBUTEROL (PROVENTIL) (2.5 MG/3ML) 0.083% NEBULIZER SOLUTION    Take 2.5 mg by nebulization every 4 (four) hours as needed for shortness of breath.   ALLOPURINOL (ZYLOPRIM) 300 MG TABLET    Take 1 tablet (300 mg total) by mouth daily.   ARFORMOTEROL (BROVANA) 15 MCG/2ML NEBU    Take 2 mLs (15 mcg total) by nebulization 2 (two) times daily.   ASPIRIN EC 325 MG TABLET    Take 325 mg by mouth daily.   ATORVASTATIN (LIPITOR) 10 MG TABLET    Take 1 tablet (10 mg total) by mouth at bedtime.   BISACODYL (DULCOLAX) 10 MG SUPPOSITORY    Place 10 mg rectally daily as needed for moderate constipation.   BUDESONIDE (PULMICORT) 0.25 MG/2ML NEBULIZER SOLUTION    Take 0.25 mg by nebulization 2 (two) times daily. prior to brovana   CHOLECALCIFEROL 50000 UNITS CAPSULE    Take 50,000 Units by mouth every 30 (thirty) days. Given on 14th of month.   COLCHICINE 0.6 MG TABLET    Take 1 tablet (0.6 mg total) by mouth daily.   CYCLOSPORINE (RESTASIS) 0.05 % OPHTHALMIC EMULSION    Place 1 drop into both eyes 2 (two) times daily.    GUAIFENESIN (ROBITUSSIN) 100 MG/5ML LIQUID    Take 400 mg by mouth every 6 (six) hours as needed for cough.   LEVOFLOXACIN (LEVAQUIN) 750 MG TABLET    Take 1 tablet (750 mg total) by mouth every other day.   LEVOTHYROXINE (SYNTHROID, LEVOTHROID) 50 MCG TABLET    Take 1 tablet (50 mcg total) by mouth daily.   LORATADINE (CLARITIN) 10 MG TABLET    Take 10 mg by mouth daily as needed.    MULTIPLE VITAMIN  (MULTIVITAMIN) TABLET    Take 1 tablet by mouth daily.    OMEPRAZOLE (PRILOSEC) 20 MG CAPSULE    Take 40 mg by mouth daily.    POLYETHYLENE GLYCOL (GLYCOLAX) PACKET    Take 17 g by mouth every other day. Mix into 8 ounces fluid for constipation   PROPYLENE GLYCOL (SYSTANE BALANCE) 0.6 % SOLN    Place 1 drop into both eyes 2 (two) times daily.    Modified Medications   Modified Medication Previous Medication   TRAZODONE (DESYREL) 50 MG TABLET traZODone (DESYREL) 50 MG tablet      Take 1 tablet (50 mg total) by mouth at bedtime as needed for sleep.    Take 25-50 mg by mouth at bedtime as needed for sleep. Take 1/2 tablet (25 mg) by mouth if needed for sleep, then 50mg  1 hour later if ineffective.   New Prescriptions   FUROSEMIDE (LASIX) 20 MG TABLET    Take 1 tablet (20 mg total) by mouth daily.   Discontinued Medications   GUAIFENESIN (MUCINEX FOR KIDS PO)    Take 500 mg by mouth 2 (two) times daily. Kids Mini-melts- Give 1 pack BID.  No orders of the defined types were placed in this encounter.    ASSESSMENT & PLAN:  See problem based charting & AVS for pt instructions.  Andrena MewsMichael D Rigby, DO [facility] Resident - PGY- 3 01/07/2014 9:48 AM

## 2014-01-04 NOTE — Progress Notes (Signed)
FMTS ATTENDING  NOTE Tammy Wooley,MD I  have seen and examined this patient, reviewed their chart. I have discussed this patient with the resident. I agree with the resident's findings, assessment and care plan. 

## 2014-01-04 NOTE — Assessment & Plan Note (Signed)
Avoid antipsychotics given tardive dyskinesia and questionable need given stability and oversedation and advancing dementia.

## 2014-01-04 NOTE — Progress Notes (Signed)
Family Medicine Teaching Service Daily Progress Note Intern Pager: [phone]  Patient name: Tammy Salinas Medical record number: [id number] Date of birth: 11-Oct-1933 Age: 80 y.o. Gender: female  Primary Care Provider: Gaspar Bidding, DO Consultants: none Code Status: DNR/DNI  Pt Overview and Major Events to Date:  2/23 - admitted, acute resp failure / AMS, required BiPAP, RUL HCAP, Vanc / Cefepime IV 2/24 - 4L [employer] (>99%), mild improved mental status (decreased communication), ECHO performed, SLP adv diet dys 3         - BCx (12/31/13) positive for GPC clusters x 1 tube only 2/25 - dec O2 req to 3L, improved MS (oriented), Na+ 150-->146, Cr 1.48-->1.37 2/26 - remains afebrile, continue to titrate O2 down, Cr 1.37 --> 1.19, Lasix IV --> PO, BCx +contaminate, DC'd Vanc 2/27 - dec O2 req to 1L, stable SCr / WBC, wt 201 lb, expect discharge to SNF today  Assessment and Plan: Tammy Salinas is a 80 y.o. female presenting with respiratory failure secondary to HCAP. PMH is significant for obesity, HTN, HLP, COPD, Hypothyroidism, Senile Dementia, Tardive Dyskinesia  # Acute Respiratory failure secondary to HCAP - Improved New acute respiratory change on admission (12/31/13), likely secondary to new RUL HCAP on repeat CXR. No prior O2 req at SNF. Prior to arrival s/p Rocephin x 1 dose. In ED pt observed to have 4 breath per min upon arrival and was placed in BIPAP, pt DNI/DNR. Additionally, Urine culture (no growth). Off BiPAP (2/23). Out of SDU (2/25) - Expect to titrate to RA, currently down to 1L  (sat >99%) yesterday 3L, continue to titrate down O2 req - remains afebrile, (last fever 2/23 to 101.1 @ 1100) - stable WBC (6.6) - Continue Levaquin PO 750mg  q 48 hr (renal dosing) - on discharge complete total 14 day course (last dose 01/13/14) - DC'd Vancomycin IV (2/23>>2/26) - x1 BCx +coag neg staph species contaminate (other BCx 2/23 --> No growth) - DC'd Cefepime IV (2/23>>2/26)  # Volume  overload, no evidence of CHF - Improved No known hx of CHF. Last ECHO was in 2008 (nml EF / no diastolic dysfunction). On admission, clinically volume overloaded, with ProBNP (7743, last 80, 5 months ago). Troponin-I (neg x 3). EKG no signs of ischemia. S/p Lasix IV 80mg  in ED. Wt 213 on admission. - 2D ECHO, performed (01/01/14) - nml EF 55-60%, no diastolic dysfunction, moderate MR, mod pulm HTN - Telemetry, strict I/O, daily wts - Good response to diuresis, dec wt to 201 lb, UOP (- 6.5 L since admit), stable SCr, improved b/l ankle edema, reduced crackles b/l crackles. Appears euvolemic / dry on exam - Continue Lasix PO 40mg  daily, expect to discharge on PRN  # HTN Borderline low on admission - currently stable, SBP 110-130 / DBP 70-80s - Hold home Amlodipine 10mg  daily  # Afib, chronic - Confirmed AFib on EKG on admission - Stable HR 50-60s, previously 70-80s - No on rate Rate control medication. No on anticoagulation  - will continue to monitor.  # Hx of anemia No signs of acute bleeding  - Hgb 12.5 on admission - stable trend 11.8-->11.4  # AKI in setting of CKD - Improved - Admission elevated SCr 1.82 / reduced GFR / BUN:Cr >27, suggestive of pre-renal etiology. Baseline seemed to be 1.2. - suspect diuresis effective in setting of possible cardio-renal insult secondary to kidney hypoperfusion. - Stable improved SCr trend 1.82-->1.58-->1.48-->1.37-->1.19-->1.30 - improved BUN 40>>>31, and BUN:Cr >27, suggestive of pre-renal etiology  #  Hypothyroidism  - Continue home levothyroxine 50mcg daily  # hx of COPD no significant wheezing on exam. Will treat with PRN duoneb  - received a dose of solumedrol at Nursing home.  # Acute encephalopathy, in setting of chronic dementia, likely infectious etiology 2/2 HCAP - Resolved Reportedly at baseline able to communicate, however unable to perform ADLs / ambulate - Stable improved mental status today, patient improved alertness and inc  strength with communication - Currently oriented, appropriate, following commands - Continue to hold home psych medications now, given concern for sedation. Consider resuming Seroquel 30mg  daily on discharge  # Dementia, Depression, Tardive dyskinesia  Home meds - Aricept, Cymbalta, Seroquel, and Ativan PRN - Continue to hold, improved mental status today - continue to monitor  # Hypernatremia, mild - Improved - Variable Na trend 146-->150-->146-->146-->149 - likely secondary to dehydration, cautious with IVF hydration, given overloaded on exam and pending ECHO - DC'd D5-W @ 50cc/hr - Improved PO intake  # Poor PO intake - Improved - SLP assessment determined no evidence of aspiration concerns - Continue dysphagia 3 diet, thin liquids - Nutrition following, consider supplements between meals if decreased PO  # Hypokalemia, mild - Resolved - mild K decrease 3.7 --> 3.3-->4.4 - received K 40mEq BID   FEN/GI: KVO - continue Dysphagia 3 diet (per SLP eval) - Remove Foley cath on discharge  Prophylaxis: Heparin  Disposition: Currently regular floor status (transferred out of SDU 2/25), significant clinically improved, expect to be discharged back to SNF today, pending bed status, medically stable for discharge - given significant clinical improvement, no expectation for palliative care consult at this time, recommend follow-up with family after acute hospitalization - CSW working on placement (potential goal for 01/04/14) - [facility] and [facility]. Pending further updates / bed availability / transfer status d/t weather  Subjective: No acute events overnight. Per nursing, titrated O2 down to 1L (from 3L) since yesterday. This morning patient is feeling good. No new complaints. Able to feed self and tolerating diet. Good UOP with foley still in place. Ready to leave hospital and go to facility  today.  Denies any fever/chills, CP, SOB, abd pain.  Objective: Temp:  [98.6 F (37 C)-98.9 F (37.2 C)] 98.9 F (37.2 C) (02/27 0543) Pulse Rate:  [55-93] 93 (02/27 0543) Resp:  [18] 18 (02/27 0543) BP: (111-134)/(72-89) 125/89 mmHg (02/27 0543) SpO2:  [95 %-100 %] 99 % (02/27 0543) Weight:  [201 lb 9.6 oz (91.445 kg)] 201 lb 9.6 oz (91.445 kg) (02/27 0543) Physical Exam: Gen: sitting up in bed about to eat breakfast, 1L [place], pleasant, improved strength with speech, NAD HEENT: MMM CV: irregularly irregular, normal rate, no murmurs PULM: Mostly CTAB without significant wheezing, crackles, or rhonchi. Improved air movement b/l. Normal WOB ABD: soft, obese, NTND, +active BS  EXT: non-tender, improved trace pitting edema in ankles b/l, SCD's in place Neuro - awake, alert, oriented (name, place, month, not day / year), dramatic improvement in strength of verbal communication, grossly non-focal  Laboratory:  Recent Labs Lab 01/02/14 0300 01/03/14 0327 01/04/14 0328  WBC 6.6 6.6 6.6  HGB 10.6* 12.2 11.7*  HCT 35.5* 39.1 38.4  PLT 186 214 214    Recent Labs Lab 12/31/13 0253  01/02/14 0300 01/03/14 0327 01/04/14 0328  NA 146  < > 146 146 149*  K 5.4*  < > 3.7 3.3* 4.2  CL 105  < >  100 96 100  CO2 34*  < > 38* 39* 39*  BUN 39*  < > 40* 35* 31*  CREATININE 1.82*  < > 1.37* 1.19* 1.30*  CALCIUM 9.6  < > 10.0 10.5 10.5  PROT 6.9  --   --   --   --   BILITOT 0.2*  --   --   --   --   ALKPHOS 66  --   --   --   --   ALT 14  --   --   --   --   AST 13  --   --   --   --   GLUCOSE 139*  < > 100* 82 96  < > = values in this interval not displayed. ProBNP 7743 Troponin-I - negative x 3 UA - trace leuk, neg nitrite, mod Hgb, WBC 3-6, rare squam, many bacteria,   Blood culture (collected 2/23 -->- x1 +coag neg staph species contaminate (other tube = No growth) Urine culture - No growth  Imaging/Diagnostic Tests:  2/23 CXR Portable 1v IMPRESSION:  Increasing interstitial  prominence, with the right upper lobe and  retrocardiac airspace opacities, this could reflect  bronchopneumonia.  Stable cardiomegaly and fullness of the pulmonary hila which may be  seen with pulmonary arterial hypertension.  Small left pleural effusion with fluid tracking along the right  major fissure versus discoid atelectasis.  2/24 2D ECHO Left ventricle: The cavity size was normal. There was mild concentric hypertrophy. Systolic function was normal. The estimated ejection fraction was in the range of 55% to 60%. Although no diagnostic regional wall motion abnormality was identified, this possibility cannot be completely excluded on the basis of this study. - Mitral valve: Mild thickening, consistent with rheumatic disease. Mobility of the anterior and posterior leaflet was mildly restricted. The process was limited to the leaflet tips, and leaflet body motion was preserved.Diastolic leaflet doming was present. The findings are consistent with mild stenosis. Mean gradient: 7mm Hg (D). Gradients recorded at a mean heart rate of 100 bpm. Valve area by pressure half-time: 2.28cm^2. - Left atrium: The atrium was mildly to moderately dilated. - Tricuspid valve: Moderate regurgitation. - Pulmonary arteries: Systolic pressure was moderately increased. PA peak pressure: 54mm Hg (S).  Saralyn Pilar, DO 01/04/2014, 9:53 AM PGY-1, [facility] FPTS Intern pager: [phone], text pages welcome

## 2014-01-04 NOTE — Assessment & Plan Note (Signed)
Chronic intermittent nearly diagnosed.  Rate well controlled Continue to defer anticoagulation.  Will need to continue to readdress with family members.

## 2014-01-04 NOTE — Assessment & Plan Note (Signed)
14 day course of Levaquin.  Q. right eye dosing. Continue Brovona and Pulmicort.  Albuterol as needed

## 2014-01-04 NOTE — Assessment & Plan Note (Signed)
Readmitted to [facility].  Attending Dr. Randolm IdolFletke

## 2014-01-04 NOTE — Assessment & Plan Note (Signed)
Acute encephalopathy in the setting of chronic dementia.  Chronic medications have been withdrawn and she has had a significant improvement in her overall status since this has happened.   Aricept has been stopped during hospitalization and will not be continued at this time

## 2014-01-04 NOTE — Assessment & Plan Note (Addendum)
Most recent TSH 1.49 on 12/09/2013.  Continue Synthroid at 50 mcg.

## 2014-01-04 NOTE — Discharge Instructions (Signed)
You were hospitalized due to acute respiratory failure, requiring BiPAP mask intervention and admission to Step Down Unit (for close monitoring). We believe that this episode was caused by a Pneumonia. Additionally, your mental status was initially significantly altered, which can be caused by the pneumonia infection or also shallow breathing with an increased Carbon Dioxide level. Treatments with BiPAP and Oxygen therapy, IV antibiotics, and diuretic medicines (to remove excess fluid from lungs) were successful.  You are prescribed a continued course of Levaquin antibiotic to take for the pneumonia.  Regarding the multiple Psychiatric medications (Seroquel, Cymbalta, Aricept, Ativan) we had intentionally held these medicines during the hospital stay, because of concerns with over sedation. Please discuss these further with your primary care doctor / physician at your Skilled Nursing Facility, as they may not all be beneficial at this time.  Pneumonia, Adult Pneumonia is an infection of the lungs.  CAUSES Pneumonia may be caused by bacteria or a virus. Usually, these infections are caused by breathing infectious particles into the lungs (respiratory tract). SYMPTOMS   Cough.  Fever.  Chest pain.  Increased rate of breathing.  Wheezing.  Mucus production. DIAGNOSIS  If you have the common symptoms of pneumonia, your caregiver will typically confirm the diagnosis with a chest X-ray. The X-ray will show an abnormality in the lung (pulmonary infiltrate) if you have pneumonia. Other tests of your blood, urine, or sputum may be done to find the specific cause of your pneumonia. Your caregiver may also do tests (blood gases or pulse oximetry) to see how well your lungs are working. TREATMENT  Some forms of pneumonia may be spread to other people when you cough or sneeze. You may be asked to wear a mask before and during your exam. Pneumonia that is caused by bacteria is treated with antibiotic  medicine. Pneumonia that is caused by the influenza virus may be treated with an antiviral medicine. Most other viral infections must run their course. These infections will not respond to antibiotics.  PREVENTION A pneumococcal shot (vaccine) is available to prevent a common bacterial cause of pneumonia. This is usually suggested for:  People over 80 years old.  Patients on chemotherapy.  People with chronic lung problems, such as bronchitis or emphysema.  People with immune system problems. If you are over 65 or have a high risk condition, you may receive the pneumococcal vaccine if you have not received it before. In some countries, a routine influenza vaccine is also recommended. This vaccine can help prevent some cases of pneumonia.You may be offered the influenza vaccine as part of your care. If you smoke, it is time to quit. You may receive instructions on how to stop smoking. Your caregiver can provide medicines and counseling to help you quit. HOME CARE INSTRUCTIONS   Cough suppressants may be used if you are losing too much rest. However, coughing protects you by clearing your lungs. You should avoid using cough suppressants if you can.  Your caregiver may have prescribed medicine if he or she thinks your pneumonia is caused by a bacteria or influenza. Finish your medicine even if you start to feel better.  Your caregiver may also prescribe an expectorant. This loosens the mucus to be coughed up.  Only take over-the-counter or prescription medicines for pain, discomfort, or fever as directed by your caregiver.  Do not smoke. Smoking is a common cause of bronchitis and can contribute to pneumonia. If you are a smoker and continue to smoke, your cough may last  several weeks after your pneumonia has cleared.  A cold steam vaporizer or humidifier in your room or home may help loosen mucus.  Coughing is often worse at night. Sleeping in a semi-upright position in a recliner or using  a couple pillows under your head will help with this.  Get rest as you feel it is needed. Your body will usually let you know when you need to rest. SEEK IMMEDIATE MEDICAL CARE IF:   Your illness becomes worse. This is especially true if you are elderly or weakened from any other disease.  You cannot control your cough with suppressants and are losing sleep.  You begin coughing up blood.  You develop pain which is getting worse or is uncontrolled with medicines.  You have a fever.  Any of the symptoms which initially brought you in for treatment are getting worse rather than better.  You develop shortness of breath or chest pain. MAKE SURE YOU:   Understand these instructions.  Will watch your condition.  Will get help right away if you are not doing well or get worse. Document Released: 10/25/2005 Document Revised: 01/17/2012 Document Reviewed: 01/14/2011 [facility] Patient Information 2014 [place], [place].  Respiratory Failure Respiratory failure is when the breathing (respiratory) system fails. This means your lungs are not working well. This happens when the lungs cannot take in enough oxygen or get rid of enough carbon dioxide. Acute respiratory failure may be life-threatening. If blood gasses are done, respiratory failure is present when the PaO2 value is less than 60 mm Hg while breathing air or the PaCO2 is more than 50 mm Hg. CAUSES  Any problem affecting the heart or lungs can be the cause of this. A few of the common causes are:  Chronic bronchitis and emphysema (COPD).  Blood clot going to lung.  Pulmonary edema (water in the lungs).  Collapse of a lung.  Bronchiectasis.  Any problem from muscles or bones that makes breathing difficult or nearly impossible.  Pneumonia.  Obesity.  Asthma.  Pulmonary embolism.  Adult respiratory distress syndrome.  Heart attack where heart cannot pump well enough. SYMPTOMS  Respiratory failure commonly shows up with  the following problems:  Difficulty breathing (dyspnea).  Rapid breathing (tachypnea).  Shortness of breath with even mild activity.  Bluish color of the skin (cyanosis), the skin beneath your nails, and mucous membranes (like the lining of the mouth).  Confusion and drowsiness or sleepiness. DIAGNOSIS  This problem is often discovered by your caregiver while taking a careful history and performing a physical examination. The diagnosis of respiratory failure is then proven by doing arterial blood gas analysis. In addition to other blood work, further x-rays and specialized testing may be needed. The cause of the failure must be found early for best results and survival. TREATMENT  In severe cases, the problem may cause you to need a ventilator. A ventilator is a machine that artificially breathes for you. You will determine which treatment is best for you or your loved one by discussing this with your caregiver. Some of the treatments used include:  Oxygen (in hospital setting or for home use).  Endotracheal tube and ventilator is used for a severe crisis. This is used to increase the amount of oxygen delivered, or to give the muscles needed for breathing a rest. Most ventilation is positive pressure ventilation. This means the breathing machine blows the lungs up much like you would blow up a balloon. Document Released: 10/25/2005 Document Revised: 01/17/2012 Document Reviewed: 03/08/2007  Reviewed: 01/04/2012 °ExitCare® Patient Information ©2014 [employer]. ° °

## 2014-01-06 LAB — CULTURE, BLOOD (ROUTINE X 2): CULTURE: NO GROWTH

## 2014-01-07 MED ORDER — FUROSEMIDE 20 MG PO TABS
20.0000 mg | ORAL_TABLET | Freq: Every day | ORAL | Status: DC
Start: 2014-01-07 — End: 2014-01-08

## 2014-01-07 MED ORDER — TRAZODONE HCL 50 MG PO TABS: 50.0000 mg | ORAL_TABLET | Freq: Every evening | ORAL | Status: DC | PRN

## 2014-01-08 ENCOUNTER — Non-Acute Institutional Stay: Payer: Medicare Other | Admitting: Family Medicine

## 2014-01-08 DIAGNOSIS — J189 Pneumonia, unspecified organism: Secondary | ICD-10-CM

## 2014-01-08 DIAGNOSIS — J9690 Respiratory failure, unspecified, unspecified whether with hypoxia or hypercapnia: Secondary | ICD-10-CM

## 2014-01-08 DIAGNOSIS — I4891 Unspecified atrial fibrillation: Secondary | ICD-10-CM

## 2014-01-08 DIAGNOSIS — J449 Chronic obstructive pulmonary disease, unspecified: Secondary | ICD-10-CM

## 2014-01-08 DIAGNOSIS — J9600 Acute respiratory failure, unspecified whether with hypoxia or hypercapnia: Secondary | ICD-10-CM

## 2014-01-08 NOTE — Progress Notes (Signed)
  [facility] Attending H&P Subjective:    [name], female    DOB: 12/02/1932, 80 y.o.   MRN: [id number]  HPI Patient seen upon readmission following brief hospitalization from 12/31/13 to 01/04/14 for acute on chronic respiratory failure and unresponsiveness.   Please see d.c summary for details.  Patient improved with IV antibiotics, diuresis and discontinuation of all sedatives and narcotics.  Of note, on blood sample had a positive staph gram stain. The other blood culture and urine culture was negative. Of note she is currently on Levaquin until 01/13/14 to cover HCAP.   Today patient is sitting comfortably in her room with her daughter, Tammy Salinas. She complains of feeling sick all the time, nausea, chest pain, shortness of breath and foot swelling.   After my exam her daughter spoke to me outside and asked that her mother not be started on any new medications to treat complaints as she has a pattern of complaining without having supporting signs and symptoms which contributed to polypharmacy in the recent past.   Review of Systems As per HPI     Objective:   Physical Exam BP 114/75  Pulse 84  Temp(Src) 96.9 F (36.1 C)  Resp 20  Wt 203 lb (92.08 kg) on 4 L [place]  General appearance: alert, cooperative and no distress Lungs: clear to auscultation bilaterally Heart: irregularly irregular rhythm Extremities: b/l foot edema. no pretibial edema.  Neurologic: alert. Oriented to person only, name, DOB. R sided palsy noted.   Lab Results  Component Value Date   HGB 11.7* 01/04/2014          Assessment & Plan:

## 2014-01-08 NOTE — Assessment & Plan Note (Signed)
A: improving. P: continue levaquin until 01/13/13 Continue daily inhalers for COPD

## 2014-01-08 NOTE — Assessment & Plan Note (Addendum)
A: persistent. Agree with deferring anticoagulation. Patient has significant COPD and dementia which are the greater morbidity.

## 2014-01-08 NOTE — Assessment & Plan Note (Signed)
Resolved. Patient back to baseline.

## 2014-01-14 NOTE — Progress Notes (Signed)
Clinical social worker assisted with patient discharge to skilled nursing facility, [facility].  CSW addressed all family questions and concerns. CSW copied chart and added all important documents. CSW also set up patient transportation with [facility]. Clinical Social Worker will sign off for now as social work intervention is no longer needed.   Kristilyn Coltrane, MSW, LCSWA [phone] 

## 2014-01-22 ENCOUNTER — Encounter: Payer: Self-pay | Admitting: Sports Medicine

## 2014-01-22 MED ORDER — GABAPENTIN 300 MG PO CAPS
300.0000 mg | ORAL_CAPSULE | Freq: Three times a day (TID) | ORAL | Status: AC
Start: 2014-01-22 — End: ?

## 2014-02-04 ENCOUNTER — Encounter: Payer: Self-pay | Admitting: Pharmacist

## 2014-02-05 ENCOUNTER — Telehealth: Payer: Self-pay | Admitting: Sports Medicine

## 2014-02-05 NOTE — Telephone Encounter (Signed)
[facility] Patient Tammy ShamsLeona C Salinas 80 y.o. female  MRN: [id number]  DOB: [birth year] PCP: Andrena MewsMichael D Rigby, DO      Situation:  [facility] reporting changes to meds due to Agitation and combativeness. Discussed with Son who is in agreement.    Assessment & Plan: Trazodone d/c on 3/27 and seroquel increased to 50mg  qhs. Over weekend, Seroquel increased to 50mg  qhs & 25mg  qAM. Will plan keep 50mg  qhs dosing long term.  Hope to be able to stop 25mg  qam dose to help decreased daytime sedation in the next 7-10 days if behaviors improved

## 2014-02-14 ENCOUNTER — Telehealth: Payer: Self-pay | Admitting: Family Medicine

## 2014-02-14 NOTE — Telephone Encounter (Signed)
Spoke with son about mother recently calling out in pain. After long discussion, he is in agreement to increase cymbalta to 60mg . He will evaluate her later this week and if he feels like the change is affecting her too much, we will decrease back down.

## 2014-02-16 ENCOUNTER — Telehealth: Payer: Self-pay | Admitting: Family Medicine

## 2014-02-16 NOTE — Telephone Encounter (Signed)
[facility] from Colin RheinErica Lee asking for clarification of order from Dr. Durene CalHunter yesterday; see phone note. Apparently discussion was had with pt's son and decision was made to increase Cymbalta to 60 mg daily. Pt had indeed previously been on Cymbalta, but per records this was stopped during hospitalization in February due to concern of oversedation. Recent outpt records do not indicate if / when this was restarted. However, feel that restarting 60 mg is reasonable. Verbal order given to restart Cymbalta 60 mg PO daily. Will route this message to PCP, Dr. Berline Choughigby, current geriatrics resident, Dr. Durene CalHunter, and to Dr. Randolm IdolFletke, and will re-add Cymbalta to pt's medication list.  Bobbye Mortonhristopher M Lakayla Barrington, MD PGY-2, [facility] 02/16/2014, 2:45 PM

## 2014-03-06 LAB — LIPID PANEL
CHOLESTEROL: 117 mg/dL (ref 0–200)
HDL: 32 mg/dL — AB (ref 35–70)
LDL Cholesterol: 54 mg/dL
Triglycerides: 157 mg/dL (ref 40–160)

## 2014-03-06 LAB — TSH: TSH: 2.1 u[IU]/mL (ref 0.41–5.90)

## 2014-03-07 ENCOUNTER — Encounter: Payer: Self-pay | Admitting: Family Medicine

## 2014-03-12 ENCOUNTER — Telehealth: Payer: Self-pay | Admitting: Family Medicine

## 2014-03-12 NOTE — Telephone Encounter (Signed)
Spoke with son. He is ok with d/c seroquel. States mom seems as comfortable as she has been in a while on increased Cymbalta.

## 2014-03-13 ENCOUNTER — Non-Acute Institutional Stay: Payer: Medicare Other | Admitting: Family Medicine

## 2014-03-13 DIAGNOSIS — R635 Abnormal weight gain: Secondary | ICD-10-CM

## 2014-03-13 DIAGNOSIS — F0390 Unspecified dementia without behavioral disturbance: Secondary | ICD-10-CM

## 2014-03-13 NOTE — Telephone Encounter (Signed)
Daughter  Psychologist, counselling(Belinda Costner) of patient calls, states Dr. Durene CalHunter had been trying to contact her. Please call her back at [phone]. She states calling around or after 4 pm is best time to catch her.

## 2014-03-13 NOTE — Telephone Encounter (Signed)
White team (or red if patient is red)-please let daughter know the following  Have tried to reach patient over 5x in last week including after 4pm yesterday. Number did not go through. We have established contact with son. Would ask that patient communicate with son or be with mother on Wednesday mornings when we round in the nursing home.   You can also let daughter know that we discontinued seroquel as patient is not calling out anymore on increased cymbalta.

## 2014-03-13 NOTE — Progress Notes (Signed)
  Geriatric Attending Note  Subjective:    [name], female    DOB: 14-Sep-1933, 80 y.o.   MRN: [id number]  HPI Patient seen for routine f/u visit.  Interval events/complaints: none.    Review of Systems Unable to obtain due to advanced dementia     Objective:   Physical Exam BP 137/91  Pulse 69  Temp(Src) 98.7 F (37.1 C)  Resp 20  Wt 195 lb 9.6 oz (88.724 kg) Wt Readings from Last 3 Encounters:  03/12/14 195 lb 9.6 oz (88.724 kg)  01/07/14 203 lb (92.08 kg)  01/04/14 201 lb 9.6 oz (91.445 kg)  General appearance: alert, cooperative and no distress, lying in bed, awake  Lungs: clear to auscultation bilaterally Heart: regular rate and rhythm,  Extremities: edema trace b/l      Assessment & Plan:

## 2014-03-15 ENCOUNTER — Encounter: Payer: Self-pay | Admitting: Family Medicine

## 2014-03-15 NOTE — Assessment & Plan Note (Signed)
A: persistent dementia. Patient still eating. Pleasant.  P: no changes to therapy.

## 2014-03-15 NOTE — Assessment & Plan Note (Signed)
A: weight gain has stopped. P: monitor weight.

## 2014-03-18 LAB — BASIC METABOLIC PANEL
BUN: 29 mg/dL — AB (ref 4–21)
Creatinine: 1.4 mg/dL — AB (ref 0.5–1.1)
GLUCOSE: 107 mg/dL
Potassium: 3.9 mmol/L (ref 3.4–5.3)
SODIUM: 147 mmol/L (ref 137–147)

## 2014-03-18 LAB — HEPATIC FUNCTION PANEL
ALT: 24 U/L (ref 7–35)
AST: 19 U/L (ref 13–35)
Alkaline Phosphatase: 75 U/L (ref 25–125)
Bilirubin, Total: 0.4 mg/dL

## 2014-03-18 LAB — LIPID PANEL
Cholesterol: 107 mg/dL (ref 0–200)
HDL: 27 mg/dL — AB (ref 35–70)
LDL Cholesterol: 54 mg/dL
Triglycerides: 129 mg/dL (ref 40–160)

## 2014-03-19 ENCOUNTER — Encounter: Payer: Self-pay | Admitting: Family Medicine

## 2014-03-19 LAB — ALBUMIN: Albumin: 2.8 — AB (ref 3.5–5.2)

## 2014-03-29 ENCOUNTER — Encounter: Payer: Self-pay | Admitting: Pharmacist

## 2014-05-15 ENCOUNTER — Encounter: Payer: Self-pay | Admitting: Family Medicine

## 2014-05-15 DIAGNOSIS — I2721 Secondary pulmonary arterial hypertension: Secondary | ICD-10-CM | POA: Insufficient documentation

## 2014-05-19 ENCOUNTER — Non-Acute Institutional Stay (INDEPENDENT_AMBULATORY_CARE_PROVIDER_SITE_OTHER): Payer: Medicare Other | Admitting: Family Medicine

## 2014-05-19 ENCOUNTER — Encounter: Payer: Self-pay | Admitting: Family Medicine

## 2014-05-19 DIAGNOSIS — E039 Hypothyroidism, unspecified: Secondary | ICD-10-CM

## 2014-05-19 DIAGNOSIS — I10 Essential (primary) hypertension: Secondary | ICD-10-CM

## 2014-05-19 DIAGNOSIS — E785 Hyperlipidemia, unspecified: Secondary | ICD-10-CM

## 2014-05-19 DIAGNOSIS — I4891 Unspecified atrial fibrillation: Secondary | ICD-10-CM

## 2014-05-19 DIAGNOSIS — I482 Chronic atrial fibrillation, unspecified: Secondary | ICD-10-CM

## 2014-05-19 DIAGNOSIS — K219 Gastro-esophageal reflux disease without esophagitis: Secondary | ICD-10-CM | POA: Insufficient documentation

## 2014-05-19 NOTE — Assessment & Plan Note (Signed)
Controlled on Lipitor. Most recent lipid panel 5/10.  - continue current regimen.

## 2014-05-19 NOTE — Assessment & Plan Note (Signed)
Controlled. Reported by nursing to have decreased appetite as of late. Unable to obtain history from patient. Unsure if any symptoms present.  - continue prilosec 40 mg daily.  - if decline in appetite continues then possibility to increasing dose.

## 2014-05-19 NOTE — Assessment & Plan Note (Signed)
Stable. No benefit with anticoagulation at this point.

## 2014-05-19 NOTE — Progress Notes (Signed)
   Subjective:    [name], female    DOB: [birth year], 80 y.o.   MRN: [id number]  HPI Tammy ShamsLeona C Salinas was evaluated for routine visit.   She was sitting in her chair and watching television. She had no complaints and was sitting comfortably.   Interval events: none. Reported by nursing staff that her appetite has decreased a little.   Current Outpatient Prescriptions on File Prior to Visit  Medication Sig Dispense Refill  . albuterol (PROVENTIL) (2.5 MG/3ML) 0.083% nebulizer solution Take 2.5 mg by nebulization every 4 (four) hours as needed for shortness of breath.      . allopurinol (ZYLOPRIM) 300 MG tablet Take 1 tablet (300 mg total) by mouth daily.      Marland Kitchen. arformoterol (BROVANA) 15 MCG/2ML NEBU Take 2 mLs (15 mcg total) by nebulization 2 (two) times daily.      Marland Kitchen. aspirin EC 325 MG tablet Take 325 mg by mouth daily.      Marland Kitchen. atorvastatin (LIPITOR) 10 MG tablet Take 1 tablet (10 mg total) by mouth at bedtime.      . budesonide (PULMICORT) 0.25 MG/2ML nebulizer solution Take 0.25 mg by nebulization 2 (two) times daily. prior to brovana      . cycloSPORINE (RESTASIS) 0.05 % ophthalmic emulsion Place 1 drop into both eyes 2 (two) times daily.       . DULoxetine (CYMBALTA) 60 MG capsule Take 60 mg by mouth daily.      Marland Kitchen. gabapentin (NEURONTIN) 300 MG capsule Take 1 capsule (300 mg total) by mouth 3 (three) times daily.  90 capsule  3  . levothyroxine (SYNTHROID, LEVOTHROID) 50 MCG tablet Take 1 tablet (50 mcg total) by mouth daily.      Marland Kitchen. loratadine (CLARITIN) 10 MG tablet Take 10 mg by mouth daily as needed for rhinitis.       . Multiple Vitamin (MULTIVITAMIN) tablet Take 1 tablet by mouth daily.       Marland Kitchen. omeprazole (PRILOSEC) 20 MG capsule Take 40 mg by mouth daily.       . polyethylene glycol (GLYCOLAX) packet Take 17 g by mouth every other day. Mix into 8 ounces fluid for constipation      . Propylene Glycol (SYSTANE BALANCE) 0.6 % SOLN Place 1 drop into both eyes 2 (two)  times daily.       . Cholecalciferol 50000 UNITS capsule Take 50,000 Units by mouth every 30 (thirty) days. Given on 14th of month.       No current facility-administered medications on file prior to visit.    Review of Systems See HPI     Objective:   Physical Exam BP 130/62  Pulse 100  Temp(Src) 97.1 F (36.2 C)  Resp 20  Wt 192 lb 3.2 oz (87.181 kg)  SpO2 99% Gen: NAD, cooperative with exam, elderly African American female.   HEENT: NCAT, PERRL, clear conjunctiva, CV: irregular rhythem, S1S2,   Resp: CTABL, no wheezes, non-labored Ext: trace edema b/l  Neuro: alert and only oriented to self, not oriented to place, year, town.  Able to grip with upper extremities. Hx of right sided palsy       Assessment & Plan:

## 2014-05-19 NOTE — Assessment & Plan Note (Signed)
Currently controlled without medications

## 2014-05-19 NOTE — Assessment & Plan Note (Addendum)
Controlled. Most recent TSH (2/1) 1.49.  - continue current dose of synthroid

## 2014-05-22 ENCOUNTER — Encounter: Payer: Self-pay | Admitting: Family Medicine

## 2014-05-22 ENCOUNTER — Non-Acute Institutional Stay: Payer: Medicare Other | Admitting: Family Medicine

## 2014-05-22 DIAGNOSIS — I10 Essential (primary) hypertension: Secondary | ICD-10-CM

## 2014-05-22 DIAGNOSIS — F0390 Unspecified dementia without behavioral disturbance: Secondary | ICD-10-CM | POA: Diagnosis not present

## 2014-05-22 DIAGNOSIS — I4891 Unspecified atrial fibrillation: Secondary | ICD-10-CM

## 2014-05-22 NOTE — Progress Notes (Signed)
[name], female   DOB: 02-17-1933, 80 y.o.   MRN: [id number]   Subjective:    [name], female    DOB: June 09, 1933, 80 y.o.   MRN: [id number]  I have seen and examined this patient. I have discussed with Dr Tammy Salinas.  I agree with their findings and plans as documented in their regulatory visit note from 05/19/14. My changes and additions are in Red Text.     HPI Tammy Salinas was evaluated for routine visit.   She was sitting in her chair and watching television . She had no complaints and was sitting comfortably.   Interval events: none. Reported by nursing staff that her appetite has decreased a little.  Gout Longstanding problem Taking allopurinol 300 mg daily Patient's daily colchicine stopped end of June. No recurrence of gout symptoms.  No redness or swelling of joints  Atrial fibrillation Longstanding issue Not on medication for rate control of anticoagulation  Not interfering with care of patient No extremity swelling or shortness of breath complaint.   Hypertension  Disease Monitoring  Blood pressure range: 130s/60s  Chest pain: no   Dyspnea: no    Medication compliance: on no medications  No evidence of new end organ injury.   Dementia / Cognitive impairment - Onset: Longstanding.  Complicated by comorbid Schizophrenia.  - Change in ADLs / iADLs: Dependent in all ADLs - Less yellling out behavior. No reports of aggressive behavior from staff.  - No clear evidence of delusions / hallucinations - Dementia Medications: none - Behavoral and Psychiatric Medications: Duloxetine - FAST Stage 6 to 7.   Current Outpatient Prescriptions on File Prior to Visit  Medication Sig Dispense Refill  . acetaminophen (TYLENOL) 650 MG CR tablet Take 650 mg by mouth every 6 (six) hours as needed for fever (Temp >101).      Marland Kitchen albuterol (PROVENTIL) (2.5 MG/3ML) 0.083% nebulizer solution Take 2.5 mg by nebulization every 4 (four) hours as  needed for shortness of breath.      . allopurinol (ZYLOPRIM) 300 MG tablet Take 1 tablet (300 mg total) by mouth daily.      Marland Kitchen arformoterol (BROVANA) 15 MCG/2ML NEBU Take 2 mLs (15 mcg total) by nebulization 2 (two) times daily.      Marland Kitchen aspirin EC 325 MG tablet Take 325 mg by mouth daily.      Marland Kitchen atorvastatin (LIPITOR) 10 MG tablet Take 1 tablet (10 mg total) by mouth at bedtime.      . budesonide (PULMICORT) 0.25 MG/2ML nebulizer solution Take 0.25 mg by nebulization 2 (two) times daily. prior to brovana      . Cholecalciferol 50000 UNITS capsule Take 50,000 Units by mouth every 30 (thirty) days. Given on 14th of month.      . cycloSPORINE (RESTASIS) 0.05 % ophthalmic emulsion Place 1 drop into both eyes 2 (two) times daily.       . DULoxetine (CYMBALTA) 60 MG capsule Take 60 mg by mouth daily.      Marland Kitchen gabapentin (NEURONTIN) 300 MG capsule Take 1 capsule (300 mg total) by mouth 3 (three) times daily.  90 capsule  3  . levothyroxine (SYNTHROID, LEVOTHROID) 50 MCG tablet Take 1 tablet (50 mcg total) by mouth daily.      Marland Kitchen loratadine (CLARITIN) 10 MG tablet Take 10 mg by mouth daily as needed for rhinitis.       . Multiple Vitamin (MULTIVITAMIN) tablet Take 1 tablet by mouth daily.       Marland Kitchen  omeprazole (PRILOSEC) 20 MG capsule Take 40 mg by mouth daily.       . polyethylene glycol (GLYCOLAX) packet Take 17 g by mouth every other day. Mix into 8 ounces fluid for constipation      . Propylene Glycol (SYSTANE BALANCE) 0.6 % SOLN Place 1 drop into both eyes 2 (two) times daily.        No current facility-administered medications on file prior to visit.    Review of Systems See HPI     Objective:   Physical Exam BP 130/62  Pulse 100  Temp(Src) 97.1 F (36.2 C)  Resp 20  SpO2 99% Gen: NAD, cooperative with exam, elderly African American female.   HEENT: NCAT, PERRL, clear conjunctiva, CV: irregular rhythem, S1S2,   Resp: CTABL, no wheezes, non-labored Ext: Wearing Graduated compression  stocking     Assessment & Plan:

## 2014-05-22 NOTE — Assessment & Plan Note (Addendum)
Stable. FAST Stage 6 to 7 (dependence in ADLs, difficulty with language) Watch for mechanical and infectious complication of Advance stage dementia.

## 2014-05-22 NOTE — Assessment & Plan Note (Signed)
Adequate blood pressure control.  No evidence of new end organ damage.  Continue Diet and lifestyle managemnt.

## 2014-05-22 NOTE — Assessment & Plan Note (Signed)
Stable.  Not requiring chronotropic nor anticoagulation therapies.

## 2014-06-18 ENCOUNTER — Telehealth: Payer: Self-pay | Admitting: *Deleted

## 2014-06-18 NOTE — Telephone Encounter (Signed)
Received a call from BrandonBrandy, NP with [facility].  She decreased pt's Lipitor from 10 mg to 5 mg daily due to pt's LDL level in 50s.  If you have questions are would like to discuss this her please give her a call at [phone].  Clovis PuMartin, Tamika L, RN

## 2014-06-25 ENCOUNTER — Encounter: Payer: Self-pay | Admitting: Pharmacist

## 2014-07-26 ENCOUNTER — Encounter: Payer: Self-pay | Admitting: Family Medicine

## 2014-07-26 ENCOUNTER — Non-Acute Institutional Stay (INDEPENDENT_AMBULATORY_CARE_PROVIDER_SITE_OTHER): Payer: Medicare Other | Admitting: Family Medicine

## 2014-07-26 DIAGNOSIS — M109 Gout, unspecified: Secondary | ICD-10-CM

## 2014-07-26 DIAGNOSIS — I4891 Unspecified atrial fibrillation: Secondary | ICD-10-CM

## 2014-07-26 DIAGNOSIS — E039 Hypothyroidism, unspecified: Secondary | ICD-10-CM

## 2014-07-26 LAB — TSH: TSH: 0.875

## 2014-07-26 LAB — URIC ACID: Uric Acid: 5.7

## 2014-07-26 NOTE — Assessment & Plan Note (Signed)
History of. Currently asymptomatic. Most recent uric acid level within normal limits.  - allopurinol was decreased to 200 mg daily  - colchicine was stopped in June 2015.

## 2014-07-26 NOTE — Assessment & Plan Note (Signed)
Controlled. Most recent TSH with normal limits.  - continue current dose.  - in the future, if TSH continues to drop; could consider decreasing synthroid.

## 2014-07-26 NOTE — Assessment & Plan Note (Signed)
Stable. Rate controlled with no medications. No currently anticoagulated.

## 2014-07-26 NOTE — Progress Notes (Signed)
Subjective:    [name], female    DOB: Feb 05, 1933, 80 y.o.   MRN: [id number]  HPI   [name] was evaluated for routine visit.  Interval events: none. Reported by CMA that she has occasional outbursts but is re-directed easily.   Gout  Longstanding problem  Uric acid (5.7) within normal limits on recent lab draw.  Allopurinol decreased to 200 mg daily  Patient's daily colchicine stopped end of June.  No recurrence of gout symptoms.  No redness or swelling of joints   Atrial fibrillation  Longstanding issue  Not on medication for rate control of anticoagulation  Not interfering with care of patient  No extremity swelling or shortness of breath complaint.   Hypothyroidism Longstanding problem  TSH within normal range Continue synthroid.  Asymptomatic     Current Outpatient Prescriptions on File Prior to Visit  Medication Sig Dispense Refill  . acetaminophen (TYLENOL) 500 MG tablet Take 1,000 mg by mouth 3 (three) times daily.      Marland Kitchen acetaminophen (TYLENOL) 650 MG CR tablet Take 650 mg by mouth every 6 (six) hours as needed for fever (Temp >101).      Marland Kitchen albuterol (PROVENTIL) (2.5 MG/3ML) 0.083% nebulizer solution Take 2.5 mg by nebulization every 4 (four) hours as needed for shortness of breath.      . allopurinol (ZYLOPRIM) 300 MG tablet Take 200 mg by mouth daily.       Marland Kitchen arformoterol (BROVANA) 15 MCG/2ML NEBU Take 2 mLs (15 mcg total) by nebulization 2 (two) times daily.      . budesonide (PULMICORT) 0.25 MG/2ML nebulizer solution Take 0.25 mg by nebulization 2 (two) times daily. prior to brovana      . Cholecalciferol 50000 UNITS capsule Take 50,000 Units by mouth every 30 (thirty) days. Given on 14th of month.      . cycloSPORINE (RESTASIS) 0.05 % ophthalmic emulsion Place 1 drop into both eyes 2 (two) times daily.       . DULoxetine (CYMBALTA) 60 MG capsule Take 60 mg by mouth daily.      Marland Kitchen gabapentin (NEURONTIN) 300 MG capsule Take 1 capsule  (300 mg total) by mouth 3 (three) times daily.  90 capsule  3  . levothyroxine (SYNTHROID, LEVOTHROID) 50 MCG tablet Take 1 tablet (50 mcg total) by mouth daily.      Marland Kitchen loratadine (CLARITIN) 10 MG tablet Take 10 mg by mouth daily as needed for rhinitis.       . Multiple Vitamin (MULTIVITAMIN) tablet Take 1 tablet by mouth daily.       Marland Kitchen omeprazole (PRILOSEC) 20 MG capsule Take 40 mg by mouth daily.       . polyethylene glycol (GLYCOLAX) packet Take 17 g by mouth every other day. Mix into 8 ounces fluid for constipation      . Propylene Glycol (SYSTANE BALANCE) 0.6 % SOLN Place 1 drop into both eyes 2 (two) times daily.        No current facility-administered medications on file prior to visit.    Health Maintenance: Will need flu vaccine   Review of Systems See HPI     Objective:   Physical Exam BP 134/90  Pulse 76  Temp(Src) 97.6 F (36.4 C)  Resp 20  Wt 188 lb (85.276 kg)  SpO2 95% Gen: NAD, cooperative with exam, elderly African American female. Seated in Wheel chair  HEENT: NCAT, clear conjunctiva,  CV: irregular rhythm, regular rate, S1S2,  Resp:  CTABL, no wheezes, non-labored  Ext: wearing compression stockings  Neuro: alert and only oriented to self, not oriented to place, year, town.  Hx of right sided palsy       Assessment & Plan:

## 2014-08-01 ENCOUNTER — Encounter (HOSPITAL_COMMUNITY): Payer: Self-pay | Admitting: Pharmacist

## 2014-08-02 NOTE — Progress Notes (Signed)
[name], female   DOB: 10/01/1933, 80 y.o.   MRN: [id number] I discussed with Dr Jordan Likes.  I agree with their plans documented in their regulatory visit note.

## 2014-08-07 ENCOUNTER — Encounter (HOSPITAL_COMMUNITY): Payer: Self-pay | Admitting: Pharmacist

## 2014-08-22 ENCOUNTER — Encounter: Payer: Self-pay | Admitting: Pharmacist

## 2014-09-18 LAB — CBC AND DIFFERENTIAL
HCT: 38 % (ref 36–46)
HEMOGLOBIN: 12.6 g/dL (ref 12.0–16.0)
Platelets: 243 10*3/uL (ref 150–399)
WBC: 6.4 10^3/mL

## 2014-09-18 LAB — LIPID PANEL
Cholesterol: 142 mg/dL (ref 0–200)
HDL: 34 mg/dL — AB (ref 35–70)
LDL Cholesterol: 84 mg/dL
TRIGLYCERIDES: 122 mg/dL (ref 40–160)

## 2014-09-18 LAB — BASIC METABOLIC PANEL
BUN: 29 mg/dL — AB (ref 4–21)
Creatinine: 1.2 mg/dL — AB (ref 0.5–1.1)
Glucose: 114 mg/dL
POTASSIUM: 3.7 mmol/L (ref 3.4–5.3)
SODIUM: 148 mmol/L — AB (ref 137–147)

## 2014-09-18 LAB — HEPATIC FUNCTION PANEL
ALT: 22 U/L (ref 7–35)
AST: 31 U/L (ref 13–35)
Alkaline Phosphatase: 85 U/L (ref 25–125)
BILIRUBIN, TOTAL: 0.5 mg/dL

## 2014-09-18 LAB — TSH: TSH: 1.85 u[IU]/mL (ref 0.41–5.90)

## 2014-09-23 ENCOUNTER — Non-Acute Institutional Stay: Payer: Medicare Other | Admitting: Family Medicine

## 2014-09-23 ENCOUNTER — Encounter: Payer: Self-pay | Admitting: Family Medicine

## 2014-09-23 DIAGNOSIS — I10 Essential (primary) hypertension: Secondary | ICD-10-CM

## 2014-09-23 DIAGNOSIS — I4891 Unspecified atrial fibrillation: Secondary | ICD-10-CM

## 2014-09-23 DIAGNOSIS — E785 Hyperlipidemia, unspecified: Secondary | ICD-10-CM

## 2014-09-23 DIAGNOSIS — R634 Abnormal weight loss: Secondary | ICD-10-CM

## 2014-09-23 LAB — BASIC METABOLIC PANEL
BUN: 39 mg/dL — AB (ref 4–21)
Creatinine: 1.7 mg/dL — AB (ref 0.5–1.1)
GLUCOSE: 107 mg/dL
Potassium: 4.3 mmol/L (ref 3.4–5.3)
SODIUM: 153 mmol/L — AB (ref 137–147)

## 2014-09-23 LAB — HEPATIC FUNCTION PANEL
ALT: 27 U/L (ref 7–35)
AST: 32 U/L (ref 13–35)
Alkaline Phosphatase: 76 U/L (ref 25–125)
Bilirubin, Total: 0.5 mg/dL

## 2014-09-23 LAB — LIPID PANEL
CHOLESTEROL: 145 mg/dL (ref 0–200)
HDL: 30 mg/dL — AB (ref 35–70)
LDL Cholesterol: 90 mg/dL
Triglycerides: 125 mg/dL (ref 40–160)

## 2014-09-23 LAB — TSH: TSH: 1 u[IU]/mL (ref 0.41–5.90)

## 2014-09-23 LAB — CBC AND DIFFERENTIAL
HCT: 38 % (ref 36–46)
HEMOGLOBIN: 11.9 g/dL — AB (ref 12.0–16.0)
Platelets: 189 10*3/uL (ref 150–399)
WBC: 6.6 10^3/mL

## 2014-09-23 NOTE — Progress Notes (Signed)
Subjective:    [name], female    DOB: Nov 16, 1932, 80 y.o.   MRN: [id number]  HPI  Tammy Salinas was evaluated for routine visit.   Interval events: she was started on Remeron due to her continued weight loss. She seemed to tolerate the medication but her weight did not responding.   Afib:  Longstanding issue  Not on any current medication for rate control or anticoagulation.  No extremity swelling or SOB   HTN:  Longstanding issue. Asymptomatic.  No currently being treated.  Labs pending: CMP, TSH Adhering to diet and lifestyle management   HLD:  Currently being treated with lipitor 10 mg  Lab pending: lipid panel  Adhering to lifestyle management   Continued weight loss: patient has decreased appetite. She will eat 25% of her meals at times and more at others. No indication that the texture of the food is bothering her. Remeron was started and increased to 15 mg daily. There was no significant increase in her weight (181>181.2 lbs).    Current Outpatient Prescriptions on File Prior to Visit  Medication Sig Dispense Refill  . acetaminophen (TYLENOL) 650 MG CR tablet Take 650 mg by mouth every 6 (six) hours as needed for fever (Temp >101).    Marland Kitchen. albuterol (PROVENTIL) (2.5 MG/3ML) 0.083% nebulizer solution Take 2.5 mg by nebulization every 4 (four) hours as needed for shortness of breath.    . allopurinol (ZYLOPRIM) 300 MG tablet Take 200 mg by mouth daily.     Marland Kitchen. arformoterol (BROVANA) 15 MCG/2ML NEBU Take 2 mLs (15 mcg total) by nebulization 2 (two) times daily.    Marland Kitchen. aspirin 81 MG tablet Take 81 mg by mouth daily.    Marland Kitchen. atorvastatin (LIPITOR) 10 MG tablet Take 5 mg by mouth daily.    . budesonide (PULMICORT) 0.25 MG/2ML nebulizer solution Take 0.25 mg by nebulization 2 (two) times daily. prior to brovana    . calcium citrate (CALCITRATE - DOSED IN MG ELEMENTAL CALCIUM) 950 MG tablet Take 400 mg of elemental calcium by mouth daily.    . Cholecalciferol 50000  UNITS capsule Take 50,000 Units by mouth every 30 (thirty) days. Given on 14th of month.    . cycloSPORINE (RESTASIS) 0.05 % ophthalmic emulsion Place 1 drop into both eyes 2 (two) times daily.     . DULoxetine (CYMBALTA) 60 MG capsule Take 60 mg by mouth daily.    Marland Kitchen. gabapentin (NEURONTIN) 300 MG capsule Take 1 capsule (300 mg total) by mouth 3 (three) times daily. 90 capsule 3  . levothyroxine (SYNTHROID, LEVOTHROID) 50 MCG tablet Take 1 tablet (50 mcg total) by mouth daily.    Marland Kitchen. loratadine (CLARITIN) 10 MG tablet Take 10 mg by mouth daily as needed for rhinitis.     . Mirtazapine (REMERON PO) Take 15 mg by mouth at bedtime. Remeron gel    . Multiple Vitamin (MULTIVITAMIN) tablet Take 1 tablet by mouth daily.     Marland Kitchen. omeprazole (PRILOSEC) 20 MG capsule Take 20 mg by mouth daily.     . polyethylene glycol (GLYCOLAX) packet Take 17 g by mouth every other day. Mix into 8 ounces fluid for constipation    . Propylene Glycol (SYSTANE BALANCE) 0.6 % SOLN Place 1 drop into both eyes 2 (two) times daily.      No current facility-administered medications on file prior to visit.    Review of Systems See HPI     Objective:   Physical Exam BP  173/104 mmHg  Pulse 105  Temp(Src) 98.9 F (37.2 C)  Resp 20  Wt 181 lb 3.2 oz (82.192 kg)  SpO2 99% Gen: NAD, cooperative with exam, elderly African American female. Sitting in bed being fed.  HEENT: NCAT, clear conjunctiva,  CV: irregular irregular rhythm, mild tachycardia, S1S2,  Resp: CTABL, no wheezes, non-labored  Neuro: Not answering any questions. Hx of right sided palsy        Assessment & Plan:

## 2014-09-23 NOTE — Assessment & Plan Note (Signed)
Continue Lipitor 10 mg daily and diet modifications.  - lipid panel pending.

## 2014-09-23 NOTE — Assessment & Plan Note (Signed)
Most recent rate of 105. Asymptomatic.  - consider adding a low dose of lopressor. 12.5 mg BID

## 2014-09-23 NOTE — Assessment & Plan Note (Signed)
Continues to lose weight despite the addition of Remeron. Discussed with her son that it may be that she is succumbing to all of her chronic illnesses. He was ok with this during the discussion.  - will dc Remeron

## 2014-09-23 NOTE — Assessment & Plan Note (Signed)
Uncontrolled. Asymptomatic.  - if adding lopressor for rate control, may given some benefit to BP  - CBC, CMP TSH pending

## 2014-09-24 NOTE — Addendum Note (Signed)
Addended by: Acquanetta BellingMCDIARMID, Evalisse Prajapati D on: 09/24/2014 09:46 AM   Modules accepted: Level of Service

## 2014-09-24 NOTE — Progress Notes (Signed)
I have seen and examined this patient. I have discussed with Dr Jordan LikesSchmitz.  I agree with their findings and plans as documented in their regulatory visit note.  Patient is unable to participate in interview due to severe dementia.   I spoke with Tammy Salinas's son, Tammy Salinas, at bedside on 09/23/14.   We discussed her poor per oral intake resulting in decrease in fluid and nutritional intake with chronic mild hypernatremia since February 2015.   His concern is that patient does not reach for bedside water unless it is put to her mouth.  He feels the patient eats better when handfeed slowly.   He reports that his mother will speak in full sentences when he visits, though she rarely does this with staff.   Tammy Salinas is dependent in all ADLs, her use of language is reduced, her per oral intake is reduced, she is non-ambulatory.  Patient has dementia  Her Palliative Performance Scale is 30%.  No behavioral issues.   Weight loss from 213 lbs in January 2015 to 181 lbs currently, 32 lbs weight loss (15% weight loss) likely multifactorial in patients with dementia.  Major contributor is her  poor per oral intake on average which is likely a manifestation of her advancing neurodegenerative process.   I agree with plan to stop the mirtazapine then monitor percent meal intake.  Recommend discussion with NH nutritionist to discuss patient's therapeutic diet with consideration of liberalizing restrictions, e.g. stopping low sodium, low fat / cholesterol diets.   Other medications associated with weight loss in elderly include her atorvastatin which can be associated with nausea.   Patient's medication list includes two as needed  acetaminophens.  I would recommend that patient have only one.    I agree with starting a low dose metoprolol 12.5 mg twice daily with goal of keeping her Afib heart rate less than 100 on average.   I spoke with Tammy Salinas, patient's son, about the progression of patients dementia with  weight loss.  Should patient's weight loss continue and patient lose more language capacity, she would have a Hospice Qualifying diagnosis for dementia.  He would likely be open to that discussion of Hospice enrollment for his mother.   Tammy Salinas is (+)DNR/DNI currently.

## 2014-09-26 ENCOUNTER — Encounter: Payer: Self-pay | Admitting: Pharmacist

## 2014-09-30 LAB — BASIC METABOLIC PANEL
BUN: 58 mg/dL — AB (ref 4–21)
CREATININE: 1.9 mg/dL — AB (ref 0.5–1.1)
Glucose: 103 mg/dL
Potassium: 4.5 mmol/L (ref 3.4–5.3)
Sodium: 156 mmol/L — AB (ref 137–147)

## 2014-09-30 LAB — LIPID PANEL
CHOLESTEROL: 173 mg/dL (ref 0–200)
HDL: 25 mg/dL — AB (ref 35–70)
LDL Cholesterol: 93 mg/dL

## 2014-09-30 LAB — HEPATIC FUNCTION PANEL
ALT: 19 U/L (ref 7–35)
AST: 24 U/L (ref 13–35)
Alkaline Phosphatase: 73 U/L (ref 25–125)
BILIRUBIN, TOTAL: 0.6 mg/dL

## 2014-09-30 LAB — TSH: TSH: 2.14 u[IU]/mL (ref 0.41–5.90)

## 2014-10-02 ENCOUNTER — Encounter: Payer: Self-pay | Admitting: Family Medicine

## 2014-10-02 LAB — CALCIUM
Albumin: 2.8 g/dl
CALCIUM: 9.8 mg/dL
Calcium: 10 mg/dL

## 2014-10-07 ENCOUNTER — Encounter: Payer: Self-pay | Admitting: Family Medicine

## 2014-10-07 LAB — TSH: TSH: 1.001

## 2014-10-08 ENCOUNTER — Other Ambulatory Visit: Payer: Self-pay | Admitting: Family Medicine

## 2014-10-08 LAB — ALBUMIN: Albumin: 2.8

## 2014-10-28 ENCOUNTER — Telehealth: Payer: Self-pay | Admitting: Family Medicine

## 2014-10-28 NOTE — Telephone Encounter (Signed)
Placed in PCP box for completion 

## 2014-10-28 NOTE — Telephone Encounter (Signed)
Daughter brought in paperwork to be completed for FMLA for her job,

## 2014-11-04 ENCOUNTER — Telehealth: Payer: Self-pay | Admitting: *Deleted

## 2014-11-04 ENCOUNTER — Telehealth: Payer: Self-pay | Admitting: Family Medicine

## 2014-11-04 NOTE — Telephone Encounter (Signed)
Form completed and placed in Tamika's box.   Myra RudeJeremy E Schmitz, MD PGY-2, [facility] 11/04/2014, 5:04 PM

## 2014-11-04 NOTE — Telephone Encounter (Signed)
Informed that FMLA paperwork was faxed to number provided [phone]. Forms copied for scanning in pt's record.  Original forms ready for pick up. Clovis PuMartin, Din Bookwalter L, RN

## 2014-11-15 ENCOUNTER — Encounter: Payer: Self-pay | Admitting: Family Medicine

## 2014-11-15 ENCOUNTER — Non-Acute Institutional Stay (INDEPENDENT_AMBULATORY_CARE_PROVIDER_SITE_OTHER): Payer: Medicaid Other | Admitting: Family Medicine

## 2014-11-15 DIAGNOSIS — R634 Abnormal weight loss: Secondary | ICD-10-CM

## 2014-11-15 DIAGNOSIS — I4891 Unspecified atrial fibrillation: Secondary | ICD-10-CM

## 2014-11-15 DIAGNOSIS — I10 Essential (primary) hypertension: Secondary | ICD-10-CM

## 2014-11-15 NOTE — Progress Notes (Signed)
Subjective:    [name], female    DOB: 11/24/1932, 81 y.o.   MRN: [id number]  HPI  [name] was evaluated for routine visit.   Interval events: Remeron and atorvastatin have been stopped due to continued weight loss. Lopressor added for HR control. Patient has been made comfort care. She has been using 2L O2 at night.   Afib:  Rate controlled  Longstanding issue  Lopressor added  No LE swelling or SOB   HTN:  Longstanding issue  Asymptomatic  Worsening Kidney function  TSH normal   Weight loss:  Diet has been liberated in order to help PO intake  Remeron and atorvastatin stopped  Monitoring her percent meal intake    Current Outpatient Prescriptions on File Prior to Visit  Medication Sig Dispense Refill  . acetaminophen (TYLENOL) 500 MG tablet Take 1,000 mg by mouth every 8 (eight) hours as needed for moderate pain.    Marland Kitchen acetaminophen (TYLENOL) 650 MG CR tablet Take 650 mg by mouth every 6 (six) hours as needed for fever (Temp >101).    Marland Kitchen albuterol (PROVENTIL) (2.5 MG/3ML) 0.083% nebulizer solution Take 2.5 mg by nebulization every 4 (four) hours as needed for shortness of breath.    . allopurinol (ZYLOPRIM) 300 MG tablet Take 200 mg by mouth daily.     Marland Kitchen aspirin 81 MG tablet Take 81 mg by mouth daily.    . calcium citrate (CALCITRATE - DOSED IN MG ELEMENTAL CALCIUM) 950 MG tablet Take 400 mg of elemental calcium by mouth daily.    . Cholecalciferol 50000 UNITS capsule Take 50,000 Units by mouth every 30 (thirty) days. Given on 14th of month.    . cycloSPORINE (RESTASIS) 0.05 % ophthalmic emulsion Place 1 drop into both eyes 2 (two) times daily.     . DULoxetine (CYMBALTA) 60 MG capsule Take 30 mg by mouth daily.     Marland Kitchen gabapentin (NEURONTIN) 300 MG capsule Take 1 capsule (300 mg total) by mouth 3 (three) times daily. 90 capsule 3  . levothyroxine (SYNTHROID, LEVOTHROID) 50 MCG tablet Take 1 tablet (50 mcg total) by mouth daily.    Marland Kitchen loratadine  (CLARITIN) 10 MG tablet Take 10 mg by mouth daily as needed for rhinitis.     . metoprolol tartrate (LOPRESSOR) 25 MG tablet Take 12.5 mg by mouth 2 (two) times daily.    . Multiple Vitamin (MULTIVITAMIN) tablet Take 1 tablet by mouth daily.     Marland Kitchen omeprazole (PRILOSEC) 20 MG capsule Take 20 mg by mouth daily.     . polyethylene glycol (GLYCOLAX) packet Take 17 g by mouth every other day. Mix into 8 ounces fluid for constipation    . Propylene Glycol (SYSTANE BALANCE) 0.6 % SOLN Place 1 drop into both eyes 2 (two) times daily.     Marland Kitchen arformoterol (BROVANA) 15 MCG/2ML NEBU Take 2 mLs (15 mcg total) by nebulization 2 (two) times daily.    Marland Kitchen atorvastatin (LIPITOR) 10 MG tablet Take 5 mg by mouth daily.    . budesonide (PULMICORT) 0.25 MG/2ML nebulizer solution Take 0.25 mg by nebulization 2 (two) times daily. prior to brovana    . mirtazapine (REMERON) 7.5 MG tablet Take 7.5 mg by mouth at bedtime.     No current facility-administered medications on file prior to visit.   Review of Systems See HPI     Objective:   Physical Exam BP 142/73 mmHg  Pulse 78  Temp(Src) 97.1 F (36.2 C)  Resp 18 Gen: NAD, doesn't communicate during exam but does nod her head to certain questions.  CV: irregularly irregular, good S1/S2,  Resp: CTABL, no wheezes, non-labored Ext: b/l feet in soft boots  Neuro: Hx of right sided palsy        Assessment & Plan:

## 2014-11-15 NOTE — Assessment & Plan Note (Signed)
Worsening despite interventions. Stopped Remeron and atorvastatin  - continue to count percent of meals eaten  - liberation of diet.

## 2014-11-15 NOTE — Assessment & Plan Note (Signed)
Lopressor added and is rate controlled  - continue lopressor

## 2014-11-15 NOTE — Assessment & Plan Note (Signed)
More controlled since lopressor has been added for rate control.

## 2014-11-16 IMAGING — CR DG CHEST 1V PORT
1 series · 1 of 1 positions shown · non-contrast
Comparison: DG CHEST 2 VIEW dated 07/18/2013

CLINICAL DATA: Lethargy, hypoxemic.

EXAM:
PORTABLE CHEST - 1 VIEW

[AP]
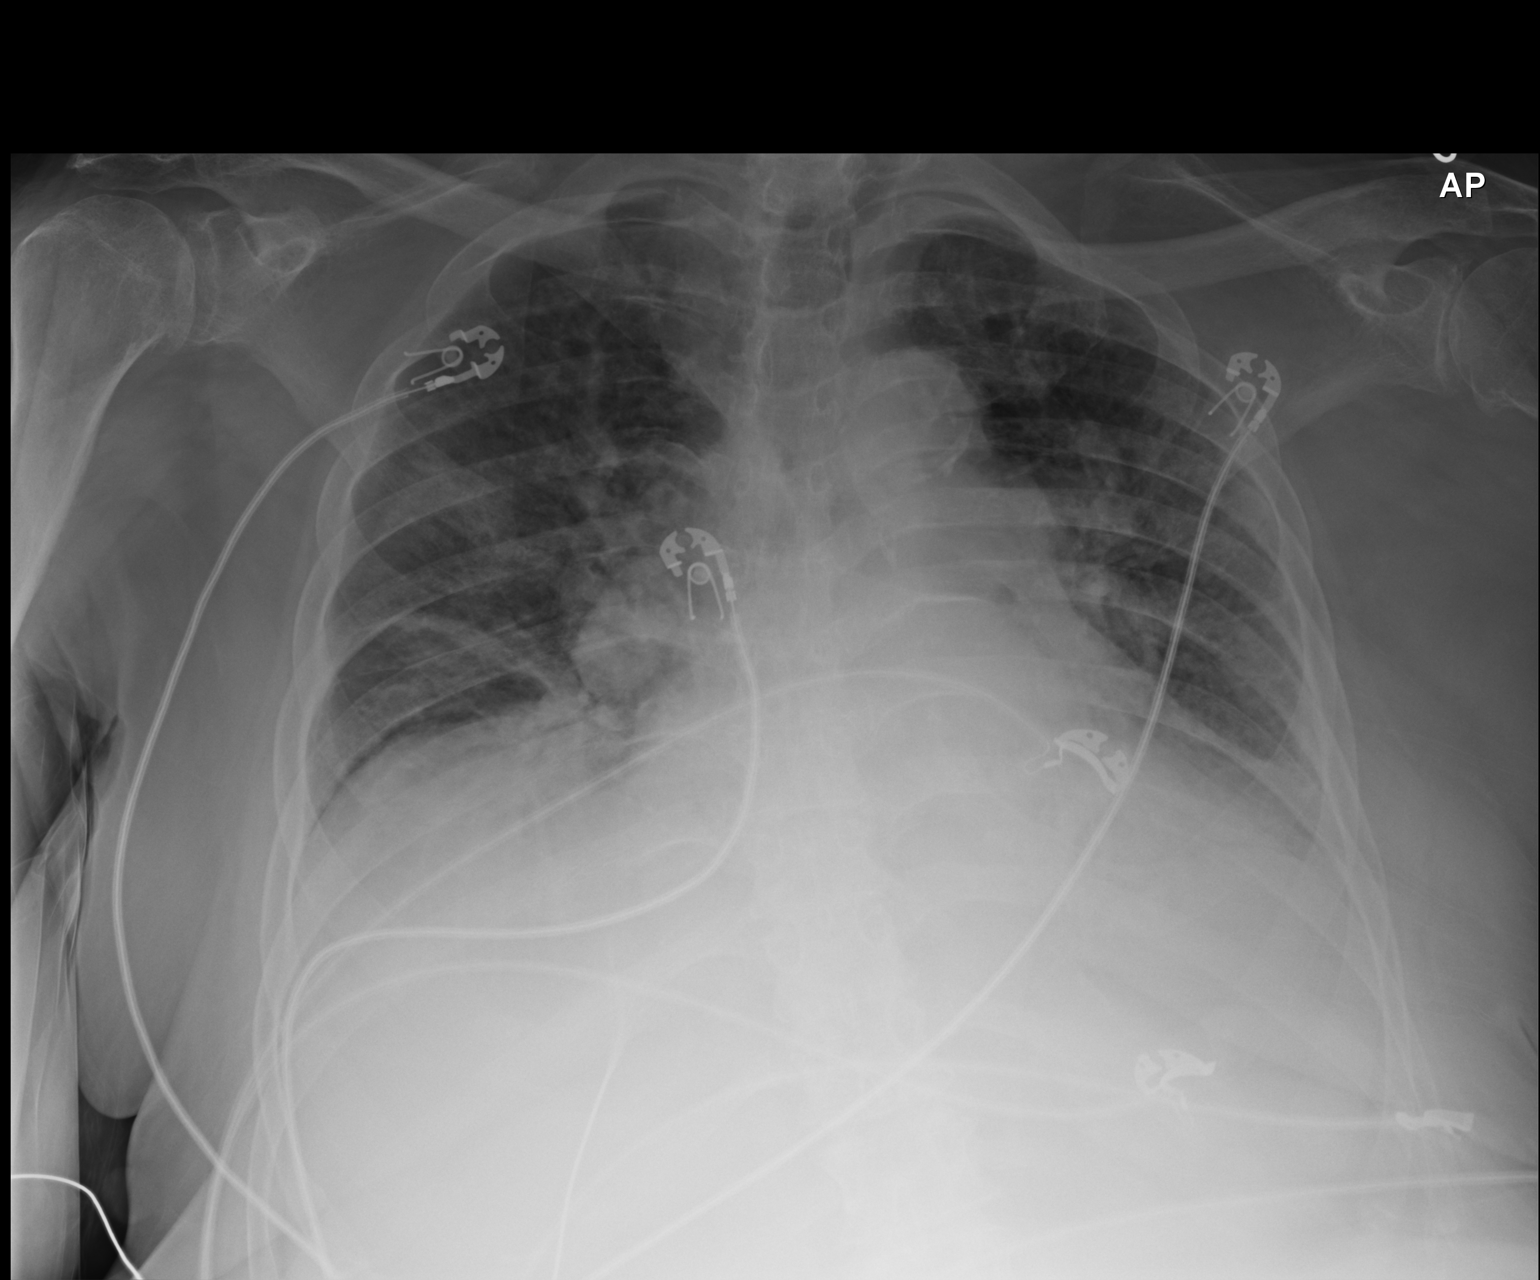

[1 of 1 positions shown; findings below may reference images not displayed]

FINDINGS: Cardiac silhouette appears at least mildly enlarged even with
consideration to this low inspiratory portable examination with
crowded vasculature markings. Moderately calcified aortic knob.
Similar fullness of pulmonary hila. Increasing interstitial
prominence with patchy airspace opacity in the right upper lobe.
Pleural thickening along the right fissure versus subsegmental
atelectasis, mildly elevated right hemidiaphragm. Increasing
retrocardiac airspace opacity. Suspected small left pleural
effusion. No pneumothorax.

Partially imaged ACDF. Multiple EKG lines overlie the patient and
may obscure subtle underlying pathology. Osseous structures are
nonsuspicious, at least moderate degenerative change of the
shoulders.
IMPRESSION: Increasing interstitial prominence, with the right upper lobe and
retrocardiac airspace opacities, this could reflect
bronchopneumonia.

Stable cardiomegaly and fullness of the pulmonary hila which may be
seen with pulmonary arterial hypertension.

Small left pleural effusion with fluid tracking along the right
major fissure versus discoid atelectasis.

  By: Dyven Mulet

## 2014-11-18 ENCOUNTER — Encounter: Payer: Self-pay | Admitting: Pharmacist

## 2014-11-27 ENCOUNTER — Encounter: Payer: Self-pay | Admitting: Family Medicine

## 2014-11-27 ENCOUNTER — Non-Acute Institutional Stay: Payer: PRIVATE HEALTH INSURANCE | Admitting: Family Medicine

## 2014-11-27 DIAGNOSIS — R634 Abnormal weight loss: Secondary | ICD-10-CM | POA: Diagnosis not present

## 2014-11-27 DIAGNOSIS — F0390 Unspecified dementia without behavioral disturbance: Secondary | ICD-10-CM | POA: Diagnosis not present

## 2014-11-27 DIAGNOSIS — I4819 Other persistent atrial fibrillation: Secondary | ICD-10-CM

## 2014-11-27 DIAGNOSIS — I481 Persistent atrial fibrillation: Secondary | ICD-10-CM | POA: Diagnosis not present

## 2014-11-27 NOTE — Progress Notes (Signed)
[name], female   DOB: Dec 09, 1932, 81 y.o.   MRN: [id number] [facility]  Visit  Provider: Clare GandyJeremy Schmitz, MD Location of Care: [facility] Visit Information: a scheduled routine follow-up visit Patient accompanied by patient Source(s) of information for visit: patient, nursing home and past medical records  Chief Complaint:  Chief Complaint  Patient presents with  . OTHER    Regulatory Visit    HISTORY OF PRESENT ILLNESS:  Atrial Fibrillation Longstanding issue On lopressor with good rate control No new numbness or weakness of face or limbs noted by staff  Weight loss - Ongoing issue - Variable per oral intake - On nutritional supplements - Being followed by palliative care service in nursing home    Outpatient Encounter Prescriptions as of 11/27/2014  Medication Sig  . acetaminophen (TYLENOL) 500 MG tablet Take 1,000 mg by mouth every 8 (eight) hours as needed for moderate pain.  Marland Kitchen. acetaminophen (TYLENOL) 650 MG CR tablet Take 650 mg by mouth every 6 (six) hours as needed for fever (Temp >101).  Marland Kitchen. albuterol (PROVENTIL) (2.5 MG/3ML) 0.083% nebulizer solution Take 2.5 mg by nebulization every 4 (four) hours as needed for shortness of breath.  . allopurinol (ZYLOPRIM) 300 MG tablet Take 200 mg by mouth daily.   Marland Kitchen. aspirin 81 MG tablet Take 81 mg by mouth daily.  . Cholecalciferol 50000 UNITS capsule Take 50,000 Units by mouth every 30 (thirty) days. Given on 14th of month.  . cycloSPORINE (RESTASIS) 0.05 % ophthalmic emulsion Place 1 drop into both eyes 2 (two) times daily.   . DULoxetine (CYMBALTA) 60 MG capsule Take 30 mg by mouth daily.   Marland Kitchen. gabapentin (NEURONTIN) 300 MG capsule Take 1 capsule (300 mg total) by mouth 3 (three) times daily.  Marland Kitchen. ipratropium-albuterol (DUONEB) 0.5-2.5 (3) MG/3ML SOLN Take 3 mLs by nebulization 3 (three) times daily.  Marland Kitchen. levothyroxine (SYNTHROID, LEVOTHROID) 50 MCG tablet Take 1 tablet (50 mcg total) by mouth  daily.  Marland Kitchen. loratadine (CLARITIN) 10 MG tablet Take 10 mg by mouth daily as needed for rhinitis.   . metoprolol tartrate (LOPRESSOR) 25 MG tablet Take 12.5 mg by mouth 2 (two) times daily.  . Multiple Vitamin (MULTIVITAMIN) tablet Take 1 tablet by mouth daily.   Marland Kitchen. omeprazole (PRILOSEC) 20 MG capsule Take 20 mg by mouth daily.   . polyethylene glycol (GLYCOLAX) packet Take 17 g by mouth every other day. Mix into 8 ounces fluid for constipation  . Propylene Glycol (SYSTANE BALANCE) 0.6 % SOLN Place 1 drop into both eyes 2 (two) times daily.    History Patient Active Problem List   Diagnosis Date Noted  . Gout 07/26/2014  . GERD (gastroesophageal reflux disease) 05/19/2014  . Pulmonary artery hypertension 05/15/2014  . Atrial fibrillation 12/05/2013  . Excessive cerumen in both ear canals 12/05/2013  . Mitral stenosis 10/22/2013  . Weight loss 09/27/2013  . Postural tremor 07/31/2012  . COPD (chronic obstructive pulmonary disease) 12/29/2011  . UNSPECIFIED OPEN-ANGLE GLAUCOMA 08/02/2010  . Chronic pain syndrome 02/18/2010  . Hyperlipidemia 03/12/2009  . Depressive type psychosis 09/12/2008  . TARDIVE DYSKINESIA 08/29/2008  . VITAMIN D DEFICIENCY 08/21/2008  . DEMENTIA, SENILE 07/08/2008  . Person living in residential institution 12/08/2007  . HYPOTHYROIDISM 05/26/2007  . ANEMIA NOS 05/26/2007  . HYPERTENSION, BENIGN ESSENTIAL 05/26/2007   Past Medical History  Diagnosis Date  . Schizophrenia     Rx Seroquel, Aricept, Cymbalta. MMSE 22/30.  Marland Kitchen. Dementia   . Hyperlipidemia   .  Depression   . GERD (gastroesophageal reflux disease)   . Chronic kidney disease (CKD)     Creatinine 1.15 (01/21/11).  . Hypothyroid   . Chronic pain     Dx Gout, Back Pain, Hx PMR - Continues to c/o pain chronically. On Tylenol, Gabapentin, and Oxycontin.  Weaned from prednisone with no change in function. Now in OT 3x/week. NOTE: PATIENT HAS CHRONIC RIGHT HAND PAIN.  Marland Kitchen Hemorrhoids     Rx Miralax, Tucks,  Hydrocortisone.   . Cystitis, chronic     Pt with Hx multiple UTIs. Followed by [facility]. Do not get UA unless systemic symptoms.  . Femur fracture, left     Pt with fall 4/409 with L femur frx; s/p ORIF 02/14/08 by Dr Shon Baton.  No longer on coumadin. Repeat xray of leg per ortho showed separation at repair site. Pt no longer ambulatory. Rx ASA, Calcium, Vitamin D, Bisphosphonate.  . Hypothyroid     Rx Synthroid.  Marland Kitchen Hypertension     Rx Norvasc.  Marland Kitchen Dysphagia   . Fracture, humerus, proximal 08/2009    Non-union  . RESTLESS LEG SYNDROME 05/26/2007  . HIP FRACTURE, LEFT 02/29/2008    Qualifier: Diagnosis of  By: Corliss Marcus MD, MAKEECHA    . PRESSURE ULCER OTHER SITE 06/10/2010    Qualifier: Diagnosis of  By: Sheffield Slider MD, Deniece Portela    . GOUT 05/26/2007  . Hyperkalemia 07/19/2013  . Pain in joint, ankle and foot 02/07/2013  . Polymyalgia rheumatica 05/26/2007  . SPONDYLOSIS, LUMBAR W/MYELOPATHY 05/26/2007  . SPONDYLOSIS, CERVICAL W/MYELOPATHY 05/26/2007    Quadriparetic due to this and lumbar stenosis   . WRIST PAIN, RIGHT 10/08/2009  . DEGENERATIVE JOINT DISEASE, BOTH KNEES, SEVERE 05/26/2007  . HEMORRHOIDS 05/05/2010    Qualifier: Diagnosis of  By: Quincy Carnes    . URINARY TRACT INFECTION, RECURRENT 05/31/2008  . Respiratory failure 12/31/2013  . HCAP (healthcare-associated pneumonia) 12/29/2013  . UNSPECIFIED RENAL FAILURE 11/23/2007    Qualifier: Diagnosis of  By: Clelia Croft MD, Kimberlee  Creatinine Cl calc 68, Creat 1.15 on 12/11/10 Cr 1.15 10/2012 1.85 on 12/18/12 when dehydrated Cr 1.02 on 12/25/12 Cr 1.12 on 01/30/13 Cr 1.02 09/11/14   . Postural tremor 07/31/2012  . UNSPECIFIED OPEN-ANGLE GLAUCOMA 08/02/2010  . VITAMIN D DEFICIENCY 08/21/2008    Ca nml at 10.3 in 10/2012    . OBESITY 05/26/2007    A1c 5.4 on 11/23/11 --> 5.7 in 06/2012    . Insomnia 02/17/2011  . HYPERLIPIDEMIA 03/12/2009    FLP 02/10/12: Chol 146, TG 151 (h), HDL 39 (L), LDL 77. VLDL 30. FLP 03/2012: Chol 144, TG 231, HDL 34, LDL 64 FLP  10/2012: Chol 604, TG 90, HDL 44, LDL 68    . Dry eye syndrome 01/25/2012  . Depressive type psychosis 09/12/2008  . Chronic pain syndrome 02/18/2010    Multifactorial, with difficulty evaluating due to her dementia but Historical contributions from: Polymyalgia Rheumatica cervical and lumbar spondylosis with quadriparesis Gout  right recurrent wrist pain DJD of the bilateral knees. Not a candidate for opioids, Became unresponsive with hypoventilation early Feb of 2014 when switched from Oxycodone to Morphine   . BACK PAIN, CHRONIC 06/14/2007    Qualifier: Diagnosis of  By: Humberto Seals NP, Darl Pikes    . ANEMIA NOS 05/26/2007    Qualifier: Diagnosis of  By: Sheffield Slider MD, Wayne  Hemoglobin 11.4 on 12/11/10 Hgb 12.5, normocytic on 10/2012  11.3 normocytic on 12/18/12 HBG 12.1 08/2013    Past Surgical History  Procedure Laterality Date  . Total abdominal hysterectomy      left  . Orif hip fracture  2009    leftf  . Humeral arthroplasty  08/15/09    left  . Posterior laminectomy / decompression lumbar spine      L 1-2  . Umbilical hernia repair     No family history on file.  reports that she has never smoked. She has never used smokeless tobacco. She reports that she does not drink alcohol or use illicit drugs.  Basic Activities of Daily Living  Dependent or needs assistance in feeding, dressing, bathing and toileting      Falls in the past six months:   no  Diet:  general Supplemental shakes:  yes  Review of Systems  See HPI Pt denies ROS questions but uncertain  PHYSICAL EXAM:. Wt Readings from Last 3 Encounters:  09/23/14 181 lb 3.2 oz (82.192 kg)  07/26/14 188 lb (85.276 kg)  05/19/14 192 lb 3.2 oz (87.181 kg)   Temp Readings from Last 3 Encounters:  11/23/14 97.5 F (36.4 C)   11/15/14 97.1 F (36.2 C)   09/23/14 98.9 F (37.2 C)    BP Readings from Last 3 Encounters:  11/23/14 132/93  11/15/14 142/73  09/23/14 173/104   Pulse Readings from Last 3 Encounters:  11/23/14 73   11/15/14 78  09/23/14 105    General: No acute distress, in wc in room  CV:  RRR, no murmur, no ankle swelling RESP: No resp distress or accessory muscle use.  Clear to ausc bilat. No wheezing, no rales, no rhonchi.  Cor: irr irr, no M / G ABD:  Soft, Non-tender, non-distended, +bowel sounds, no masses   Neurologic:  Alert, focuses on face during interview, social smile. No spontaneous speech, soft voice.  MMSE - Mini Mental State Exam 07/26/2011  Orientation to time 1  Orientation to Place 0  Registration 2  Attention/ Calculation 0  Recall 0  Language- name 2 objects 1  Language- repeat 0  Language- follow 3 step command 3  Language- read & follow direction 1  Write a sentence 0  Copy design 0  Total score 8   1. Atrial Fibrillation - rate controlled. -  Not on anticoagulation due to fall risk.  On antiplt, ASA -  No evidence of new end organ damage.   2. Weight loss - Suspect secondary to Stage 7 dementia - Will stop Cymbalta to see if its Norepinephrine effect could be causing decreased appetite.  Will restart Mirtazipine 7.5 mg qhs.       Assessment and Plan:   See Problem List for individual problem's assessment and plans.   Code Status:    DNR   Follow Up:  Next 60 days unless acute issues arise.

## 2014-12-09 ENCOUNTER — Encounter: Payer: Self-pay | Admitting: Family Medicine

## 2014-12-09 NOTE — Progress Notes (Signed)
This encounter was created in error - please disregard.

## 2014-12-10 ENCOUNTER — Other Ambulatory Visit: Payer: Self-pay | Admitting: Family Medicine

## 2014-12-10 DIAGNOSIS — M109 Gout, unspecified: Secondary | ICD-10-CM

## 2014-12-10 MED ORDER — ALLOPURINOL 100 MG PO TABS: 200.0000 mg | ORAL_TABLET | Freq: Every day | ORAL | Status: AC

## 2014-12-11 ENCOUNTER — Other Ambulatory Visit: Payer: Self-pay | Admitting: Family Medicine

## 2014-12-19 ENCOUNTER — Encounter: Payer: Self-pay | Admitting: Pharmacist

## 2015-01-02 ENCOUNTER — Telehealth: Payer: Self-pay | Admitting: Family Medicine

## 2015-01-07 ENCOUNTER — Encounter: Payer: Self-pay | Admitting: Family Medicine

## 2015-01-07 NOTE — Progress Notes (Signed)
Tammy SitesLaura Scott dropped off pt death cert and needs Dr. McDiarmid to sign it please. Call [phone] when ready for pu / thanks sr

## 2015-01-07 NOTE — Progress Notes (Signed)
Will forward to MD. Jazmin Hartsell,CMA  

## 2015-01-07 NOTE — Telephone Encounter (Signed)
Received a call from [facility], notifying Family Medicine Mrs. Tammy Salinas passed away this afternoon at 4:28. She was  palliative care for her end stage Dementia.  Felix PaciniKuneff, Montrice Montuori DO PGY3 [facility]

## 2015-01-07 DEATH — deceased

## 2015-01-08 ENCOUNTER — Non-Acute Institutional Stay: Payer: Medicaid Other | Admitting: Family Medicine

## 2015-01-08 DIAGNOSIS — F0390 Unspecified dementia without behavioral disturbance: Secondary | ICD-10-CM

## 2015-01-08 NOTE — Progress Notes (Signed)
Form was in blue team's folder.  I have now placed it in your box.  Please return to Tia when completed. Jazmin Hartsell,CMA

## 2015-01-08 NOTE — Progress Notes (Signed)
Death Summary  Patient is an 81 yo female who had been at [facility] for a number of years. Patient was receiving palliative care recently for her advanced dementia. On 2/25 the patient was noted to increasing trouble breathing per [facility] nursing report and work up had been planned by [facility] nurse including CXR and possible antibiotics. Shortly after this it was noted the patient did not have a pulse and was deemed to have died. It is felt that the patients death was related to cardiopulmonary arrest due to dehydration due to poor PO intake due to her dementia. Death certificate will be filled out as it becomes available.   Marikay AlarEric Deionna Marcantonio, MD

## 2015-01-08 NOTE — Progress Notes (Signed)
[name], female   DOB: 1932-12-27, 81 y.o.   MRN: [id number] The death certificate for Tammy Salinas was not in my front office box.  Do you know where it was put?  Death Certificate completed and given to Tia on 01/09/15.

## 2015-01-13 NOTE — Progress Notes (Signed)
[name], female   DOB: October 05, 1933, 81 y.o.   MRN: [id number] I discussed Ms Dieterich's case with Dr Birdie SonsSonnenberg. Natural manner of death. Dehydration <== Inadequate per oral intake <== Dementia, Very Advanced stage causal sequence of death.
